# Patient Record
Sex: Male | Born: 1960 | Race: White | Hispanic: No | Marital: Single | Smoking: Former smoker
Health system: Southern US, Community
[De-identification: ages and names within clinical notes are randomized; demographics above are authoritative.]

## PROBLEM LIST (undated history)

## (undated) DIAGNOSIS — D693 Immune thrombocytopenic purpura: Secondary | ICD-10-CM

## (undated) DIAGNOSIS — T148XXA Other injury of unspecified body region, initial encounter: Secondary | ICD-10-CM

## (undated) DIAGNOSIS — M1990 Unspecified osteoarthritis, unspecified site: Secondary | ICD-10-CM

## (undated) DIAGNOSIS — I10 Essential (primary) hypertension: Principal | ICD-10-CM

## (undated) DIAGNOSIS — G4730 Sleep apnea, unspecified: Secondary | ICD-10-CM

## (undated) DIAGNOSIS — S42002A Fracture of unspecified part of left clavicle, initial encounter for closed fracture: Secondary | ICD-10-CM

## (undated) DIAGNOSIS — R7303 Prediabetes: Secondary | ICD-10-CM

## (undated) DIAGNOSIS — D759 Disease of blood and blood-forming organs, unspecified: Secondary | ICD-10-CM

## (undated) DIAGNOSIS — K219 Gastro-esophageal reflux disease without esophagitis: Secondary | ICD-10-CM

## (undated) DIAGNOSIS — W57XXXA Bitten or stung by nonvenomous insect and other nonvenomous arthropods, initial encounter: Secondary | ICD-10-CM

## (undated) DIAGNOSIS — H9393 Unspecified disorder of ear, bilateral: Secondary | ICD-10-CM

## (undated) DIAGNOSIS — E7800 Pure hypercholesterolemia, unspecified: Secondary | ICD-10-CM

## (undated) DIAGNOSIS — R252 Cramp and spasm: Secondary | ICD-10-CM

## (undated) HISTORY — PX: FRACTURE SURGERY: SHX138

## (undated) HISTORY — DX: Essential (primary) hypertension: I10

## (undated) HISTORY — DX: Prediabetes: R73.03

## (undated) HISTORY — DX: Immune thrombocytopenic purpura: D69.3

---

## 1998-03-16 ENCOUNTER — Ambulatory Visit (HOSPITAL_COMMUNITY): Admission: RE | Admit: 1998-03-16 | Discharge: 1998-03-16 | Payer: Self-pay | Admitting: Gastroenterology

## 2002-02-28 ENCOUNTER — Encounter (INDEPENDENT_AMBULATORY_CARE_PROVIDER_SITE_OTHER): Payer: Self-pay | Admitting: Specialist

## 2002-02-28 ENCOUNTER — Ambulatory Visit (HOSPITAL_COMMUNITY): Admission: RE | Admit: 2002-02-28 | Discharge: 2002-02-28 | Payer: Self-pay | Admitting: Gastroenterology

## 2002-04-27 ENCOUNTER — Encounter: Payer: Self-pay | Admitting: Emergency Medicine

## 2002-04-27 ENCOUNTER — Emergency Department (HOSPITAL_COMMUNITY): Admission: EM | Admit: 2002-04-27 | Discharge: 2002-04-27 | Payer: Self-pay | Admitting: Emergency Medicine

## 2006-10-12 ENCOUNTER — Emergency Department: Payer: Self-pay | Admitting: Emergency Medicine

## 2006-10-12 ENCOUNTER — Other Ambulatory Visit: Payer: Self-pay

## 2006-10-13 ENCOUNTER — Ambulatory Visit: Payer: Self-pay | Admitting: Emergency Medicine

## 2009-04-20 ENCOUNTER — Emergency Department (HOSPITAL_COMMUNITY): Admission: EM | Admit: 2009-04-20 | Discharge: 2009-04-20 | Payer: Self-pay | Admitting: Emergency Medicine

## 2009-08-18 HISTORY — PX: LUMBAR DISC SURGERY: SHX700

## 2009-11-14 ENCOUNTER — Emergency Department: Payer: Self-pay | Admitting: Emergency Medicine

## 2009-11-18 ENCOUNTER — Emergency Department (HOSPITAL_COMMUNITY): Admission: EM | Admit: 2009-11-18 | Discharge: 2009-11-19 | Payer: Self-pay | Admitting: Emergency Medicine

## 2010-07-18 HISTORY — PX: BACK SURGERY: SHX140

## 2010-07-24 ENCOUNTER — Observation Stay (HOSPITAL_COMMUNITY)
Admission: AD | Admit: 2010-07-24 | Discharge: 2010-07-26 | Payer: Self-pay | Source: Home / Self Care | Attending: Specialist | Admitting: Specialist

## 2010-07-25 ENCOUNTER — Encounter (INDEPENDENT_AMBULATORY_CARE_PROVIDER_SITE_OTHER): Payer: Self-pay | Admitting: Specialist

## 2010-09-08 ENCOUNTER — Encounter: Payer: Self-pay | Admitting: Internal Medicine

## 2010-10-29 LAB — URINALYSIS, MICROSCOPIC ONLY
Glucose, UA: NEGATIVE mg/dL
Leukocytes, UA: NEGATIVE
Nitrite: NEGATIVE
Protein, ur: NEGATIVE mg/dL

## 2010-10-29 LAB — PROTIME-INR: INR: 0.95 (ref 0.00–1.49)

## 2010-10-29 LAB — CBC
HCT: 42.2 % (ref 39.0–52.0)
Hemoglobin: 15 g/dL (ref 13.0–17.0)
MCH: 31 pg (ref 26.0–34.0)
MCHC: 35.5 g/dL (ref 30.0–36.0)

## 2010-10-29 LAB — BASIC METABOLIC PANEL
BUN: 9 mg/dL (ref 6–23)
Chloride: 108 mEq/L (ref 96–112)
GFR calc Af Amer: >60 mL/min (ref >60)

## 2010-11-06 LAB — COMPREHENSIVE METABOLIC PANEL
ALT: 31 U/L (ref 0–53)
AST: 32 U/L (ref 0–37)
Albumin: 4.3 g/dL (ref 3.5–5.2)
Alkaline Phosphatase: 69 U/L (ref 39–117)
GFR calc Af Amer: >60 mL/min (ref >60)
Glucose, Bld: 108 mg/dL — ABNORMAL HIGH (ref 70–99)
Potassium: 3.7 mEq/L (ref 3.5–5.1)
Sodium: 139 mEq/L (ref 135–145)
Total Protein: 7 g/dL (ref 6.0–8.3)

## 2010-11-06 LAB — URINALYSIS, ROUTINE W REFLEX MICROSCOPIC
Bilirubin Urine: NEGATIVE
Glucose, UA: NEGATIVE mg/dL
Hgb urine dipstick: NEGATIVE
Ketones, ur: NEGATIVE mg/dL
Protein, ur: NEGATIVE mg/dL
pH: 5.5 (ref 5.0–8.0)

## 2010-11-06 LAB — CBC
Hemoglobin: 14.8 g/dL (ref 13.0–17.0)
Platelets: 209 10*3/uL (ref 150–400)
RDW: 12.3 % (ref 11.5–15.5)

## 2010-12-04 NOTE — Discharge Summary (Signed)
  NAMEOCEAN, SCHILDT                  ACCOUNT NO.:  [id number]  MEDICAL RECORD NO.:  [id number]          PATIENT TYPE:  OBV  LOCATION:  1318                         FACILITY:  [facility]  PHYSICIAN:  Jene Every, M.D.    DATE OF BIRTH:  08/07/1961  DATE OF ADMISSION:  07/24/2010 DATE OF DISCHARGE:  07/26/2010                              DISCHARGE SUMMARY   ADMISSION DIAGNOSES: 1. Herniated nucleus pulposus of L5-S1. 2. Morbid obesity.  DISCHARGE DIAGNOSES: 1. Herniated nucleus pulposus of L5-S1, status post lumbar     decompression and microdiskectomy. 2. Morbid obesity.  PROCEDURE:  The patient was taken to the OR and underwent lateral recess decompression at L5-S1 on the right with microdiskectomy and removal of 3 free fragments at L5-S1 on the right.  Surgeon was Dr. Jene Every. Assistant was [employer], [employer].  Anesthesia was general. Complications were none.  HOSPITAL COURSE:  Uneventful.  CONDITION ON DISCHARGE:  Stable.  FINAL DIAGNOSIS:  Status post decompression microdiskectomy L5-S1.  The patient to follow up with Dr. Shelle Iron in 10 to 14 days postoperatively.  Wound care is to change dressing daily.  ACTIVITY:  Walk as tolerated utilizing back precautions.  MEDICATIONS:  As per med rec sheet.  CONDITION ON DISCHARGE:  Stable.     Roma Schanz, P.A.   ______________________________ Jene Every, M.D.   CS/MEDQ  D:  11/26/2010  T:  11/27/2010  Job:  [id number]  Electronically Signed by Roma Schanz P.A. on 11/27/2010 04:56:36 PM Electronically Signed by Jene Every M.D. on 12/04/2010 12:04:14 PM

## 2011-01-03 NOTE — Procedures (Signed)
[place]  Patient:    [name], [name] Visit Number: [id number] MRN: [id number]          Service Type: END Location: ENDO Attending Physician:  Nelda Marseille Dictated by:   Petra Kuba, M.D. Proc. Date: 02/28/02 Admit Date:  02/28/2002 Discharge Date: 02/28/2002   CC:         [facility]   Procedure Report  PROCEDURE PERFORMED:  Colonoscopy.  ENDOSCOPIST:  Petra Kuba, M.D.  INDICATIONS FOR PROCEDURE:  Patient with a history of colon polyps at a young age, due for repeat screening.  Consent was signed after risks, benefits, methods, and options were thoroughly discussed multiple times in the past with both the patient and his wife.  MEDICATIONS USED:  Demerol 120 mg, Versed 12 mg.  DESCRIPTION OF PROCEDURE:  Rectal inspection was pertinent for external hemorrhoids, small.  Digital exam was negative.  Video colonoscope was inserted, fairly easily advanced around the colon to the cecum.  This did require rolling him on his back and some abdominal pressure.  On insertion no obvious abnormality was seen.  The cecum by the appendiceal orifice and the ileocecal valve.  Prep was adequate.  There was some liquid stool that required washing and suctioning.  On slow withdrawal through the colon, a tiny cecal polyp was seen and was hot biopsied on a setting of 15/15.  The scope was slowly withdrawn to the approximate level of the splenic flexure.  A small pedunculated polyp was seen and snared, electrocautery applied and the polyp was suctioned through the scope and collected in the trap.  Two other descending polyps were seen and were hot biopsied times one and put in the same container with the approximate splenic flexure polyp.  In the more distal sigmoid, another small pedunculated polyp was seen and snared, electrocautery applied and the polyp was suctioned through the scope and collected in the trap.  This was put in a  third container.  Other than the cecal polypectomies, the rest were done on a setting of 20/20.  The scope was slowly withdrawn back to the rectum and retroflexed pertinent for some internal hemorrhoids.  There was a tiny hyperplastic appearing polyp on retroflexion which was hot biopsied times one and put in the third container.  The scope was straightened and readvanced a short ways up the left side of the colon.  Air was suctioned, scope removed.  The patient tolerated the procedure well.  There was no obvious immediate complication.  ENDOSCOPIC DIAGNOSIS: 1. Internal and external hemorrhoids. 2. Two tiny to small polyps with snares in the sigmoid, proximal descending,    hot biopsies in the cecum, rectum, descending times two. 3. Otherwise within normal limits to the cecum.  PLAN:  Await pathology to determine future colonic screening.  Happy to see back p.r.n.  Otherwise return care to [facility] for the customary health care maintenance to include yearly rectals and guaiacs. Dictated by:   Petra Kuba, M.D. Attending Physician:  Nelda Marseille DD:  02/28/02 TD:  03/02/02 Job: [id number]

## 2011-08-08 ENCOUNTER — Observation Stay (HOSPITAL_COMMUNITY)
Admission: EM | Admit: 2011-08-08 | Discharge: 2011-08-09 | DRG: 397 | Disposition: A | Discharge status: Home / Self Care | Payer: BC Managed Care – PPO | Attending: Internal Medicine | Admitting: Internal Medicine

## 2011-08-08 ENCOUNTER — Encounter: Payer: Self-pay | Admitting: *Deleted

## 2011-08-08 ENCOUNTER — Emergency Department (HOSPITAL_COMMUNITY): Payer: BC Managed Care – PPO

## 2011-08-08 ENCOUNTER — Other Ambulatory Visit: Payer: Self-pay

## 2011-08-08 DIAGNOSIS — R5383 Other fatigue: Secondary | ICD-10-CM | POA: Insufficient documentation

## 2011-08-08 DIAGNOSIS — D696 Thrombocytopenia, unspecified: Secondary | ICD-10-CM

## 2011-08-08 DIAGNOSIS — R252 Cramp and spasm: Secondary | ICD-10-CM

## 2011-08-08 DIAGNOSIS — R5381 Other malaise: Secondary | ICD-10-CM | POA: Insufficient documentation

## 2011-08-08 DIAGNOSIS — H9393 Unspecified disorder of ear, bilateral: Secondary | ICD-10-CM

## 2011-08-08 DIAGNOSIS — F17200 Nicotine dependence, unspecified, uncomplicated: Secondary | ICD-10-CM | POA: Diagnosis present

## 2011-08-08 DIAGNOSIS — E782 Mixed hyperlipidemia: Secondary | ICD-10-CM | POA: Diagnosis present

## 2011-08-08 DIAGNOSIS — Z7982 Long term (current) use of aspirin: Secondary | ICD-10-CM | POA: Insufficient documentation

## 2011-08-08 DIAGNOSIS — T148XXA Other injury of unspecified body region, initial encounter: Secondary | ICD-10-CM

## 2011-08-08 DIAGNOSIS — W57XXXA Bitten or stung by nonvenomous insect and other nonvenomous arthropods, initial encounter: Secondary | ICD-10-CM

## 2011-08-08 DIAGNOSIS — Z79899 Other long term (current) drug therapy: Secondary | ICD-10-CM | POA: Insufficient documentation

## 2011-08-08 DIAGNOSIS — R0789 Other chest pain: Secondary | ICD-10-CM | POA: Insufficient documentation

## 2011-08-08 DIAGNOSIS — K219 Gastro-esophageal reflux disease without esophagitis: Secondary | ICD-10-CM | POA: Insufficient documentation

## 2011-08-08 DIAGNOSIS — E785 Hyperlipidemia, unspecified: Secondary | ICD-10-CM | POA: Diagnosis present

## 2011-08-08 DIAGNOSIS — D693 Immune thrombocytopenic purpura: Principal | ICD-10-CM | POA: Diagnosis present

## 2011-08-08 HISTORY — DX: Pure hypercholesterolemia, unspecified: E78.00

## 2011-08-08 HISTORY — DX: Morbid (severe) obesity due to excess calories: E66.01

## 2011-08-08 HISTORY — DX: Other injury of unspecified body region, initial encounter: T14.8XXA

## 2011-08-08 HISTORY — DX: Unspecified osteoarthritis, unspecified site: M19.90

## 2011-08-08 HISTORY — DX: Cramp and spasm: R25.2

## 2011-08-08 HISTORY — DX: Gastro-esophageal reflux disease without esophagitis: K21.9

## 2011-08-08 HISTORY — DX: Bitten or stung by nonvenomous insect and other nonvenomous arthropods, initial encounter: W57.XXXA

## 2011-08-08 HISTORY — DX: Unspecified disorder of ear, bilateral: H93.93

## 2011-08-08 HISTORY — DX: Sleep apnea, unspecified: G47.30

## 2011-08-08 LAB — COMPREHENSIVE METABOLIC PANEL
AST: 17 U/L (ref 0–37)
BUN: 11 mg/dL (ref 6–23)
CO2: 27 mEq/L (ref 19–32)
Calcium: 9.3 mg/dL (ref 8.4–10.5)
Chloride: 104 mEq/L (ref 96–112)
Creatinine, Ser: 0.96 mg/dL (ref 0.50–1.35)
GFR calc non Af Amer: >90 mL/min (ref >90)
Total Bilirubin: 0.3 mg/dL (ref 0.3–1.2)

## 2011-08-08 LAB — DIC (DISSEMINATED INTRAVASCULAR COAGULATION)PANEL
INR: 1.06 (ref 0.00–1.49)
Prothrombin Time: 14 seconds (ref 11.6–15.2)
aPTT: 38 seconds — ABNORMAL HIGH (ref 24–37)

## 2011-08-08 LAB — URINALYSIS, ROUTINE W REFLEX MICROSCOPIC
Bilirubin Urine: NEGATIVE
Ketones, ur: NEGATIVE mg/dL
Leukocytes, UA: NEGATIVE
Nitrite: NEGATIVE
Protein, ur: NEGATIVE mg/dL
pH: 6 (ref 5.0–8.0)

## 2011-08-08 LAB — DIFFERENTIAL
Basophils Absolute: 0 10*3/uL (ref 0.0–0.1)
Basophils Relative: 1 % (ref 0–1)
Eosinophils Relative: 2 % (ref 0–5)
Monocytes Absolute: 0.5 10*3/uL (ref 0.1–1.0)
Monocytes Relative: 6 % (ref 3–12)

## 2011-08-08 LAB — CARDIAC PANEL(CRET KIN+CKTOT+MB+TROPI): Troponin I: <0.3 ng/mL (ref <0.30)

## 2011-08-08 LAB — LACTATE DEHYDROGENASE: LDH: 134 U/L (ref 94–250)

## 2011-08-08 LAB — CBC
HCT: 38.6 % — ABNORMAL LOW (ref 39.0–52.0)
Hemoglobin: 13.2 g/dL (ref 13.0–17.0)
MCHC: 34.2 g/dL (ref 30.0–36.0)
MCV: 87.5 fL (ref 78.0–100.0)
RDW: 12.3 % (ref 11.5–15.5)

## 2011-08-08 LAB — TYPE AND SCREEN: Antibody Screen: NEGATIVE

## 2011-08-08 LAB — PROTIME-INR
INR: 0.98 (ref 0.00–1.49)
Prothrombin Time: 13.2 seconds (ref 11.6–15.2)

## 2011-08-08 LAB — HIV ANTIBODY (ROUTINE TESTING W REFLEX): HIV: NONREACTIVE

## 2011-08-08 MED ORDER — SODIUM CHLORIDE 0.9 % IV SOLN
250.0000 mL | INTRAVENOUS | Status: DC | PRN
  Administered 2011-08-08: 1000 mL via INTRAVENOUS

## 2011-08-08 MED ORDER — ALPRAZOLAM 0.5 MG PO TABS
0.5000 mg | ORAL_TABLET | Freq: Every evening | ORAL | Status: DC | PRN
  Administered 2011-08-08: 0.5 mg via ORAL
  Filled 2011-08-08: qty 1

## 2011-08-08 MED ORDER — SENNOSIDES-DOCUSATE SODIUM 8.6-50 MG PO TABS
1.0000 | ORAL_TABLET | Freq: Every evening | ORAL | Status: DC | PRN
  Filled 2011-08-08: qty 1

## 2011-08-08 MED ORDER — SODIUM CHLORIDE 0.9 % IJ SOLN
3.0000 mL | Freq: Two times a day (BID) | INTRAMUSCULAR | Status: DC
  Administered 2011-08-08 – 2011-08-09 (×2): 3 mL via INTRAVENOUS

## 2011-08-08 MED ORDER — PREDNISONE 50 MG PO TABS
100.0000 mg | ORAL_TABLET | Freq: Every day | ORAL | Status: DC
  Administered 2011-08-08 – 2011-08-09 (×2): 100 mg via ORAL
  Filled 2011-08-08 (×3): qty 2

## 2011-08-08 MED ORDER — PANTOPRAZOLE SODIUM 40 MG PO TBEC
40.0000 mg | DELAYED_RELEASE_TABLET | Freq: Every day | ORAL | Status: DC
  Administered 2011-08-08 – 2011-08-09 (×2): 40 mg via ORAL
  Filled 2011-08-08 (×3): qty 1

## 2011-08-08 MED ORDER — SODIUM CHLORIDE 0.9 % IJ SOLN: 3.0000 mL | INTRAMUSCULAR | Status: DC | PRN

## 2011-08-08 MED ORDER — ONDANSETRON HCL 4 MG/2ML IJ SOLN: 4.0000 mg | Freq: Four times a day (QID) | INTRAMUSCULAR | Status: DC | PRN

## 2011-08-08 MED ORDER — ACETAMINOPHEN 325 MG PO TABS: 650.0000 mg | ORAL_TABLET | Freq: Four times a day (QID) | ORAL | Status: DC | PRN

## 2011-08-08 MED ORDER — ROSUVASTATIN CALCIUM 40 MG PO TABS
40.0000 mg | ORAL_TABLET | Freq: Every day | ORAL | Status: DC
  Administered 2011-08-08: 40 mg via ORAL
  Filled 2011-08-08 (×4): qty 1

## 2011-08-08 MED ORDER — OXYCODONE HCL 5 MG PO TABS: 5.0000 mg | ORAL_TABLET | ORAL | Status: DC | PRN

## 2011-08-08 MED ORDER — ONDANSETRON HCL 4 MG PO TABS: 4.0000 mg | ORAL_TABLET | Freq: Four times a day (QID) | ORAL | Status: DC | PRN

## 2011-08-08 MED ORDER — ACETAMINOPHEN 650 MG RE SUPP: 650.0000 mg | Freq: Four times a day (QID) | RECTAL | Status: DC | PRN

## 2011-08-08 NOTE — ED Notes (Signed)
Pt states that he has noticed an increase in his fatigue in the last month or so. He states he hunts in the am, and that he never has taken naps, but recently he states he falls asleep in the late afternoon which is out of character. He works in the [employer].

## 2011-08-08 NOTE — Consult Note (Signed)
Reason for consult:Thrombocytopenia, r/o ITP Consulting MD: Dr. Gaylyn Rong Date of Consultation: 08/08/2011  HPI: [name] is a 50 year old white man with past medical history only significant for hyperlipidemia, called today by his PCP office, [name]  to come to the emergency department after he was found to have a platelet count of 16,000. (prior plt count per pt report was 156) Of note, pt had been seen recently for a "ear infection", and "pain in the right neck with a lump that went away" along with "right ear bleeding after using a Q-tip" for which he states he received steroids. He does not recall if antibiotics were given at the time.He has been  has been more fatigued recently and has noticed several large bruises in the extremities over the last 2 months.  He states that 3 weeks ago while he was out hunting dear he cut his left forearm and bled for 3 days. Pt has experienced several ticks over the last few moths during hunting, most recently over 4 weeks ago.  Denies any fever or chills. No night sweats.  No new areas of swelling noted by patient. He does have a 100 lb intentional weight loss over the last year. Occasional ETOH binger, last consumption 12/20, approximately 12 pack.  Pt is being seen at the ED for evaluation of low platelet count. Of note, patient takes daily baby ASA, intermittent Advil. Denies any risk for hepatitis or HIV (has never been tested)  Smear shows no abnormalities, in particular no schistocytes.    PMH: Past Medical History  Diagnosis Date  . Acid reflux   . Hypercholesteremia   . Morbid obesity   . Bruise 08/08/11    pt states brusing x 1 month  . Tick bites 08/08/11    60 to 70 ticks in last hunting season , last > 1 month  . 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 Chest pain 08/08/2011    "a little in last month"  . Leg cramps 08/08/2011    bad in last 1.5 months    Surgeries: Past  Surgical History  Procedure Date  . Back surgery 07/2010    lumbar - 3 discs    Allergies:  Allergies  Allergen Reactions  . Penicillins Other (See Comments)    Unknown childhood allergy    Medications:   Scheduled:   . pantoprazole  40 mg Oral Q1200  . rosuvastatin  40 mg Oral Daily  . sodium chloride  3 mL Intravenous Q12H   WUJ:WJXBJY chloride, acetaminophen, acetaminophen, ondansetron (ZOFRAN) IV, ondansetron, oxyCODONE, senna-docusate, sodium chloride  NWG:NFAOZH of Systems  Constitutional: Positive for weight loss 100 lbs, intentional. Negative for fever, chills and malaise. More fatigued than usual.  Eyes: Negative for blurred vision and double vision.  Respiratory: Negative for cough, hemoptysis and shortness of breath.  Cardiovascular: Negative for chest pain. GI: No nausea, vomiting, diarrhea, constipation. No change in bowel caliber. No  Melena or  Hematochezia.  GU: No blood in urine. No loss of urinary control. Skin: Negative for itching. No rash. No petechia.Bruising as in HPI Neurological: No headaches. No motor or sensory deficits.  Rest of the positive ROS as per HPI   Family History: Family History  Problem Relation Age of Onset  . Uterine cancer Mother   . Uterine cancer Sister   Father alive, "easy bruising"  Social History:  reports that he has  been smoking about 1 ppd "on and off" .  His smokeless tobacco use includes Chew. He reports that he drinks alcohol, last binge with 12 ppack on 12/20. He reports that he does not use illicit drugs. Pt divorced. No children. One partner relationship. Unprotected sex. Total of 18 siblings.   Physical Exam  50 y.o. male in no acute distress  A. and O. x3 HEENT: Normocephalic, atraumatic, PERRLA. Oral cavity without thrush or lesions.Wears dentures. Neck: supple. no thyromegaly,R shotty cervical l.n. No supraclavicular adenopathy  Lungs: clear bilaterally . No wheezing, rhonchi or rales. No axillary  masses. Breasts: not examined. Cardiac: regular rate and rhythm normal S1-S2, no murmur , rubs or gallops Abdomen: soft nontender , bowel sounds x4  no hepatosplenomegaly GU/rectal: deferred. Extremities: no clubbing cyanosis . No edema. R thigh large 8 by 6 cm bruise. No other bruising visible.  Neurologic: A. and O. x3, no focal deficits       Labs  CBC  Lab 08/08/11 1245  WBC 8.1  HGB 13.2  HCT 38.6*  PLT 19*  MCV 87.5  MCH 29.9  MCHC 34.2  RDW 12.3  LYMPHSABS 1.9  MONOABS 0.5  EOSABS 0.2  BASOSABS 0.0  BANDABS --    CMP    Lab 08/08/11 1245  NA 139  K 4.2  CL 104  CO2 27  GLUCOSE 83  BUN 11  CREATININE 0.96  CALCIUM 9.3  MG --  AST 17  ALT 20  ALKPHOS 97  BILITOT 0.3        Component Value Date/Time   BILITOT 0.3 08/08/2011 1245     Lab 08/08/11 1245  INR 0.98  PROTIME --    Imaging Studies: Dg Chest 2 View  08/08/2011  *RADIOLOGY REPORT*  Clinical Data: 50 year old male with weakness.  CHEST - 2 VIEW  Comparison: 07/24/2010 and earlier.  Findings: Improved lung volumes.  Cardiac size and mediastinal contours are within normal limits.  Visualized tracheal air column is within normal limits.  No pneumothorax, pulmonary edema, pleural effusion or confluent pulmonary opacity.  Negative visualized bowel gas pattern. No acute osseous abnormality identified.  IMPRESSION: No acute cardiopulmonary abnormality.  Original Report Authenticated By: Harley Hallmark, M.D.       A/P: 50 y.o. male asked to see for evaluation of thrombocytopenia, likely ITP . Workup in progress. Dr.  Gaylyn Rong   is to see the patient following this consult with recommendations regarding diagnosis, treatment options and further workup studies.    [facility] E 08/08/2011 4:22 PM  ================================  Attending's Attestation.  ATTENDING'S ATTESTATION:  I personally reviewed patient's chart, examined patient myself, formulated the treatment plan as followed.      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  Another much less likely possibility includes recent antibiotics use (x2); with last dose less than a month ago.   His WBC/Hgb are normal as are his CMET.  He has unprotected sexual intercourse with only one male partner.  I personally reviewed the patient's peripheral blood smear today.  There was isocytosis.  There was no peripheral blast.  There was no schistocytosis, spherocytosis, target cell, rouleaux formation, tear drop cell.  There was no giant platelets or platelet clumps.   There was very low platelet counts.   He has lost about 100-lb intentionally over the past year.  He however has no B symptoms.  The subjective easy bruisability does not have clinical correlate since he had extensive back surgery 07/2010 without bleeding diathesis. He claimed recent right  cervical neck node swelling which was most likely related to his right ear infection.  There was no adenopathy on exam today. He drinks EtOH occasionally but no LFT abnormality or splenomegaly to suggest portal hypertension. I have low clinical suspicion for primary bone marrow failure state with isolated thrombocytopenia and no B symptoms.  There is no concern for MAHA/TTP on clinical/lab/blood smear review.  Diagnostic bone marrow at this time is of low clinical utility unless he does not improve with emperic treatment for ITP.   RECOMMEND: -  I sent for HIV/Hep B/Hep C/Vit B12/folate.  I sent for PTT/DIC panel to complete work up due to his report of easy bruisability.  - Start Prednisone 1mg /kg (calculated to be 100mg ) PO daily.  I discussed with patient side effects of Prednisone which include but not limited to fluid retention, insomnia, GERD, PUD and GI bleeding.  If he has no response to Prednisone within 4-5 days, other measures such as IVIg or Rituxan maybe consdered.  - Keep inpatient status until Plt improves around 50. - When D/C; please contact [facility] for follow up.  Thank you  for this consultation.

## 2011-08-08 NOTE — ED Notes (Signed)
ZOX:[id number]<AV> Expected date:<BR> Expected time:<BR> Means of arrival:<BR> Comments:<BR> [name] coming from home md office called and pt has low plts of 16 private vehicle

## 2011-08-08 NOTE — ED Notes (Signed)
Pt sts he is here for low platelet (value is 16). Pt is eating right now, nad noted. ABC intact. Denied pain. No sob. Alert, and oriented. Will continue to monitor.

## 2011-08-08 NOTE — ED Provider Notes (Signed)
History     CSN: [id number]  Arrival date & time 08/08/11  1131   First MD Initiated Contact with Patient 08/08/11 1215      Chief Complaint  Patient presents with  . Abnormal Lab    low platelets    (Consider location/radiation/quality/duration/timing/severity/associated sxs/prior treatment) HPI Comments: Patient presents with abnormal lab value low platelets. He visited his doctor yesterday for annual physical and management he's been having easy bruising for about the past week.  He was found to have platelet level of 16.   She denies any gingival bleeding, blood in his stool, blood in his urine. Denies abdominal pain, chest pain, nausea or vomiting. Recently he has had increasing fatigue over the past month and taking naps which she doesn't usually do.   Is not take any medications other than his cholesterol and stomach pill.  The history is provided by the patient.    Past Medical History  Diagnosis Date  . Acid reflux   . Hypercholesteremia   . Morbid obesity   . Bruise 08/08/11    pt states brusing x 1 month  . Tick bites 08/08/11    60 to 70 ticks in last hunting season , last > 1 month  . 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 Chest pain 08/08/2011    "a little in last month"  . Leg cramps 08/08/2011    bad in last 1.5 months    Past Surgical History  Procedure Date  . Back surgery 07/2010    lumbar - 3 discs    Family History  Problem Relation Age of Onset  . Uterine cancer Mother   . Uterine cancer Sister     History  Substance Use Topics  . Smoking status: Current Everyday Smoker -- 0.5 packs/day for 2 years  . Smokeless tobacco: Current User    Types: Chew  . Alcohol Use: Yes     drank 12 beers last night on 08/07/11, pt states rarely drinks      Review of Systems  Constitutional: Positive for fatigue. Negative for fever, activity change and appetite change.  HENT: Negative  for congestion and rhinorrhea.   Eyes: Negative for visual disturbance.  Respiratory: Negative for cough and shortness of breath.   Cardiovascular: Negative for chest pain.  Gastrointestinal: Negative for nausea, vomiting and abdominal pain.  Genitourinary: Negative for dysuria and hematuria.  Musculoskeletal: Negative for arthralgias and gait problem.  Skin: Positive for wound.  Neurological: Positive for weakness. Negative for dizziness and headaches.    Allergies  Penicillins  Home Medications   Current Outpatient Rx  Name Route Sig Dispense Refill  . XANAX PO Oral Take 1 mg by mouth 3 (three) times daily as needed.     . ASPIRIN EC 81 MG PO TBEC Oral Take 81 mg by mouth daily.      . ATORVASTATIN CALCIUM 80 MG PO TABS Oral Take 80 mg by mouth daily.      Marland Kitchen NAPROXEN SODIUM 220 MG PO TABS Oral Take 220 mg by mouth 2 (two) times daily with a meal.      . OMEPRAZOLE 40 MG PO CPDR Oral Take 40 mg by mouth daily.        BP 117/73  Pulse 62  Temp(Src) 97.7 F (36.5 C) (Oral)  Resp 20  Wt 218 lb (98.884 kg)  SpO2 99%  Physical Exam  Constitutional:  He is oriented to person, place, and time. He appears well-developed and well-nourished. No distress.  HENT:  Head: Normocephalic and atraumatic.  Mouth/Throat: Oropharynx is clear and moist. No oropharyngeal exudate.       No oral lesions, no gingival bleeding  Eyes: Conjunctivae are normal. Pupils are equal, round, and reactive to light.  Neck: Normal range of motion. Neck supple.  Cardiovascular: Normal rate, regular rhythm and normal heart sounds.   Pulmonary/Chest: Effort normal and breath sounds normal. No respiratory distress.  Abdominal: Soft. There is no tenderness. There is no rebound and no guarding.  Musculoskeletal: Normal range of motion. He exhibits no edema and no tenderness.  Neurological: He is alert and oriented to person, place, and time. No cranial nerve deficit.  Skin: Skin is warm.       Bruise to right  anterior thigh that is at least a week old No petechiae    ED Course  Procedures (including critical care time)  Labs Reviewed  CBC - Abnormal; Notable for the following:    HCT 38.6 (*)    Platelets 19 (*)    All other components within normal limits  DIC PANEL - Abnormal; Notable for the following:    aPTT 38 (*)    Platelets 19 (*)    All other components within normal limits  DIFFERENTIAL  COMPREHENSIVE METABOLIC PANEL  PROTIME-INR  URINALYSIS, ROUTINE W REFLEX MICROSCOPIC  CARDIAC PANEL(CRET KIN+CKTOT+MB+TROPI)  TYPE AND SCREEN  LACTATE DEHYDROGENASE  HIV ANTIBODY (ROUTINE TESTING)  HEPATITIS PANEL, ACUTE  ANA  PATHOLOGIST SMEAR REVIEW  BASIC METABOLIC PANEL  CBC  LIPID PANEL  VITAMIN B12  FOLATE  HEPATITIS PANEL, ACUTE  HIV ANTIBODY (ROUTINE TESTING)   Dg Chest 2 View  08/08/2011  *RADIOLOGY REPORT*  Clinical Data: 50 year old male with weakness.  CHEST - 2 VIEW  Comparison: 07/24/2010 and earlier.  Findings: Improved lung volumes.  Cardiac size and mediastinal contours are within normal limits.  Visualized tracheal air column is within normal limits.  No pneumothorax, pulmonary edema, pleural effusion or confluent pulmonary opacity.  Negative visualized bowel gas pattern. No acute osseous abnormality identified.  IMPRESSION: No acute cardiopulmonary abnormality.  Original Report Authenticated By: Ulla Potash III, M.D.     1. Thrombocytopenia       MDM  Thrombocytopenia without any active bleeding. No history of alcohol abuse, liver problems, kidney problems.  No petechiae on exam. Very well-appearing on exam.  Labs today confirm thrombocytopenia. Normal hemoglobin, normal coags.  Concern for ITP. Normal renal function and normal mental status do not suggest TTP.  D/w Dr. Gaylyn Rong of hematology who will consult on patient.  Admit to triad, Dr. Ardyth Harps.      Date: 08/08/2011  Rate: 70  Rhythm: normal sinus rhythm  QRS Axis: normal  Intervals: normal  ST/T  Wave abnormalities: normal  Conduction Disutrbances:none  Narrative Interpretation:   Old EKG Reviewed: none available    Glynn Octave, MD 08/08/11 1918

## 2011-08-08 NOTE — H&P (Addendum)
PCP:   Nadean Corwin, MD, MD   Chief Complaint:  Bruising and low platelet count.  HPI: Patient is a very pleasant 50 year old white man with past medical history only significant for hyperlipidemia, who went to see his primary care physician yesterday for his six-month physical and was called today to come to the emergency department because his platelet count was 16,000. Upon further questioning he states that he has been more fatigued recently and that he has had several bruises without injury. He did note that about 2 months ago he started bleeding out of his ear after he used a Q-tip and also 3 weeks ago while he was out hunting dear he cut his left forearm and bled for 3 days. Because of his platelet count we are asked to admit him for further evaluation.  Allergies:   Allergies  Allergen Reactions  . Penicillins Other (See Comments)    Unknown childhood allergy      Past Medical History  Diagnosis Date  . Acid reflux   . Hypercholesteremia   . Morbid obesity     Past Surgical History  Procedure Date  . Back surgery 07/2010    lumbar    Prior to Admission medications   Medication Sig Start Date End Date Taking? Authorizing Provider  ALPRAZolam (XANAX PO) Take 1 mg by mouth 3 (three) times daily as needed.    Yes Historical Provider, MD  aspirin EC 81 MG tablet Take 81 mg by mouth daily.     Yes Historical Provider, MD  atorvastatin (LIPITOR) 80 MG tablet Take 80 mg by mouth daily.     Yes Historical Provider, MD  naproxen sodium (ANAPROX) 220 MG tablet Take 220 mg by mouth 2 (two) times daily with a meal.     Yes Historical Provider, MD  omeprazole (PRILOSEC) 40 MG capsule Take 40 mg by mouth daily.     Yes Historical Provider, MD    Social History:  reports that he has been smoking.  He does not have any smokeless tobacco history on file. He reports that he drinks alcohol. He reports that he does not use illicit drugs.  History reviewed. No pertinent family  history.  Review of Systems:  Negative except as mentioned in history of present illness.  Physical Exam: Blood pressure 115/72, pulse 81, temperature 98 F (36.7 C), temperature source Oral, resp. rate 17, weight 98.884 kg (218 lb), SpO2 97.00%. General: Alert, awake, oriented x3, in no acute distress. HEENT: Normocephalic, atraumatic, pupils equal round reactive to light, intact extraocular movements, he has no natural teeth. Neck: Supple, no JVD, no lymphadenopathy, no bruits, no goiter. Cardiovascular: Regular rate and rhythm, no murmurs, rubs or gallops. Lungs: Clear to auscultation bilaterally. Abdomen: Soft, nontender, nondistended, positive bowel sounds, no masses or organomegaly noted. Extremities: No clubbing, cyanosis or edema, positive pedal pulses. Neurologic: Grossly intact and nonfocal. Skin: he has multiple bruises in different phases of healing over her right shoulder right thigh left inner calf.  Labs on Admission:  Results for orders placed during the hospital encounter of 08/08/11 (from the past 48 hour(s))  CBC     Status: Abnormal   Collection Time   08/08/11 12:45 PM      Component Value Range Comment   WBC 8.1  4.0 - 10.5 (K/uL)    RBC 4.41  4.22 - 5.81 (MIL/uL)    Hemoglobin 13.2  13.0 - 17.0 (g/dL)    HCT 95.2 (*) 84.1 - 52.0 (%)  MCV 87.5  78.0 - 100.0 (fL)    MCH 29.9  26.0 - 34.0 (pg)    MCHC 34.2  30.0 - 36.0 (g/dL)    RDW 16.1  09.6 - 04.5 (%)    Platelets 19 (*) 150 - 400 (K/uL)   DIFFERENTIAL     Status: Normal   Collection Time   08/08/11 12:45 PM      Component Value Range Comment   Neutrophils Relative 69  43 - 77 (%)    Neutro Abs 5.5  1.7 - 7.7 (K/uL)    Lymphocytes Relative 23  12 - 46 (%)    Lymphs Abs 1.9  0.7 - 4.0 (K/uL)    Monocytes Relative 6  3 - 12 (%)    Monocytes Absolute 0.5  0.1 - 1.0 (K/uL)    Eosinophils Relative 2  0 - 5 (%)    Eosinophils Absolute 0.2  0.0 - 0.7 (K/uL)    Basophils Relative 1  0 - 1 (%)     Basophils Absolute 0.0  0.0 - 0.1 (K/uL)   COMPREHENSIVE METABOLIC PANEL     Status: Normal   Collection Time   08/08/11 12:45 PM      Component Value Range Comment   Sodium 139  135 - 145 (mEq/L)    Potassium 4.2  3.5 - 5.1 (mEq/L)    Chloride 104  96 - 112 (mEq/L)    CO2 27  19 - 32 (mEq/L)    Glucose, Bld 83  70 - 99 (mg/dL)    BUN 11  6 - 23 (mg/dL)    Creatinine, Ser 4.09  0.50 - 1.35 (mg/dL)    Calcium 9.3  8.4 - 10.5 (mg/dL)    Total Protein 6.5  6.0 - 8.3 (g/dL)    Albumin 3.7  3.5 - 5.2 (g/dL)    AST 17  0 - 37 (U/L)    ALT 20  0 - 53 (U/L)    Alkaline Phosphatase 97  39 - 117 (U/L)    Total Bilirubin 0.3  0.3 - 1.2 (mg/dL)    GFR calc non Af Amer >90  >90 (mL/min)    GFR calc Af Amer >90  >90 (mL/min)   PROTIME-INR     Status: Normal   Collection Time   08/08/11 12:45 PM      Component Value Range Comment   Prothrombin Time 13.2  11.6 - 15.2 (seconds)    INR 0.98  0.00 - 1.49    CARDIAC PANEL(CRET KIN+CKTOT+MB+TROPI)     Status: Normal   Collection Time   08/08/11 12:45 PM      Component Value Range Comment   Total CK 92  7 - 232 (U/L)    CK, MB 2.5  0.3 - 4.0 (ng/mL)    Troponin I <0.30  <0.30 (ng/mL)    Relative Index RELATIVE INDEX IS INVALID  0.0 - 2.5    TYPE AND SCREEN     Status: Normal   Collection Time   08/08/11 12:45 PM      Component Value Range Comment   ABO/RH(D) O POS      Antibody Screen NEG      Sample Expiration 08/11/2011     URINALYSIS, ROUTINE W REFLEX MICROSCOPIC     Status: Normal   Collection Time   08/08/11  1:33 PM      Component Value Range Comment   Color, Urine YELLOW  YELLOW     APPearance CLEAR  CLEAR  Specific Gravity, Urine 1.013  1.005 - 1.030     pH 6.0  5.0 - 8.0     Glucose, UA NEGATIVE  NEGATIVE (mg/dL)    Hgb urine dipstick NEGATIVE  NEGATIVE     Bilirubin Urine NEGATIVE  NEGATIVE     Ketones, ur NEGATIVE  NEGATIVE (mg/dL)    Protein, ur NEGATIVE  NEGATIVE (mg/dL)    Urobilinogen, UA 1.0  0.0 - 1.0 (mg/dL)     Nitrite NEGATIVE  NEGATIVE     Leukocytes, UA NEGATIVE  NEGATIVE  MICROSCOPIC NOT DONE ON URINES WITH NEGATIVE PROTEIN, BLOOD, LEUKOCYTES, NITRITE, OR GLUCOSE <1000 mg/dL.    Radiological Exams on Admission: Dg Chest 2 View  08/08/2011  *RADIOLOGY REPORT*  Clinical Data: 50 year old male with weakness.  CHEST - 2 VIEW  Comparison: 07/24/2010 and earlier.  Findings: Improved lung volumes.  Cardiac size and mediastinal contours are within normal limits.  Visualized tracheal air column is within normal limits.  No pneumothorax, pulmonary edema, pleural effusion or confluent pulmonary opacity.  Negative visualized bowel gas pattern. No acute osseous abnormality identified.  IMPRESSION: No acute cardiopulmonary abnormality.  Original Report Authenticated By: Harley Hallmark, M.D.    Assessment/Plan Principal Problem:  *Thrombocytopenia Active Problems:  Hyperlipidemia   #1 thrombocytopenia: He does not have anemia or leukopenia. He has no findings concerning for TTP. We'll order an LDH and a peripheral blood smear. Will also add an HIV, hepatitis C and ANA. 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 Gaylyn Rong with hematology has already been consulted by the emergency department physician and will be in to see patient. Will leave further recommendations to him.  #2 hyperlipidemia: Check fasting lipid panel, continue statin.  #3 DVT prophylaxis: SCDs.  Time Spent on Admission: 40 minutes.  Chaya Jan [facility] Pager: [phone] 08/08/2011, 3:53 PM

## 2011-08-08 NOTE — ED Notes (Signed)
Family at bedside. 

## 2011-08-08 NOTE — ED Notes (Signed)
Vital signs stable. 

## 2011-08-08 NOTE — ED Notes (Signed)
Patient denies pain and is resting comfortably.  

## 2011-08-08 NOTE — ED Notes (Signed)
Pt states "I've been bruising easily, my doctor called me this morning & told me I had low platelets & come to the hospital"

## 2011-08-08 NOTE — ED Notes (Signed)
Patient is resting comfortably. 

## 2011-08-09 ENCOUNTER — Other Ambulatory Visit: Payer: Self-pay | Admitting: Oncology

## 2011-08-09 DIAGNOSIS — E785 Hyperlipidemia, unspecified: Secondary | ICD-10-CM

## 2011-08-09 DIAGNOSIS — D696 Thrombocytopenia, unspecified: Secondary | ICD-10-CM

## 2011-08-09 LAB — BASIC METABOLIC PANEL
CO2: 22 mEq/L (ref 19–32)
Calcium: 9.4 mg/dL (ref 8.4–10.5)
GFR calc Af Amer: >90 mL/min (ref >90)
GFR calc non Af Amer: >90 mL/min (ref >90)
Sodium: 136 mEq/L (ref 135–145)

## 2011-08-09 LAB — HEPATITIS PANEL, ACUTE
HCV Ab: NEGATIVE
Hep A IgM: NEGATIVE
Hep B C IgM: NEGATIVE

## 2011-08-09 LAB — CBC
MCV: 86.6 fL (ref 78.0–100.0)
Platelets: 24 10*3/uL — CL (ref 150–400)
RBC: 4.48 MIL/uL (ref 4.22–5.81)
WBC: 9.2 10*3/uL (ref 4.0–10.5)

## 2011-08-09 LAB — LIPID PANEL
Cholesterol: 97 mg/dL (ref 0–200)
HDL: 33 mg/dL — ABNORMAL LOW (ref >39)
Total CHOL/HDL Ratio: 2.9 RATIO
Triglycerides: 44 mg/dL (ref <150)

## 2011-08-09 LAB — ABO/RH: ABO/RH(D): O POS

## 2011-08-09 MED ORDER — PREDNISONE 50 MG PO TABS: 100.0000 mg | ORAL_TABLET | Freq: Every day | ORAL | Status: DC

## 2011-08-09 NOTE — Progress Notes (Signed)
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 Feeling well,no complaints,no feversetc.  Objective: Temp:  [96.8 F (36 C)-98.5 F (36.9 C)] 98 F (36.7 C) (12/22 0542) Pulse Rate:  [62-88] 88  (12/22 0542) Resp:  [16-20] 20  (12/22 0542) BP: (115-135)/(57-83) 135/79 mmHg (12/22 0542) SpO2:  [96 %-99 %] 96 % (12/22 0542) Weight:  [218 lb (98.884 kg)] 218 lb (98.884 kg) (12/21 1955)  General appearance: alert, cooperative and appears stated age Throat: lips, mucosa, and tongue normal; teeth and gums normal Resp: clear to auscultation bilaterally and normal percussion bilaterally Cardio: regular rate and rhythm, S1, S2 normal, no murmur, click, rub or gallop and normal apical impulse Extremities: extremities normal, atraumatic, no cyanosis or edema  Labs:  [facility] 08/09/11 0342 08/08/11 1745 08/08/11 1245  WBC 9.2 -- 8.1  HGB 13.5 -- 13.2  HCT 38.8* -- 38.6*  PLT 24* 19* --    Basename 08/09/11 0342 08/08/11 1245  NA 136 139  K 4.2 4.2  CL 104 104  CO2 22 27  GLUCOSE 138* 83  BUN 16 11  CREATININE 0.81 0.96  CALCIUM 9.4 9.3    Results for orders placed during the hospital encounter of 07/24/10  MRSA PCR SCREENING     Status: Normal   Collection Time   07/24/10 10:54 PM      Component Value Range Status Comment   MRSA by PCR    NEGATIVE  Final    Value: NEGATIVE            The GeneXpert MRSA Assay (FDA     approved for NASAL specimens     only), is one component of a     comprehensive MRSA colonization     surveillance program. It is not     intended to diagnose MRSA     infection nor to guide or     monitor treatment for     MRSA infections.     Dg Chest 2 View  08/08/2011  *RADIOLOGY REPORT*  Clinical Data: 50 year old male with weakness.  CHEST - 2 VIEW  Comparison: 07/24/2010 and earlier.  Findings: Improved lung volumes.  Cardiac size and mediastinal contours are within normal limits.   Visualized tracheal air column is within normal limits.  No pneumothorax, pulmonary edema, pleural effusion or confluent pulmonary opacity.  Negative visualized bowel gas pattern. No acute osseous abnormality identified.  IMPRESSION: No acute cardiopulmonary abnormality.  Original Report Authenticated By: Harley Hallmark, M.D.  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

## 2011-08-09 NOTE — Progress Notes (Signed)
Pt discharged to home. Discharge instructions reviewed with pt who verbalized understanding, new RX given to pt.

## 2011-08-09 NOTE — Discharge Summary (Signed)
Physician Discharge Summary  Patient ID: [name] MRN: [id number] DOB/AGE: 51/12/1960 51 y.o.  Admit date: 08/08/2011 Discharge date: 08/09/2011  Primary Care Physician:  Nadean Corwin, MD, MD   Discharge Diagnoses:    Principal Problem:  *Thrombocytopenia Active Problems:  Hyperlipidemia  Immune thrombocytopenic purpura    Current Discharge Medication List    START taking these medications   Details  predniSONE (DELTASONE) 50 MG tablet Take 2 tablets (100 mg total) by mouth daily. Qty: 20 tablet, Refills: 0   Associated Diagnoses: Thrombocytopenia      CONTINUE these medications which have NOT CHANGED   Details  ALPRAZolam (XANAX PO) Take 1 mg by mouth 3 (three) times daily as needed.     atorvastatin (LIPITOR) 80 MG tablet Take 80 mg by mouth daily.      omeprazole (PRILOSEC) 40 MG capsule Take 40 mg by mouth daily.        STOP taking these medications     aspirin EC 81 MG tablet      naproxen sodium (ANAPROX) 220 MG tablet        Discharge Exam: Blood pressure 135/79, pulse 88, temperature 98 F (36.7 C), temperature source Oral, resp. rate 20, height 5\' 10"  (1.778 m), weight 98.884 kg (218 lb), SpO2 96.00%. NAD CTA B S1, S2, RRR Soft, NT, BS+ No edema b/l  Disposition and Follow-up:  Patient will follow up with Dr. Gaylyn Rong next week, follow up with primary doctor as needed.  Consults:  Hematology, Dr. Ha/Dr. Donnie Coffin   Significant Diagnostic Studies:  Dg Chest 2 View  08/08/2011  *RADIOLOGY REPORT*  Clinical Data: 50 year old male with weakness.  CHEST - 2 VIEW  Comparison: 07/24/2010 and earlier.  Findings: Improved lung volumes.  Cardiac size and mediastinal contours are within normal limits.  Visualized tracheal air column is within normal limits.  No pneumothorax, pulmonary edema, pleural effusion or confluent pulmonary opacity.  Negative visualized bowel gas pattern. No acute osseous abnormality identified.  IMPRESSION: No acute  cardiopulmonary abnormality.  Original Report Authenticated By: Harley Hallmark, M.D.    Brief H and P: For complete details please refer to admission H and P, but in brief Patient is a very pleasant 50 year old white man with past medical history only significant for hyperlipidemia, who went to see his primary care physician yesterday for his six-month physical and was called today to come to the emergency department because his platelet count was 16,000. Upon further questioning he states that he has been more fatigued recently and that he has had several bruises without injury. He did note that about 2 months ago he started bleeding out of his ear after he used a Q-tip and also 3 weeks ago while he was out hunting [place] he cut his left forearm and bled for 3 days. Because of his platelet count we are asked to admit him for further evaluation.     Hospital Course:   Immune thrombocytopenic purpura Patient was admitted to the hospital due to findings of low platelets.  On admission his platelets were found to be 16K.  The remainder of his blood work was unremarkable.  He was initially seen by Dr. Gaylyn Rong, and appropriate work up was sent.  It was felt that his thrombocytopenia was likely due to ITP.  He was started on prednisone 100mg  daily.  The following day his platelets were found to be 21K.  He did not have any evidence of bleeding.  He was evaluated by Dr. Donnie Coffin and  felt that patient was safe to discharge home with close follow up.  He has been set up for a repeat CBC on 12/24, and he will be called by the cancer center for a follow up appointment.  He will be maintained on the current dose of prednisone until he is followed up.  Time spent on Discharge:  Signed: Dreonna Hussein [facility] 08/09/2011, 4:33 PM

## 2011-08-10 ENCOUNTER — Other Ambulatory Visit: Payer: Self-pay | Admitting: Oncology

## 2011-08-10 DIAGNOSIS — D693 Immune thrombocytopenic purpura: Secondary | ICD-10-CM

## 2011-08-11 ENCOUNTER — Other Ambulatory Visit: Payer: Self-pay | Admitting: Oncology

## 2011-08-11 ENCOUNTER — Telehealth: Payer: Self-pay | Admitting: *Deleted

## 2011-08-11 ENCOUNTER — Telehealth: Payer: Self-pay | Admitting: Oncology

## 2011-08-11 ENCOUNTER — Ambulatory Visit (HOSPITAL_BASED_OUTPATIENT_CLINIC_OR_DEPARTMENT_OTHER): Payer: BC Managed Care – PPO

## 2011-08-11 DIAGNOSIS — D693 Immune thrombocytopenic purpura: Secondary | ICD-10-CM

## 2011-08-11 DIAGNOSIS — D696 Thrombocytopenia, unspecified: Secondary | ICD-10-CM

## 2011-08-11 LAB — CBC WITH DIFFERENTIAL/PLATELET
BASO%: 0.3 % (ref 0.0–2.0)
Eosinophils Absolute: 0.1 10*3/uL (ref 0.0–0.5)
HCT: 39.1 % (ref 38.4–49.9)
LYMPH%: 19.2 % (ref 14.0–49.0)
MONO#: 0.6 10*3/uL (ref 0.1–0.9)
NEUT#: 9.4 10*3/uL — ABNORMAL HIGH (ref 1.5–6.5)
NEUT%: 75.5 % — ABNORMAL HIGH (ref 39.0–75.0)
Platelets: 71 10*3/uL — ABNORMAL LOW (ref 140–400)
RBC: 4.36 10*6/uL (ref 4.20–5.82)
WBC: 12.5 10*3/uL — ABNORMAL HIGH (ref 4.0–10.3)
lymph#: 2.4 10*3/uL (ref 0.9–3.3)

## 2011-08-11 LAB — PATHOLOGIST SMEAR REVIEW

## 2011-08-11 LAB — HEPATITIS PANEL, ACUTE: HCV Ab: NEGATIVE

## 2011-08-11 MED ORDER — PREDNISONE 10 MG PO TABS: ORAL_TABLET | ORAL | Status: DC

## 2011-08-11 NOTE — Telephone Encounter (Signed)
Attempted to reach pt on his home # and his emergency contact #,  No answer.  Left VM on each phone for pt to return call asap.   Dr. Gaylyn Rong instructs for pt to come in for CBC today.   Waiting for call back.

## 2011-08-11 NOTE — Telephone Encounter (Signed)
Pt called to get his dec,jan 2013 appts

## 2011-08-11 NOTE — Telephone Encounter (Signed)
Message copied by Wende Mott on Mon Aug 11, 2011 10:02 AM ------      Message from: HA, Raliegh Ip T      Created: Mon Aug 11, 2011  9:13 AM       Please inform him that his plt has improved.  Please  ask patient to decrease Prednisone from 100mg  to 80mg  PO daily.  Please have him call scheduler to schedules new dates for lab appointments (12/31; 1/7); and Lab/RV with me 1/10.  Thanks.

## 2011-08-11 NOTE — Telephone Encounter (Signed)
Pt returned call.  I informed him of Platelet results today up to 71 and instructed per Dr. Gaylyn Rong to decrease dose of Prednisone to 80 mg daily with labs again next Monday.  Instructed pt to call scheduling to schedule lab appts and visit w/ Dr. Gaylyn Rong.   Pt verbalized understanding and states only has 50 mg tablets of prednisone.  Informed will send refill to [facility] for 10 mg tabs per Dr. Gaylyn Rong.  He verbalized understanding.

## 2011-08-13 NOTE — Progress Notes (Signed)
UR completed 

## 2011-08-18 ENCOUNTER — Other Ambulatory Visit (HOSPITAL_BASED_OUTPATIENT_CLINIC_OR_DEPARTMENT_OTHER): Payer: BC Managed Care – PPO | Admitting: Lab

## 2011-08-18 ENCOUNTER — Telehealth: Payer: Self-pay | Admitting: *Deleted

## 2011-08-18 DIAGNOSIS — D693 Immune thrombocytopenic purpura: Secondary | ICD-10-CM

## 2011-08-18 LAB — CBC WITH DIFFERENTIAL/PLATELET
BASO%: 0.6 % (ref 0.0–2.0)
Basophils Absolute: 0.1 10*3/uL (ref 0.0–0.1)
HCT: 42.6 % (ref 38.4–49.9)
HGB: 14.6 g/dL (ref 13.0–17.1)
LYMPH%: 19.8 % (ref 14.0–49.0)
MCHC: 34.3 g/dL (ref 32.0–36.0)
MONO#: 0.4 10*3/uL (ref 0.1–0.9)
NEUT%: 74.3 % (ref 39.0–75.0)
Platelets: 72 10*3/uL — ABNORMAL LOW (ref 140–400)
WBC: 11.8 10*3/uL — ABNORMAL HIGH (ref 4.0–10.3)
lymph#: 2.3 10*3/uL (ref 0.9–3.3)

## 2011-08-18 LAB — BASIC METABOLIC PANEL
BUN: 13 mg/dL (ref 6–23)
CO2: 27 mEq/L (ref 19–32)
Calcium: 9.1 mg/dL (ref 8.4–10.5)
Chloride: 103 mEq/L (ref 96–112)
Creatinine, Ser: 0.88 mg/dL (ref 0.50–1.35)
Glucose, Bld: 107 mg/dL — ABNORMAL HIGH (ref 70–99)

## 2011-08-18 NOTE — Telephone Encounter (Signed)
Message copied by Wende Mott on Mon Aug 18, 2011  1:42 PM ------      Message from: HA, Raliegh Ip T      Created: Mon Aug 18, 2011  9:25 AM       Please call patient and advise him to continue Prednisone at current dose since there has not been significant improvement of his plt.  Thanks.  He has CBC again here next week.

## 2011-08-18 NOTE — Telephone Encounter (Signed)
Called pt's home number, which is same as his work number, no answer, did not leave VM. Called cell # next and did leave VM requesting he call back Dr. Lodema Pilot nurse for instructions.

## 2011-08-20 ENCOUNTER — Telehealth: Payer: Self-pay | Admitting: *Deleted

## 2011-08-20 NOTE — Telephone Encounter (Signed)
Pt returned call from this RN left him on Monday.  Instructed him to continue Prednisone 80mg  per Dr. Gaylyn Rong and keep next lab appt on 08/25/11 as scheduled.  He verbalized understanding.

## 2011-08-25 ENCOUNTER — Other Ambulatory Visit (HOSPITAL_BASED_OUTPATIENT_CLINIC_OR_DEPARTMENT_OTHER): Payer: BC Managed Care – PPO | Admitting: Lab

## 2011-08-25 ENCOUNTER — Telehealth: Payer: Self-pay | Admitting: *Deleted

## 2011-08-25 DIAGNOSIS — D696 Thrombocytopenia, unspecified: Secondary | ICD-10-CM

## 2011-08-25 DIAGNOSIS — D693 Immune thrombocytopenic purpura: Secondary | ICD-10-CM

## 2011-08-25 LAB — BASIC METABOLIC PANEL
BUN: 13 mg/dL (ref 6–23)
Chloride: 103 mEq/L (ref 96–112)
Potassium: 4 mEq/L (ref 3.5–5.3)
Sodium: 139 mEq/L (ref 135–145)

## 2011-08-25 LAB — CBC WITH DIFFERENTIAL/PLATELET
BASO%: 0.4 % (ref 0.0–2.0)
Basophils Absolute: 0.1 10*3/uL (ref 0.0–0.1)
EOS%: 0.9 % (ref 0.0–7.0)
MCH: 31.1 pg (ref 27.2–33.4)
MCHC: 34.2 g/dL (ref 32.0–36.0)
MCV: 90.7 fL (ref 79.3–98.0)
MONO%: 2.3 % (ref 0.0–14.0)
RBC: 4.61 10*6/uL (ref 4.20–5.82)
RDW: 13.2 % (ref 11.0–14.6)
lymph#: 1.7 10*3/uL (ref 0.9–3.3)

## 2011-08-25 MED ORDER — PREDNISONE 10 MG PO TABS: ORAL_TABLET | ORAL | Status: DC

## 2011-08-25 NOTE — Telephone Encounter (Signed)
Yes.  Prednisone 10mg  tab; take as directed (70mg  daily for now).  #100; no refill.  Thanks.

## 2011-08-25 NOTE — Telephone Encounter (Signed)
Called pt w/ results of Platelets and instructions to continue Prednisone 80 mg daily and to keep next appt for lab/Dr. Gaylyn Rong on 08/28/11.  Pt verbalized understanding and states needs refill on his prednisone. He took last pills this morning.

## 2011-08-25 NOTE — Telephone Encounter (Signed)
Message copied by Wende Mott on Mon Aug 25, 2011  4:39 PM ------      Message from: HA, Raliegh Ip T      Created: Mon Aug 25, 2011  1:36 PM       Please advise pt.  No change in Prednisone dose.  Thanks.

## 2011-08-28 ENCOUNTER — Ambulatory Visit (HOSPITAL_BASED_OUTPATIENT_CLINIC_OR_DEPARTMENT_OTHER): Payer: BC Managed Care – PPO | Admitting: Oncology

## 2011-08-28 ENCOUNTER — Other Ambulatory Visit: Payer: BC Managed Care – PPO | Admitting: Lab

## 2011-08-28 ENCOUNTER — Telehealth: Payer: Self-pay | Admitting: Oncology

## 2011-08-28 ENCOUNTER — Ambulatory Visit: Payer: BC Managed Care – PPO | Admitting: Oncology

## 2011-08-28 VITALS — BP 121/68 | HR 74 | Temp 97.3°F | Ht 70.0 in | Wt 237.0 lb

## 2011-08-28 DIAGNOSIS — D693 Immune thrombocytopenic purpura: Secondary | ICD-10-CM

## 2011-08-28 DIAGNOSIS — D696 Thrombocytopenia, unspecified: Secondary | ICD-10-CM

## 2011-08-28 LAB — COMPREHENSIVE METABOLIC PANEL
Albumin: 4.2 g/dL (ref 3.5–5.2)
BUN: 20 mg/dL (ref 6–23)
CO2: 25 mEq/L (ref 19–32)
Glucose, Bld: 158 mg/dL — ABNORMAL HIGH (ref 70–99)
Potassium: 4.4 mEq/L (ref 3.5–5.3)
Sodium: 135 mEq/L (ref 135–145)
Total Protein: 6.3 g/dL (ref 6.0–8.3)

## 2011-08-28 LAB — CBC WITH DIFFERENTIAL/PLATELET
Basophils Absolute: 0 10*3/uL (ref 0.0–0.1)
Eosinophils Absolute: 0 10*3/uL (ref 0.0–0.5)
HGB: 13.9 g/dL (ref 13.0–17.1)
LYMPH%: 7.5 % — ABNORMAL LOW (ref 14.0–49.0)
MONO#: 0.1 10*3/uL (ref 0.1–0.9)
NEUT#: 9.6 10*3/uL — ABNORMAL HIGH (ref 1.5–6.5)
Platelets: 58 10*3/uL — ABNORMAL LOW (ref 140–400)
RBC: 4.41 10*6/uL (ref 4.20–5.82)
WBC: 10.6 10*3/uL — ABNORMAL HIGH (ref 4.0–10.3)

## 2011-08-28 NOTE — Telephone Encounter (Signed)
weekley labs made and md visit on 2/21.  Per pt nurse will be calling IR for bm bx   aom

## 2011-08-28 NOTE — Progress Notes (Signed)
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   CURRENT THERAPY: Prednisone taper.   INTERVAL HISTORY: [name] 51 y.o. male returns for regular follow up. He has been on Prednisone taper now at 80mg  PO daily.  He no longer sees any petichaea.  He denies visible source of bleeding.  He has worsening insomnia and increased appetite and weight gain.  He has occasional diffuse chest wall pain both left and right anterior chest wall.  Pain is described as light achy which resolved with over the counter pain meds.  He also has mild crampy mid epigastric pain with heavy meals.   Patient denies fatigue, headache, visual changes, confusion, drenching night sweats, palpable lymph node swelling, mucositis, odynophagia, dysphagia, nausea vomiting, jaundice, chest pain, palpitation, shortness of breath, dyspnea on exertion, productive cough, gum bleeding, epistaxis, hematemesis, hemoptysis, abdominal swelling, early satiety, melena, hematochezia, hematuria, skin rash, spontaneous bleeding, joint swelling, joint pain, heat or cold intolerance, bowel bladder incontinence, back pain, focal motor weakness, paresthesia, depression, suicidal or homocidal ideation, feeling hopelessness.   MEDICAL HISTORY: Past Medical History  Diagnosis Date  . Acid reflux   . Hypercholesteremia   . Morbid obesity   . Bruise 08/08/11    pt states brusing x 1 month  . Tick bites 08/08/11    60 to 70 ticks in last hunting season , last > 1 month  . 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 Chest pain 08/08/2011    "a little in last month"  . Leg cramps 08/08/2011    bad in last 1.5 months  . Sleep apnea   . Arthritis     SURGICAL HISTORY:  Past Surgical History  Procedure Date  . Back surgery 07/2010    lumbar - 3 discs    MEDICATIONS: Current Outpatient Prescriptions  Medication  Sig Dispense Refill  . ALPRAZolam (XANAX PO) Take 1 mg by mouth 3 (three) times daily as needed.       Marland Kitchen omeprazole (PRILOSEC) 40 MG capsule Take 40 mg by mouth daily.        . predniSONE (DELTASONE) 10 MG tablet 80 mg daily or as directed by Dr. Gaylyn Rong.  100 tablet  0    ALLERGIES:  is allergic to penicillins.  REVIEW OF SYSTEMS:  The rest of the 14-point review of system was negative.   Filed Vitals:   08/28/11 1434  BP: 121/68  Pulse: 74  Temp: 97.3 F (36.3 C)   Wt Readings from Last 3 Encounters:  08/28/11 237 lb (107.502 kg)  08/08/11 218 lb (98.884 kg)   ECOG Performance status: 0  PHYSICAL EXAMINATION:  General:  well-nourished in no acute distress.  Eyes:  no scleral icterus.  ENT:  There were no oropharyngeal lesions.  Neck was without thyromegaly.  Lymphatics:  Negative cervical, supraclavicular or axillary adenopathy.  Respiratory: lungs were clear bilaterally without wheezing or crackles.  Cardiovascular:  Regular rate and rhythm, S1/S2, without murmur, rub or gallop.  There was no pedal edema.  GI:  abdomen was soft, flat, nontender, nondistended, without organomegaly.  Muscoloskeletal:  no spinal tenderness of palpation of vertebral spine.  Skin exam was without echymosis, petichae.  Neuro exam was nonfocal.  Patient was able to get on and off exam table without assistance.  Gait was normal.  Patient was alerted and oriented.  Attention was good.   Language was appropriate.  Mood was normal without depression.  Speech was not pressured.  Thought content was not tangential.     LABORATORY/RADIOLOGY DATA:  Lab Results  Component Value Date   WBC 10.6* 08/28/2011   HGB 13.9 08/28/2011   HCT 40.0 08/28/2011   PLT 58* 08/28/2011   GLUCOSE 109* 08/25/2011   CHOL 97 08/09/2011   TRIG 44 08/09/2011   HDL 33* 08/09/2011   LDLCALC 55 08/09/2011   ALT 20 08/08/2011   AST 17 08/08/2011   NA 139 08/25/2011   K 4.0 08/25/2011   CL 103 08/25/2011   CREATININE 1.00 08/25/2011   BUN 13  08/25/2011   CO2 29 08/25/2011   INR 1.06 08/08/2011    Dg Chest 2 View  08/08/2011  *RADIOLOGY REPORT*  Clinical Data: 51 year old male with weakness.  CHEST - 2 VIEW  Comparison: 07/24/2010 and earlier.  Findings: Improved lung volumes.  Cardiac size and mediastinal contours are within normal limits.  Visualized tracheal air column is within normal limits.  No pneumothorax, pulmonary edema, pleural effusion or confluent pulmonary opacity.  Negative visualized bowel gas pattern. No acute osseous abnormality identified.  IMPRESSION: No acute cardiopulmonary abnormality.  Original Report Authenticated By: Harley Hallmark, M.D.    ASSESSMENT AND PLAN:   Thrombocytopenia:  From presumed ITP.  He was ruled out for hepatitis, HIV, Vit B12/folate deficiency.  After an initial improvement of his plt, it has now slightly worsened despite still high dose Prednisone.  I discussed with [name] that I recommended a diagnostic bone marrow biopsy to rule out a primary bone marrow disease process such as MDS, lymphoprolifeative disease or infiltrative process.  He agreed, and I referred him to IR for CT-guided bone marrow biopsy.  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  I discussed with him 2nd and 3rd line treatments such as Rituxan and IVIG before trying thrombopoetin-mimetic agents such as Nplate or Promecta and before splenectomy.  Like Prednisone, these agents have response rate about 60%.    For now, I advised him to taper off Prednisone over 5 weeks course.  I provided him a instruction sheet (60mg  x 1 wk; 40mg  x 1wk; 20mg  x 1 wk, 10mg  x 1 wk; 5mg  x 1 wk then off).   He has BM biopsy pending.  He has lab CBC weekly.  If his thrombocytopenia significantly decreases to less than 30, his risk of bleeding will increase then which warrants 2nd line treatment such as Rituxan.  I advised him to contact us if he has petichea or spontaneous  bleeding.  I advised him to refrain from high risk activities which can result in bleeding.   I will see him in mid Feb 2013.   The length of time of the face-to-face encounter was 30 minutes. More than 50% of time was spent counseling and coordination of care.

## 2011-09-03 ENCOUNTER — Encounter (HOSPITAL_COMMUNITY): Payer: Self-pay | Admitting: Pharmacy Technician

## 2011-09-03 ENCOUNTER — Ambulatory Visit: Payer: BC Managed Care – PPO | Admitting: Oncology

## 2011-09-03 ENCOUNTER — Other Ambulatory Visit: Payer: BC Managed Care – PPO | Admitting: Lab

## 2011-09-04 ENCOUNTER — Telehealth: Payer: Self-pay | Admitting: *Deleted

## 2011-09-04 ENCOUNTER — Other Ambulatory Visit (HOSPITAL_BASED_OUTPATIENT_CLINIC_OR_DEPARTMENT_OTHER): Payer: BC Managed Care – PPO | Admitting: Lab

## 2011-09-04 ENCOUNTER — Other Ambulatory Visit: Payer: Self-pay | Admitting: Radiology

## 2011-09-04 DIAGNOSIS — D696 Thrombocytopenia, unspecified: Secondary | ICD-10-CM

## 2011-09-04 LAB — CBC WITH DIFFERENTIAL/PLATELET
Basophils Absolute: 0.1 10*3/uL (ref 0.0–0.1)
Eosinophils Absolute: 0.2 10*3/uL (ref 0.0–0.5)
HGB: 14.7 g/dL (ref 13.0–17.1)
MONO#: 0.3 10*3/uL (ref 0.1–0.9)
NEUT#: 9 10*3/uL — ABNORMAL HIGH (ref 1.5–6.5)
RBC: 4.69 10*6/uL (ref 4.20–5.82)
RDW: 13.6 % (ref 11.0–14.6)
WBC: 11.3 10*3/uL — ABNORMAL HIGH (ref 4.0–10.3)

## 2011-09-04 NOTE — Telephone Encounter (Signed)
Called pt w/ results of Platelets and relayed message from Dr. Gaylyn Rong below.  Pt states he started the 40 mg daily yesterday per taper instructions.  He verbalized understanding to continue this and to keep lab appt next week as scheduled.

## 2011-09-04 NOTE — Telephone Encounter (Signed)
Message copied by Wende Mott on Thu Sep 04, 2011  3:42 PM ------      Message from: HA, Raliegh Ip T      Created: Thu Sep 04, 2011  8:49 AM       Please call patient and advise him to continue with Prednisone taper as previously instructed.  He should be on 60mg  PO daily now; and then 40mg  PO daily next week. Thanks.

## 2011-09-09 ENCOUNTER — Encounter (HOSPITAL_COMMUNITY): Payer: Self-pay

## 2011-09-09 ENCOUNTER — Ambulatory Visit (HOSPITAL_COMMUNITY)
Admission: RE | Admit: 2011-09-09 | Discharge: 2011-09-09 | Disposition: A | Discharge status: Home / Self Care | Payer: BC Managed Care – PPO | Source: Ambulatory Visit | Attending: Oncology | Admitting: Oncology

## 2011-09-09 DIAGNOSIS — Z79899 Other long term (current) drug therapy: Secondary | ICD-10-CM | POA: Insufficient documentation

## 2011-09-09 DIAGNOSIS — E7800 Pure hypercholesterolemia, unspecified: Secondary | ICD-10-CM | POA: Insufficient documentation

## 2011-09-09 DIAGNOSIS — D696 Thrombocytopenia, unspecified: Secondary | ICD-10-CM

## 2011-09-09 DIAGNOSIS — K219 Gastro-esophageal reflux disease without esophagitis: Secondary | ICD-10-CM | POA: Insufficient documentation

## 2011-09-09 DIAGNOSIS — G4730 Sleep apnea, unspecified: Secondary | ICD-10-CM | POA: Insufficient documentation

## 2011-09-09 DIAGNOSIS — IMO0002 Reserved for concepts with insufficient information to code with codable children: Secondary | ICD-10-CM | POA: Insufficient documentation

## 2011-09-09 LAB — CBC
MCH: 30.2 pg (ref 26.0–34.0)
MCV: 89.8 fL (ref 78.0–100.0)
Platelets: 60 10*3/uL — ABNORMAL LOW (ref 150–400)
RDW: 13.3 % (ref 11.5–15.5)
WBC: 11.5 10*3/uL — ABNORMAL HIGH (ref 4.0–10.5)

## 2011-09-09 MED ORDER — SODIUM CHLORIDE 0.9 % IV SOLN: INTRAVENOUS | Status: DC

## 2011-09-09 MED ORDER — FENTANYL CITRATE 0.05 MG/ML IJ SOLN
INTRAMUSCULAR | Status: AC | PRN
  Administered 2011-09-09 (×2): 50 ug via INTRAVENOUS

## 2011-09-09 MED ORDER — MIDAZOLAM HCL 5 MG/5ML IJ SOLN
INTRAMUSCULAR | Status: AC | PRN
  Administered 2011-09-09 (×2): 2 mg via INTRAVENOUS

## 2011-09-09 NOTE — Procedures (Signed)
Technically successful CT guided bone marrow aspiration and biopsy of left iliac crest. No immediate complications. Awaiting pathology report.    

## 2011-09-09 NOTE — ED Notes (Signed)
Family updated as to patient's status.

## 2011-09-09 NOTE — H&P (Signed)
[name] is an 51 y.o. male.   Chief Complaint: Here for CT guided BM aspiration and Bx HPI: History of thrombocytopenia and ITP, here for CT guided BX aspiration and Bx  Past Medical History  Diagnosis Date  . Acid reflux   . Hypercholesteremia   . Morbid obesity   . Bruise 08/08/11    pt states brusing x 1 month  . Tick bites 08/08/11    60 to 70 ticks in last hunting season , last > 1 month  . 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 Chest pain 08/08/2011    "a little in last month"  . Leg cramps 08/08/2011    bad in last 1.5 months  . Sleep apnea   . Arthritis     Past Surgical History  Procedure Date  . Back surgery 07/2010    lumbar - 3 discs    Family History  Problem Relation Age of Onset  . Uterine cancer Mother   . Uterine cancer Sister    Social History:  reports that he has been smoking.  His smokeless tobacco use includes Chew. He reports that he drinks about 1.2 ounces of alcohol per week. He reports that he does not use illicit drugs.  Allergies:  Allergies  Allergen Reactions  . Niacin And Related Swelling    Red rash and swelling  . Penicillins Other (See Comments)    Unknown childhood allergy    Medications Prior to Admission  Medication Sig Dispense Refill  . ALPRAZolam (XANAX PO) Take 1 mg by mouth 3 (three) times daily as needed. Anxiety       . omeprazole (PRILOSEC) 40 MG capsule Take 40 mg by mouth daily.        . predniSONE (DELTASONE) 10 MG tablet Take 20-40 mg by mouth daily. 40 mg this week then 20 mg the next week, 10 mg the next week, then 5 mg the next week.       Medications Prior to Admission  Medication Dose Route Frequency Provider Last Rate Last Dose  . 0.9 %  sodium chloride infusion   Intravenous Continuous Robet Leu, PA        Results for orders placed during the hospital encounter of 09/09/11 (from the past 48 hour(s))  APTT     Status: Normal   Collection Time   09/09/11  7:50 AM      Component Value Range Comment   aPTT 29  24 - 37 (seconds)   CBC     Status: Abnormal   Collection Time   09/09/11  7:50 AM      Component Value Range Comment   WBC 11.5 (*) 4.0 - 10.5 (K/uL)    RBC 4.90  4.22 - 5.81 (MIL/uL)    Hemoglobin 14.8  13.0 - 17.0 (g/dL)    HCT 16.1  09.6 - 04.5 (%)    MCV 89.8  78.0 - 100.0 (fL)    MCH 30.2  26.0 - 34.0 (pg)    MCHC 33.6  30.0 - 36.0 (g/dL)    RDW 40.9  81.1 - 91.4 (%)    Platelets 60 (*) 150 - 400 (K/uL)   PROTIME-INR     Status: Normal   Collection Time   09/09/11  7:50 AM      Component Value Range Comment   Prothrombin Time 12.8  11.6 - 15.2 (seconds)  INR 0.94  0.00 - 1.49     No results found.  ROS: No complaints  Blood pressure 129/79, pulse 89, temperature 98 F (36.7 C), temperature source Oral, resp. rate 18, height 5\' 10"  (1.778 m), weight 237 lb (107.502 kg), SpO2 99.00%. Physical Exam   Assessment/Plan 51 y/o M with history of thrombocytopenia here for CT guided BM aspiration and Bx.  Labs reviewed. Risks and benefits discussed at length with patient and patient's wife who are in agreement with proceeding with procedure.   Simonne Come 09/09/2011, 8:57 AM

## 2011-09-09 NOTE — ED Notes (Signed)
Patient denies pain and is resting comfortably.  

## 2011-09-09 NOTE — ED Notes (Signed)
Incision made

## 2011-09-11 ENCOUNTER — Other Ambulatory Visit (HOSPITAL_BASED_OUTPATIENT_CLINIC_OR_DEPARTMENT_OTHER): Payer: BC Managed Care – PPO | Admitting: Lab

## 2011-09-11 DIAGNOSIS — D696 Thrombocytopenia, unspecified: Secondary | ICD-10-CM

## 2011-09-11 LAB — CBC WITH DIFFERENTIAL/PLATELET
BASO%: 0.4 % (ref 0.0–2.0)
LYMPH%: 18.5 % (ref 14.0–49.0)
MCH: 30.6 pg (ref 27.2–33.4)
MCHC: 33.6 g/dL (ref 32.0–36.0)
MCV: 91.1 fL (ref 79.3–98.0)
MONO%: 4.7 % (ref 0.0–14.0)
Platelets: 67 10*3/uL — ABNORMAL LOW (ref 140–400)
RBC: 4.98 10*6/uL (ref 4.20–5.82)

## 2011-09-12 ENCOUNTER — Telehealth: Payer: Self-pay | Admitting: *Deleted

## 2011-09-12 NOTE — Telephone Encounter (Signed)
VM from pt asking about the results of his Platelets from yesterday?

## 2011-09-12 NOTE — Telephone Encounter (Signed)
Called pt back w/ results of Platelets and informed him per Dr. Gaylyn Rong to continue prednisone taper.  Pt states he went down to 20 mg daily on Wed this week.  He verbalized understanding and will keep lab appt as scheduled next Thursday.

## 2011-09-12 NOTE — Telephone Encounter (Signed)
Please tell him that it's stable.  Not better or worse.  Continue with Prednisone taper as previously instructed.  Thanks.

## 2011-09-18 ENCOUNTER — Other Ambulatory Visit (HOSPITAL_BASED_OUTPATIENT_CLINIC_OR_DEPARTMENT_OTHER): Payer: BC Managed Care – PPO | Admitting: Lab

## 2011-09-18 ENCOUNTER — Other Ambulatory Visit: Payer: Self-pay | Admitting: Oncology

## 2011-09-18 DIAGNOSIS — D696 Thrombocytopenia, unspecified: Secondary | ICD-10-CM

## 2011-09-18 LAB — CBC WITH DIFFERENTIAL/PLATELET
BASO%: 0.2 % (ref 0.0–2.0)
EOS%: 1.7 % (ref 0.0–7.0)
LYMPH%: 18.8 % (ref 14.0–49.0)
MCHC: 34.2 g/dL (ref 32.0–36.0)
MONO#: 0.6 10*3/uL (ref 0.1–0.9)
Platelets: 57 10*3/uL — ABNORMAL LOW (ref 140–400)
RBC: 4.86 10*6/uL (ref 4.20–5.82)
WBC: 11.3 10*3/uL — ABNORMAL HIGH (ref 4.0–10.3)
lymph#: 2.1 10*3/uL (ref 0.9–3.3)

## 2011-09-18 NOTE — Progress Notes (Signed)
Called patient to report that plts are decreased from ITP; patient told to continue prednisone taper as instructed; may consider alternative treatment of plts fall below 30; patient verbalizes understanding.

## 2011-09-25 ENCOUNTER — Telehealth: Payer: Self-pay | Admitting: *Deleted

## 2011-09-25 ENCOUNTER — Other Ambulatory Visit (HOSPITAL_BASED_OUTPATIENT_CLINIC_OR_DEPARTMENT_OTHER): Payer: BC Managed Care – PPO | Admitting: Lab

## 2011-09-25 DIAGNOSIS — D696 Thrombocytopenia, unspecified: Secondary | ICD-10-CM

## 2011-09-25 LAB — CBC WITH DIFFERENTIAL/PLATELET
BASO%: 0.5 % (ref 0.0–2.0)
HCT: 44.5 % (ref 38.4–49.9)
MCHC: 34.3 g/dL (ref 32.0–36.0)
MONO#: 0.4 10*3/uL (ref 0.1–0.9)
NEUT%: 69.7 % (ref 39.0–75.0)
RDW: 12.9 % (ref 11.0–14.6)
WBC: 9.8 10*3/uL (ref 4.0–10.3)
lymph#: 2.3 10*3/uL (ref 0.9–3.3)

## 2011-09-25 NOTE — Telephone Encounter (Signed)
Called pt and relayed Dr. Lodema Pilot message, gave results of Platelets and instructed to continue Prednisone taper.  Pt states decreased to 5 mg yesterday x 1 more week, then he will be out of prednisone.  Instructed pt to keep appt for labs next Thurs as scheduled and we will reorder prednisone at that time if needed.  Pt verbalized understanding.

## 2011-09-25 NOTE — Telephone Encounter (Signed)
Message copied by Wende Mott on Thu Sep 25, 2011 11:51 AM ------      Message from: HA, Raliegh Ip T      Created: Thu Sep 25, 2011  9:45 AM       Please call pt.  His Plt is stable.  Continue to taper down Prednisone as previously instructed.  Thanks.

## 2011-10-02 ENCOUNTER — Other Ambulatory Visit (HOSPITAL_BASED_OUTPATIENT_CLINIC_OR_DEPARTMENT_OTHER): Payer: BC Managed Care – PPO

## 2011-10-02 ENCOUNTER — Telehealth: Payer: Self-pay | Admitting: *Deleted

## 2011-10-02 DIAGNOSIS — D696 Thrombocytopenia, unspecified: Secondary | ICD-10-CM

## 2011-10-02 LAB — CBC WITH DIFFERENTIAL/PLATELET
Basophils Absolute: 0 10*3/uL (ref 0.0–0.1)
Eosinophils Absolute: 0.2 10*3/uL (ref 0.0–0.5)
HCT: 43.8 % (ref 38.4–49.9)
HGB: 15.2 g/dL (ref 13.0–17.1)
MCH: 30.8 pg (ref 27.2–33.4)
MONO#: 0.6 10*3/uL (ref 0.1–0.9)
NEUT#: 7.2 10*3/uL — ABNORMAL HIGH (ref 1.5–6.5)
NEUT%: 67.2 % (ref 39.0–75.0)
RDW: 12.9 % (ref 11.0–14.6)
WBC: 10.7 10*3/uL — ABNORMAL HIGH (ref 4.0–10.3)
lymph#: 2.7 10*3/uL (ref 0.9–3.3)

## 2011-10-02 NOTE — Telephone Encounter (Signed)
Message copied by Wende Mott on Thu Oct 02, 2011 10:21 AM ------      Message from: HA, Raliegh Ip T      Created: Thu Oct 02, 2011  9:14 AM       Please call pt.  His plt is stable.  Continue to taper off of Prednisone as previously instructed.  Thanks.

## 2011-10-02 NOTE — Telephone Encounter (Signed)
Called pt w/ results of Platelet count and instructed to continue taper. Pt actually took his last prednisone 5 mg yesterday and if off prednisone now.  Dr. Gaylyn Rong aware.  Instructed pt to keep appt for lab and to see Dr. Gaylyn Rong next week as scheduled.  He verbalized understanding.

## 2011-10-09 ENCOUNTER — Telehealth: Payer: Self-pay | Admitting: Oncology

## 2011-10-09 ENCOUNTER — Other Ambulatory Visit: Payer: BC Managed Care – PPO

## 2011-10-09 ENCOUNTER — Ambulatory Visit (HOSPITAL_BASED_OUTPATIENT_CLINIC_OR_DEPARTMENT_OTHER): Payer: BC Managed Care – PPO | Admitting: Oncology

## 2011-10-09 DIAGNOSIS — D693 Immune thrombocytopenic purpura: Secondary | ICD-10-CM

## 2011-10-09 DIAGNOSIS — D696 Thrombocytopenia, unspecified: Secondary | ICD-10-CM

## 2011-10-09 LAB — CBC WITH DIFFERENTIAL/PLATELET
Basophils Absolute: 0 10*3/uL (ref 0.0–0.1)
EOS%: 2.3 % (ref 0.0–7.0)
Eosinophils Absolute: 0.3 10*3/uL (ref 0.0–0.5)
HCT: 48.7 % (ref 38.4–49.9)
HGB: 16.9 g/dL (ref 13.0–17.1)
MCH: 30.4 pg (ref 27.2–33.4)
MCV: 87.8 fL (ref 79.3–98.0)
MONO%: 7.5 % (ref 0.0–14.0)
NEUT#: 8 10*3/uL — ABNORMAL HIGH (ref 1.5–6.5)
NEUT%: 68.3 % (ref 39.0–75.0)
Platelets: 74 10*3/uL — ABNORMAL LOW (ref 140–400)

## 2011-10-09 NOTE — Progress Notes (Signed)
[facility] OFFICE PROGRESS NOTE  Cc:  Nadean Corwin, MD, MD  DIAGNOSIS:  Presumed ITP.   CURRENT THERAPY: No brisk response with Prednisone.  Therefore, Prednisone was tapered off.  He finished the last Prenisone done on 10/01/11.  He is now on observation.   INTERVAL HISTORY: [name] 51 y.o. male returns for regular follow up. He reported feeling relatively well.  For the last few days, he has been having some pain the right hip radiating to the right lateral thigh when he moves his hips in certain position.  It it not there at rest.  He also has mild right rib pain radiating anteriorly from the back.  He also has right shoulder pain.  Of note, he had history of spine trauma and needed surgery.  These pains do not bother him that much; and he takes OTC pain med with relief.  With respect to his history of ITP, he does not notice any active bleeding.   Patient denies fatigue, headache, visual changes, confusion, drenching night sweats, palpable lymph node swelling, mucositis, odynophagia, dysphagia, nausea vomiting, jaundice, chest pain, palpitation, shortness of breath, dyspnea on exertion, productive cough, gum bleeding, epistaxis, hematemesis, hemoptysis, abdominal swelling, early satiety, melena, hematochezia, hematuria, skin rash, spontaneous bleeding, joint swelling, joint pain, heat or cold intolerance, bowel bladder incontinence, focal motor weakness, paresthesia, depression, suicidal or homocidal ideation, feeling hopelessness.   MEDICAL HISTORY: Past Medical History  Diagnosis Date  . Acid reflux   . Hypercholesteremia   . Morbid obesity   . Bruise 08/08/11    pt states brusing x 1 month  . Tick bites 08/08/11    60 to 70 ticks in last hunting season , last > 1 month  . 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 Chest pain 08/08/2011    "a little in last month"  . Leg cramps 08/08/2011   bad in last 1.5 months  . Sleep apnea   . Arthritis     SURGICAL HISTORY:  Past Surgical History  Procedure Date  . Back surgery 07/2010    lumbar - 3 discs    MEDICATIONS: Current Outpatient Prescriptions  Medication Sig Dispense Refill  . ALPRAZolam (XANAX PO) Take 1 mg by mouth 3 (three) times daily as needed. Anxiety       . omeprazole (PRILOSEC) 40 MG capsule Take 40 mg by mouth daily.          ALLERGIES:  is allergic to niacin and related and penicillins.  REVIEW OF SYSTEMS:  The rest of the 14-point review of system was negative.   Filed Vitals:   10/09/11 0819  BP: 121/82  Pulse: 80  Temp: 97.1 F (36.2 C)   Wt Readings from Last 3 Encounters:  10/09/11 226 lb 12.8 oz (102.876 kg)  09/09/11 237 lb (107.502 kg)  08/28/11 237 lb (107.502 kg)   ECOG Performance status: 0  PHYSICAL EXAMINATION:  General:  well-nourished in no acute distress.  Eyes:  no scleral icterus.  ENT:  There were no oropharyngeal lesions.  Neck was without thyromegaly.  Lymphatics:  Negative cervical, supraclavicular or axillary adenopathy.  Respiratory: lungs were clear bilaterally without wheezing or crackles.  Cardiovascular:  Regular rate and rhythm, S1/S2, without murmur, rub or gallop.  There was no pedal edema.  GI:  abdomen was soft, flat, nontender, nondistended, without organomegaly.  Muscoloskeletal:  no spinal tenderness of  palpation of vertebral spine.  I could not illicit any pain in palpation of right shoulder, back, or hip.  Skin exam was without echymosis, petichae.  Neuro exam was nonfocal.  Patient was able to get on and off exam table without assistance.  Gait was normal.  Patient was alerted and oriented.  Attention was good.   Language was appropriate.  Mood was normal without depression.  Speech was not pressured.  Thought content was not tangential.     LABORATORY/RADIOLOGY DATA:  Lab Results  Component Value Date   WBC 11.7* 10/09/2011   HGB 16.9 10/09/2011   HCT 48.7  10/09/2011   PLT 74* 10/09/2011   GLUCOSE 158* 08/28/2011   CHOL 97 08/09/2011   TRIG 44 08/09/2011   HDL 33* 08/09/2011   LDLCALC 55 08/09/2011   ALT 20 08/28/2011   AST 13 08/28/2011   NA 135 08/28/2011   K 4.4 08/28/2011   CL 102 08/28/2011   CREATININE 1.02 08/28/2011   BUN 20 08/28/2011   CO2 25 08/28/2011   INR 0.94 09/09/2011      ASSESSMENT AND PLAN:   1.  ITP:  Negative work up for thrombocytopenia in the past including bone marrow biopsy.  He did not have brisk response with high dose steroid.  Therefore, I had tapered off his Prednisone.  His plt is still stable.  I discussed with him that there is low risk of spontaneous hemorrhage with platelet count >30K.  Therefore, we will continue to observe for now with CBC q2 wks.  In the future, if his thrombocytopenia worsens to less than 30K, we may consider starting Rituxan.  I explained to [name] the rationale of using Rituxan in patients with refractory ITP since it's relatively safe side effect profile and chance of response in 60% range.  [name] expressed informed understanding and wished to proceed with just observation at this time. I advised patient to watch out for bleeding symptoms including petechiae.   2.  Right hip pain, shoulder pain:  Consistent with OA.  He does not have red flags such as weight loss, history of cancer; or focal motor weakness to warrant further work up.  I advised him on OTC pain meds.   3.  Follow up:  q2wk CBC.  I'll see him in about  3 months.

## 2011-10-09 NOTE — Telephone Encounter (Signed)
q2weekley labs made as well as md visit   aom

## 2011-10-09 NOTE — Progress Notes (Signed)
[facility] END OF TREATMENT   Name: [name] Date: 10/09/2011 MRN: [id number] DOB: 07-31-1961   TREATMENT DATES:  08/08/2011 to 10/01/2011   REFERRING PHYSICIAN: Nadean Corwin,*   DIAGNOSIS: The encounter diagnosis was ITP (idiopathic thrombocytopenic purpura).   STAGE AT START OF TREATMENT: N.A.   INTENT: N.A.   DRUGS OR REGIMENS GIVEN:  Prednisone taper   MAJOR TOXICITIES:  none   REASON TREATMENT STOPPED:  Not effective.    PERFORMANCE STATUS AT END:  ECOG 0   ONGOING PROBLEMS:  Mild thrombocytopenia.    FOLLOW UP PLANS:  q2wk CBC.  Will try Rituxan if worsened thrombocytopenia to less than 30K.

## 2011-10-23 ENCOUNTER — Telehealth: Payer: Self-pay | Admitting: *Deleted

## 2011-10-23 ENCOUNTER — Other Ambulatory Visit (HOSPITAL_BASED_OUTPATIENT_CLINIC_OR_DEPARTMENT_OTHER): Payer: BC Managed Care – PPO | Admitting: Lab

## 2011-10-23 DIAGNOSIS — D693 Immune thrombocytopenic purpura: Secondary | ICD-10-CM

## 2011-10-23 LAB — CBC WITH DIFFERENTIAL/PLATELET
Basophils Absolute: 0.1 10*3/uL (ref 0.0–0.1)
EOS%: 1.8 % (ref 0.0–7.0)
Eosinophils Absolute: 0.2 10*3/uL (ref 0.0–0.5)
LYMPH%: 26.7 % (ref 14.0–49.0)
MCH: 30 pg (ref 27.2–33.4)
MCV: 88.1 fL (ref 79.3–98.0)
MONO%: 7.7 % (ref 0.0–14.0)
NEUT#: 6.4 10*3/uL (ref 1.5–6.5)
Platelets: 89 10*3/uL — ABNORMAL LOW (ref 140–400)
RBC: 5.46 10*6/uL (ref 4.20–5.82)
RDW: 12.6 % (ref 11.0–14.6)

## 2011-10-23 NOTE — Telephone Encounter (Signed)
Message copied by Wende Mott on Thu Oct 23, 2011 10:32 AM ------      Message from: HA, Raliegh Ip T      Created: Thu Oct 23, 2011  9:25 AM       Please call pt.  His Plt has slightly increased.  Will continue to observe his ITP off of Prednisone.  Thanks.

## 2011-10-23 NOTE — Telephone Encounter (Signed)
Yes, Lyme can cause thrombocytopenia but less than 2% of cases.  He should talk to his PCP to see whether testing for Lyme is indicated since Lyme result is rather nonspecific and can be false positive.   Thanks.

## 2011-10-23 NOTE — Telephone Encounter (Signed)
Called pt back and relayed Dr. Lodema Pilot message.  He verbalized understanding, states he may check w/ his PCP about possibility of getting checked for Lyme disease.

## 2011-10-23 NOTE — Telephone Encounter (Signed)
Called pt w/ results of Platelets and instructed to keep next lab appt as scheduled. He verbalized understanding.  Pt asks if Dr. Gaylyn Rong has checked him for Lyme Disease? He thinks he has symptoms such as fatigue, body aches, and read somewhere it can also cause low platelets.  Along w/ history of tick bites.

## 2011-11-06 ENCOUNTER — Telehealth: Payer: Self-pay

## 2011-11-06 ENCOUNTER — Other Ambulatory Visit (HOSPITAL_BASED_OUTPATIENT_CLINIC_OR_DEPARTMENT_OTHER): Payer: BC Managed Care – PPO | Admitting: Lab

## 2011-11-06 DIAGNOSIS — D693 Immune thrombocytopenic purpura: Secondary | ICD-10-CM

## 2011-11-06 LAB — CBC WITH DIFFERENTIAL/PLATELET
BASO%: 0.7 % (ref 0.0–2.0)
EOS%: 3.5 % (ref 0.0–7.0)
LYMPH%: 29 % (ref 14.0–49.0)
MCHC: 34.3 g/dL (ref 32.0–36.0)
MCV: 87.2 fL (ref 79.3–98.0)
MONO%: 8.1 % (ref 0.0–14.0)
NEUT#: 5.2 10*3/uL (ref 1.5–6.5)
Platelets: 88 10*3/uL — ABNORMAL LOW (ref 140–400)
RBC: 5.34 10*6/uL (ref 4.20–5.82)
RDW: 12.6 % (ref 11.0–14.6)

## 2011-11-06 NOTE — Telephone Encounter (Signed)
Message copied by Kallie Locks on Thu Nov 06, 2011  3:09 PM ------      Message from: HA, Raliegh Ip T      Created: Thu Nov 06, 2011 11:24 AM       Please call pt.  His ITP is stable.  Plt is unchanged from before.  Continue observation.  Thanks.

## 2011-11-20 ENCOUNTER — Telehealth: Payer: Self-pay

## 2011-11-20 ENCOUNTER — Other Ambulatory Visit (HOSPITAL_BASED_OUTPATIENT_CLINIC_OR_DEPARTMENT_OTHER): Payer: BC Managed Care – PPO | Admitting: Lab

## 2011-11-20 DIAGNOSIS — D693 Immune thrombocytopenic purpura: Secondary | ICD-10-CM

## 2011-11-20 LAB — CBC WITH DIFFERENTIAL/PLATELET
BASO%: 0.9 % (ref 0.0–2.0)
Eosinophils Absolute: 0.3 10*3/uL (ref 0.0–0.5)
LYMPH%: 28.6 % (ref 14.0–49.0)
MCHC: 33.9 g/dL (ref 32.0–36.0)
MONO#: 0.8 10*3/uL (ref 0.1–0.9)
NEUT#: 4.9 10*3/uL (ref 1.5–6.5)
RBC: 5.06 10*6/uL (ref 4.20–5.82)
RDW: 13 % (ref 11.0–14.6)
WBC: 8.6 10*3/uL (ref 4.0–10.3)
lymph#: 2.5 10*3/uL (ref 0.9–3.3)
nRBC: 0 % (ref 0–0)

## 2011-11-20 NOTE — Telephone Encounter (Signed)
SPOKE WITH MR. Mckeehan AND TOLD HIM THAT HIS PLTS. WERE 89 K AND DR. HA SAID THAT HIS ITP IS STABLE.  KEEP NEXT LAB APPT. AS SCHEDULED.  PT. VERBALIZED UNDERSTANDING.

## 2011-12-04 ENCOUNTER — Other Ambulatory Visit (HOSPITAL_BASED_OUTPATIENT_CLINIC_OR_DEPARTMENT_OTHER): Payer: BC Managed Care – PPO | Admitting: Lab

## 2011-12-04 DIAGNOSIS — D696 Thrombocytopenia, unspecified: Secondary | ICD-10-CM

## 2011-12-04 DIAGNOSIS — D693 Immune thrombocytopenic purpura: Secondary | ICD-10-CM

## 2011-12-04 LAB — CBC WITH DIFFERENTIAL/PLATELET
Basophils Absolute: 0.1 10*3/uL (ref 0.0–0.1)
Eosinophils Absolute: 0.3 10*3/uL (ref 0.0–0.5)
HGB: 14.9 g/dL (ref 13.0–17.1)
MONO#: 0.8 10*3/uL (ref 0.1–0.9)
NEUT#: 5.9 10*3/uL (ref 1.5–6.5)
Platelets: 93 10*3/uL — ABNORMAL LOW (ref 140–400)
RBC: 5.05 10*6/uL (ref 4.20–5.82)
RDW: 13 % (ref 11.0–14.6)
WBC: 9.4 10*3/uL (ref 4.0–10.3)
nRBC: 0 % (ref 0–0)

## 2011-12-05 ENCOUNTER — Telehealth: Payer: Self-pay

## 2011-12-05 NOTE — Telephone Encounter (Signed)
Message copied by Kallie Locks on Fri Dec 05, 2011  4:33 PM ------      Message from: HA, Raliegh Ip T      Created: Thu Dec 04, 2011  9:22 AM       Please call pt.  His plt count has slightly improved.  Continue observation.  Keep previously arranged appointments.  Thanks.

## 2011-12-18 ENCOUNTER — Telehealth: Payer: Self-pay | Admitting: *Deleted

## 2011-12-18 ENCOUNTER — Other Ambulatory Visit (HOSPITAL_BASED_OUTPATIENT_CLINIC_OR_DEPARTMENT_OTHER): Payer: BC Managed Care – PPO | Admitting: Lab

## 2011-12-18 DIAGNOSIS — D693 Immune thrombocytopenic purpura: Secondary | ICD-10-CM

## 2011-12-18 LAB — CBC WITH DIFFERENTIAL/PLATELET
Basophils Absolute: 0.1 10*3/uL (ref 0.0–0.1)
Eosinophils Absolute: 0.3 10*3/uL (ref 0.0–0.5)
HCT: 44.3 % (ref 38.4–49.9)
HGB: 15 g/dL (ref 13.0–17.1)
MONO#: 0.7 10*3/uL (ref 0.1–0.9)
NEUT%: 63.3 % (ref 39.0–75.0)
WBC: 9.3 10*3/uL (ref 4.0–10.3)
lymph#: 2.3 10*3/uL (ref 0.9–3.3)

## 2011-12-18 NOTE — Telephone Encounter (Signed)
Informed pt of stable platelet count and to keep his appt next week as scheduled.  He verbalized understanding.

## 2011-12-18 NOTE — Telephone Encounter (Signed)
Message copied by Wende Mott on Thu Dec 18, 2011  9:59 AM ------      Message from: HA, Raliegh Ip T      Created: Thu Dec 18, 2011  9:18 AM       Please call pt.  His plt is stable.  Again, watchful observation.  Thanks.

## 2011-12-25 ENCOUNTER — Other Ambulatory Visit (HOSPITAL_BASED_OUTPATIENT_CLINIC_OR_DEPARTMENT_OTHER): Payer: BC Managed Care – PPO | Admitting: Lab

## 2011-12-25 ENCOUNTER — Ambulatory Visit (HOSPITAL_BASED_OUTPATIENT_CLINIC_OR_DEPARTMENT_OTHER): Payer: BC Managed Care – PPO | Admitting: Oncology

## 2011-12-25 ENCOUNTER — Telehealth: Payer: Self-pay | Admitting: Oncology

## 2011-12-25 VITALS — BP 134/79 | HR 102 | Temp 96.6°F | Ht 70.0 in | Wt 229.3 lb

## 2011-12-25 DIAGNOSIS — D72829 Elevated white blood cell count, unspecified: Secondary | ICD-10-CM

## 2011-12-25 DIAGNOSIS — J329 Chronic sinusitis, unspecified: Secondary | ICD-10-CM

## 2011-12-25 DIAGNOSIS — D693 Immune thrombocytopenic purpura: Secondary | ICD-10-CM

## 2011-12-25 DIAGNOSIS — R52 Pain, unspecified: Secondary | ICD-10-CM

## 2011-12-25 DIAGNOSIS — J189 Pneumonia, unspecified organism: Secondary | ICD-10-CM

## 2011-12-25 DIAGNOSIS — R059 Cough, unspecified: Secondary | ICD-10-CM

## 2011-12-25 DIAGNOSIS — R05 Cough: Secondary | ICD-10-CM

## 2011-12-25 LAB — CBC WITH DIFFERENTIAL/PLATELET
Basophils Absolute: 0.1 10*3/uL (ref 0.0–0.1)
Eosinophils Absolute: 0.2 10*3/uL (ref 0.0–0.5)
HGB: 15.3 g/dL (ref 13.0–17.1)
LYMPH%: 15.8 % (ref 14.0–49.0)
MCV: 87.5 fL (ref 79.3–98.0)
MONO#: 1.4 10*3/uL — ABNORMAL HIGH (ref 0.1–0.9)
MONO%: 9.4 % (ref 0.0–14.0)
NEUT#: 11 10*3/uL — ABNORMAL HIGH (ref 1.5–6.5)
Platelets: 94 10*3/uL — ABNORMAL LOW (ref 140–400)
WBC: 15 10*3/uL — ABNORMAL HIGH (ref 4.0–10.3)
nRBC: 0 % (ref 0–0)

## 2011-12-25 MED ORDER — LEVOFLOXACIN 500 MG PO TABS: 500.0000 mg | ORAL_TABLET | Freq: Every day | ORAL | Status: AC

## 2011-12-25 NOTE — Progress Notes (Signed)
[facility]  Telephone:([phone] Fax:([phone]) [phone]   OFFICE PROGRESS NOTE   Cc:  Nadean Corwin, MD, MD    DIAGNOSIS:  ITP.   PAST THERAPY: No brisk response with Prednisone. Therefore, Prednisone was tapered off. He finished the last Prenisone done on 10/01/11.   CURRENT THERAPY:  Watchful observation.   INTERVAL HISTORY: [name] 51 y.o. male returns for regular follow up.  For the past 6 days, he has been having productive cough with bilateral pleurisy.  His friend with whom he spends a lot of time with recently came down with pneumonia last week.  [name] denies fever, SOB.  However, he has a lot of nasal congestion and drainage.  When he takes in deep breath or coughs, he has bilateral lower rib pains.  He has been taking over the counter Mucinex and decongestant over the past two days without improvement.  He has some mild fatigue due to inability to sleep due to his cough.    Patient denies fatigue, headache, visual changes, confusion, drenching night sweats, palpable lymph node swelling, mucositis, odynophagia, dysphagia, nausea vomiting, jaundice,gum bleeding, epistaxis, hematemesis, hemoptysis, abdominal pain, abdominal swelling, early satiety, melena, hematochezia, hematuria, skin rash, spontaneous bleeding, joint swelling, joint pain, heat or cold intolerance, bowel bladder incontinence, back pain, focal motor weakness, paresthesia, depression, suicidal or homocidal ideation, feeling hopelessness.   Past Medical History  Diagnosis Date  . Acid reflux   . Hypercholesteremia   . Morbid obesity   . Bruise 08/08/11    pt states brusing x 1 month  . Tick bites 08/08/11    60 to 70 ticks in last hunting season , last > 1 month  . 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 Chest pain 08/08/2011    "a little in last month"  . Leg cramps 08/08/2011    bad in last 1.5 months  .  Sleep apnea   . Arthritis     Past Surgical History  Procedure Date  . Back surgery 07/2010    lumbar - 3 discs    Current Outpatient Prescriptions  Medication Sig Dispense Refill  . ALPRAZolam (XANAX PO) Take 1 mg by mouth 3 (three) times daily as needed. Anxiety       . HYDROcodone-acetaminophen (NORCO) 5-325 MG per tablet Take by mouth. As needed      . levofloxacin (LEVAQUIN) 500 MG tablet Take 1 tablet (500 mg total) by mouth daily.  7 tablet  0  . omeprazole (PRILOSEC) 40 MG capsule Take 40 mg by mouth daily.          ALLERGIES:  is allergic to niacin and related and penicillins.  REVIEW OF SYSTEMS:  The rest of the 14-point review of system was negative.   Filed Vitals:   12/25/11 0934  BP: 134/79  Pulse: 102  Temp: 96.6 F (35.9 C)   Wt Readings from Last 3 Encounters:  12/25/11 229 lb 4.8 oz (104.01 kg)  10/09/11 226 lb 12.8 oz (102.876 kg)  09/09/11 237 lb (107.502 kg)   ECOG Performance status: 0  PHYSICAL EXAMINATION:  General: well-nourished man, in no acute distress. There was no increased work of breathing.  Eyes: no scleral icterus. ENT: There were no oropharyngeal lesions. Neck was without thyromegaly. Lymphatics: Negative cervical, supraclavicular or axillary adenopathy. Respiratory: lungs showed bibasilar inspiratory crackles. Cardiovascular: Regular rate and rhythm, S1/S2, without murmur,  rub or gallop. There was no pedal edema. GI: abdomen was soft, flat, nontender, nondistended, without organomegaly. Muscoloskeletal: no spinal tenderness of palpation of vertebral spine. I could not illicit any pain in palpation of right shoulder, back, or hip. Skin exam was without echymosis, petichae. Neuro exam was nonfocal. Patient was able to get on and off exam table without assistance. Gait was normal. Patient was alerted and oriented. Attention was good. Language was appropriate. Mood was normal without depression. Speech was not pressured. Thought content was not  tangential.    LABORATORY/RADIOLOGY DATA:  Lab Results  Component Value Date   WBC 15.0* 12/25/2011   HGB 15.3 12/25/2011   HCT 45.8 12/25/2011   PLT 94* 12/25/2011   GLUCOSE 158* 08/28/2011   CHOL 97 08/09/2011   TRIG 44 08/09/2011   HDL 33* 08/09/2011   LDLCALC 55 08/09/2011   ALKPHOS 53 08/28/2011   ALT 20 08/28/2011   AST 13 08/28/2011   NA 135 08/28/2011   K 4.4 08/28/2011   CL 102 08/28/2011   CREATININE 1.02 08/28/2011   BUN 20 08/28/2011   CO2 25 08/28/2011   INR 0.94 09/09/2011     ASSESSMENT AND PLAN:   1. ITP: stable plt.  Extensive work up in the past including hepatitis, HIV, bone marrow biopsy were all negative.  I advised him that with plt >30, there is low risk of spontaneous hemorrhage.  I recommended again watchful observation.    2. Right hip pain, shoulder pain: not a major issue compared to last visit.  He takes OTC pain meds prn.   3.  Cough, leukocytosis, pleurisy:  Presumed community-acquired pneumonia.  There is no indication for admission given his young age and lack of major comorbidity.  He has allergy to PCN.  I gave him a 7 day-supply of Levaquin 500mg  PO daily.  I strongly urged him to follow up with his PCP if his condition worsens on Levaquin.    4. Follow up: q month CBC. I'll see him in about 4-6 months.      The length of time of the face-to-face encounter was 15 minutes. More than 50% of time was spent counseling and coordination of care.

## 2011-12-25 NOTE — Patient Instructions (Signed)
ITP (immune thrombocytopenic purpura:   Immune destruction of platelet count). - stable platelet count today. - continue observation.   - Since platelet has been stable, we will space out the CBC check to once a month. - Followup with Dr. Gaylyn Rong' nurse practitioner Belenda Cruise in about 6 months

## 2011-12-25 NOTE — Telephone Encounter (Signed)
gve the pt his June-nov 2013 appt calendar

## 2012-01-01 ENCOUNTER — Other Ambulatory Visit: Payer: BC Managed Care – PPO | Admitting: Lab

## 2012-01-15 ENCOUNTER — Other Ambulatory Visit: Payer: BC Managed Care – PPO | Admitting: Lab

## 2012-01-15 NOTE — Progress Notes (Signed)
Received lab results from [facility] ordered by Dr. Lucky Cowboy; forwarded to Dr. Gaylyn Rong.

## 2012-01-22 ENCOUNTER — Other Ambulatory Visit (HOSPITAL_BASED_OUTPATIENT_CLINIC_OR_DEPARTMENT_OTHER): Payer: BC Managed Care – PPO | Admitting: Lab

## 2012-01-22 ENCOUNTER — Telehealth: Payer: Self-pay | Admitting: *Deleted

## 2012-01-22 ENCOUNTER — Telehealth: Payer: Self-pay | Admitting: Oncology

## 2012-01-22 DIAGNOSIS — D693 Immune thrombocytopenic purpura: Secondary | ICD-10-CM

## 2012-01-22 DIAGNOSIS — J189 Pneumonia, unspecified organism: Secondary | ICD-10-CM

## 2012-01-22 DIAGNOSIS — J329 Chronic sinusitis, unspecified: Secondary | ICD-10-CM

## 2012-01-22 LAB — CBC WITH DIFFERENTIAL/PLATELET
Basophils Absolute: 0.1 10*3/uL (ref 0.0–0.1)
EOS%: 3.7 % (ref 0.0–7.0)
HGB: 14.6 g/dL (ref 13.0–17.1)
MCH: 30.5 pg (ref 27.2–33.4)
MONO#: 0.6 10*3/uL (ref 0.1–0.9)
NEUT#: 7 10*3/uL — ABNORMAL HIGH (ref 1.5–6.5)
RDW: 13.2 % (ref 11.0–14.6)
WBC: 10.8 10*3/uL — ABNORMAL HIGH (ref 4.0–10.3)
lymph#: 2.7 10*3/uL (ref 0.9–3.3)

## 2012-01-22 NOTE — Telephone Encounter (Signed)
Called pt w/ results of Platelet count and stable per Dr. Gaylyn Rong.   Informed Dr. Gaylyn Rong decreasing labs from monthly to every 2 months.  Pt verbalized understanding. POF sent to scheduling.

## 2012-01-22 NOTE — Telephone Encounter (Signed)
called pt and informed her that july and sept appts were cancelled

## 2012-01-22 NOTE — Telephone Encounter (Signed)
Message copied by Wende Mott on Thu Jan 22, 2012  3:02 PM ------      Message from: HA, Raliegh Ip T      Created: Thu Jan 22, 2012  8:23 AM       Please call pt.  His ITP is stable.  I again recommend observation. I think that it's safe to decrease CBC to every 2 months now (instead of monthly).   So, next lab is in August 2013.  Thanks.

## 2012-02-20 ENCOUNTER — Other Ambulatory Visit: Payer: BC Managed Care – PPO | Admitting: Lab

## 2012-03-18 ENCOUNTER — Other Ambulatory Visit (HOSPITAL_BASED_OUTPATIENT_CLINIC_OR_DEPARTMENT_OTHER): Payer: BC Managed Care – PPO | Admitting: Lab

## 2012-03-18 DIAGNOSIS — J189 Pneumonia, unspecified organism: Secondary | ICD-10-CM

## 2012-03-18 DIAGNOSIS — J329 Chronic sinusitis, unspecified: Secondary | ICD-10-CM

## 2012-03-18 DIAGNOSIS — D693 Immune thrombocytopenic purpura: Secondary | ICD-10-CM

## 2012-03-18 DIAGNOSIS — D696 Thrombocytopenia, unspecified: Secondary | ICD-10-CM

## 2012-03-18 LAB — CBC WITH DIFFERENTIAL/PLATELET
BASO%: 0.8 % (ref 0.0–2.0)
Basophils Absolute: 0.1 10*3/uL (ref 0.0–0.1)
EOS%: 3.7 % (ref 0.0–7.0)
Eosinophils Absolute: 0.3 10*3/uL (ref 0.0–0.5)
HCT: 44.5 % (ref 38.4–49.9)
HGB: 15.2 g/dL (ref 13.0–17.1)
LYMPH%: 32.5 % (ref 14.0–49.0)
MCH: 30.4 pg (ref 27.2–33.4)
MCHC: 34.2 g/dL (ref 32.0–36.0)
MCV: 88.8 fL (ref 79.3–98.0)
MONO#: 0.6 10*3/uL (ref 0.1–0.9)
MONO%: 6.9 % (ref 0.0–14.0)
NEUT#: 4.9 10*3/uL (ref 1.5–6.5)
NEUT%: 56.1 % (ref 39.0–75.0)
Platelets: 93 10*3/uL — ABNORMAL LOW (ref 140–400)
RBC: 5.01 10*6/uL (ref 4.20–5.82)
RDW: 13.3 % (ref 11.0–14.6)
WBC: 8.8 10*3/uL (ref 4.0–10.3)
lymph#: 2.9 10*3/uL (ref 0.9–3.3)

## 2012-03-22 ENCOUNTER — Telehealth: Payer: Self-pay | Admitting: *Deleted

## 2012-03-22 NOTE — Telephone Encounter (Signed)
Pt called for results of his Platelets from last week.  Informed him of result stable per Dr. Gaylyn Rong and to continue observation. Keep next lab appt in Oct as scheduled.  Pt verbalized understanding.

## 2012-04-22 ENCOUNTER — Other Ambulatory Visit: Payer: BC Managed Care – PPO | Admitting: Lab

## 2012-05-20 ENCOUNTER — Telehealth: Payer: Self-pay

## 2012-05-20 ENCOUNTER — Other Ambulatory Visit: Payer: BC Managed Care – PPO

## 2012-05-20 DIAGNOSIS — D693 Immune thrombocytopenic purpura: Secondary | ICD-10-CM

## 2012-05-20 DIAGNOSIS — J189 Pneumonia, unspecified organism: Secondary | ICD-10-CM

## 2012-05-20 DIAGNOSIS — J329 Chronic sinusitis, unspecified: Secondary | ICD-10-CM

## 2012-05-20 LAB — CBC WITH DIFFERENTIAL/PLATELET
BASO%: 1 % (ref 0.0–2.0)
Basophils Absolute: 0.1 10*3/uL (ref 0.0–0.1)
EOS%: 2.7 % (ref 0.0–7.0)
HCT: 44.2 % (ref 38.4–49.9)
HGB: 15 g/dL (ref 13.0–17.1)
LYMPH%: 18 % (ref 14.0–49.0)
MCH: 30.1 pg (ref 27.2–33.4)
MCHC: 33.9 g/dL (ref 32.0–36.0)
MCV: 88.8 fL (ref 79.3–98.0)
MONO%: 6.9 % (ref 0.0–14.0)
NEUT%: 71.4 % (ref 39.0–75.0)
Platelets: 101 10*3/uL — ABNORMAL LOW (ref 140–400)

## 2012-05-20 NOTE — Telephone Encounter (Signed)
Message copied by Kallie Locks on Thu May 20, 2012  4:45 PM ------      Message from: HA, Raliegh Ip T      Created: Thu May 20, 2012  9:05 AM       Please call pt. His ITP is stable off of therapy.  Continue observation.  Thanks.

## 2012-05-20 NOTE — Progress Notes (Signed)
Received lab results from [facility]; forwarded to Dr. Ha. 

## 2012-06-03 ENCOUNTER — Other Ambulatory Visit: Payer: Self-pay | Admitting: Gastroenterology

## 2012-06-25 ENCOUNTER — Ambulatory Visit: Payer: BC Managed Care – PPO | Admitting: Oncology

## 2012-06-28 ENCOUNTER — Encounter: Payer: Self-pay | Admitting: Oncology

## 2012-06-28 ENCOUNTER — Other Ambulatory Visit (HOSPITAL_BASED_OUTPATIENT_CLINIC_OR_DEPARTMENT_OTHER): Payer: BC Managed Care – PPO

## 2012-06-28 ENCOUNTER — Telehealth: Payer: Self-pay | Admitting: Oncology

## 2012-06-28 ENCOUNTER — Ambulatory Visit (HOSPITAL_BASED_OUTPATIENT_CLINIC_OR_DEPARTMENT_OTHER): Payer: BC Managed Care – PPO | Admitting: Oncology

## 2012-06-28 VITALS — BP 133/82 | HR 66 | Temp 97.3°F | Resp 20 | Ht 70.0 in | Wt 229.4 lb

## 2012-06-28 DIAGNOSIS — J189 Pneumonia, unspecified organism: Secondary | ICD-10-CM

## 2012-06-28 DIAGNOSIS — D696 Thrombocytopenia, unspecified: Secondary | ICD-10-CM

## 2012-06-28 DIAGNOSIS — D693 Immune thrombocytopenic purpura: Secondary | ICD-10-CM

## 2012-06-28 DIAGNOSIS — J329 Chronic sinusitis, unspecified: Secondary | ICD-10-CM

## 2012-06-28 LAB — CBC WITH DIFFERENTIAL/PLATELET
Basophils Absolute: 0 10*3/uL (ref 0.0–0.1)
EOS%: 3.5 % (ref 0.0–7.0)
Eosinophils Absolute: 0.3 10*3/uL (ref 0.0–0.5)
HCT: 43.8 % (ref 38.4–49.9)
HGB: 15 g/dL (ref 13.0–17.1)
LYMPH%: 29.2 % (ref 14.0–49.0)
MCH: 29.9 pg (ref 27.2–33.4)
MCV: 87.4 fL (ref 79.3–98.0)
MONO%: 7.3 % (ref 0.0–14.0)
NEUT#: 4.9 10*3/uL (ref 1.5–6.5)
NEUT%: 59.6 % (ref 39.0–75.0)
Platelets: 100 10*3/uL — ABNORMAL LOW (ref 140–400)
RDW: 12.9 % (ref 11.0–14.6)

## 2012-06-28 NOTE — Patient Instructions (Addendum)
ITP (immune thrombocytopenic purpura: Immune destruction of platelet count).  - stable platelet count today.  - continue observation.  - Since platelet has been stable, will check CBC every other month - Followup in about 6 months

## 2012-06-28 NOTE — Telephone Encounter (Signed)
gv and printed pt appt schedule for Jan, March, and May 2014

## 2012-06-28 NOTE — Progress Notes (Signed)
[facility]  Telephone:([phone] Fax:([phone]   OFFICE PROGRESS NOTE   Cc:  Nadean Corwin, MD    DIAGNOSIS:  ITP.   PAST THERAPY: No brisk response with Prednisone. Therefore, Prednisone was tapered off. He finished the last Prenisone done on 10/01/11.   CURRENT THERAPY:  Watchful observation.   INTERVAL HISTORY: [name] 51 y.o. male returns for regular follow up. He has been doing well. No bleeding noted. States he had a colonoscopy last month with polyps removed. Repeat colonoscopy due in 3 years. No chest pain, shortness of breath, abdominal pain, nausea, or vomiting.     Patient denies fatigue, headache, visual changes, confusion, drenching night sweats, palpable lymph node swelling, mucositis, odynophagia, dysphagia, nausea vomiting, jaundice,gum bleeding, epistaxis, hematemesis, hemoptysis, abdominal pain, abdominal swelling, early satiety, melena, hematochezia, hematuria, skin rash, spontaneous bleeding, joint swelling, joint pain, heat or cold intolerance, bowel bladder incontinence, back pain, focal motor weakness, paresthesia, depression, suicidal or homocidal ideation, feeling hopelessness.   Past Medical History  Diagnosis Date  . Acid reflux   . Hypercholesteremia   . Morbid obesity   . Bruise 08/08/11    pt states brusing x 1 month  . Tick bites 08/08/11    60 to 70 ticks in last hunting season , last > 1 month  . 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 Chest pain 08/08/2011    "a little in last month"  . Leg cramps 08/08/2011    bad in last 1.5 months  . Sleep apnea   . Arthritis     Past Surgical History  Procedure Date  . Back surgery 07/2010    lumbar - 3 discs    Current Outpatient Prescriptions  Medication Sig Dispense Refill  . ALPRAZolam (XANAX PO) Take 1 mg by mouth 3 (three) times daily as needed. Anxiety       . HYDROcodone-acetaminophen  (NORCO) 5-325 MG per tablet Take by mouth. As needed      . omeprazole (PRILOSEC) 40 MG capsule Take 40 mg by mouth daily.          ALLERGIES:  is allergic to niacin and related and penicillins.  REVIEW OF SYSTEMS:  The rest of the 14-point review of system was negative.   Filed Vitals:   06/28/12 0846  BP: 133/82  Pulse: 66  Temp: 97.3 F (36.3 C)  Resp: 20   Wt Readings from Last 3 Encounters:  06/28/12 229 lb 6.4 oz (104.055 kg)  12/25/11 229 lb 4.8 oz (104.01 kg)  10/09/11 226 lb 12.8 oz (102.876 kg)   ECOG Performance status: 0  PHYSICAL EXAMINATION:  General: well-nourished man, in no acute distress. There was no increased work of breathing.  Eyes: no scleral icterus. ENT: There were no oropharyngeal lesions. Neck was without thyromegaly. Lymphatics: Negative cervical, supraclavicular or axillary adenopathy. Respiratory: lungs showed bibasilar inspiratory crackles. Cardiovascular: Regular rate and rhythm, S1/S2, without murmur, rub or gallop. There was no pedal edema. GI: abdomen was soft, flat, nontender, nondistended, without organomegaly. Muscoloskeletal: no spinal tenderness of palpation of vertebral spine. I could not illicit any pain in palpation of right shoulder, back, or hip. Skin exam was without echymosis, petichae. Neuro exam was nonfocal. Patient was able to get on and off exam table without assistance. Gait was normal. Patient was alerted and oriented. Attention was good. Language was appropriate. Mood was normal without  depression. Speech was not pressured. Thought content was not tangential.    LABORATORY/RADIOLOGY DATA:  Lab Results  Component Value Date   WBC 8.3 06/28/2012   HGB 15.0 06/28/2012   HCT 43.8 06/28/2012   PLT 100* 06/28/2012   GLUCOSE 158* 08/28/2011   CHOL 97 08/09/2011   TRIG 44 08/09/2011   HDL 33* 08/09/2011   LDLCALC 55 08/09/2011   ALKPHOS 53 08/28/2011   ALT 20 08/28/2011   AST 13 08/28/2011   NA 135 08/28/2011   K 4.4 08/28/2011    CL 102 08/28/2011   CREATININE 1.02 08/28/2011   BUN 20 08/28/2011   CO2 25 08/28/2011   INR 0.94 09/09/2011     ASSESSMENT AND PLAN:   1. ITP: stable plt.  Extensive work up in the past including hepatitis, HIV, bone marrow biopsy were all negative.  I advised him that with plt >30, there is low risk of spontaneous hemorrhage.  I recommended again watchful observation.    2. Right hip pain, shoulder pain: not a major issue compared to last visit.  He takes OTC pain meds prn.   3. Health maintenance: Colonoscopy was performed in October 2013 and there were polyps removed. Repeat is due in October 2016. He had his flu vaccine already this year per PCP.   4. Follow up: CBC every other month. Visit in 6 months.     The length of time of the face-to-face encounter was 15 minutes. More than 50% of time was spent counseling and coordination of care.

## 2012-08-25 NOTE — Progress Notes (Signed)
Received lab results from [facility]; forwarded to Dr. Gaylyn Rong.

## 2012-08-27 ENCOUNTER — Telehealth: Payer: Self-pay | Admitting: *Deleted

## 2012-08-27 ENCOUNTER — Other Ambulatory Visit (HOSPITAL_BASED_OUTPATIENT_CLINIC_OR_DEPARTMENT_OTHER): Payer: BC Managed Care – PPO | Admitting: Lab

## 2012-08-27 DIAGNOSIS — J329 Chronic sinusitis, unspecified: Secondary | ICD-10-CM

## 2012-08-27 DIAGNOSIS — J189 Pneumonia, unspecified organism: Secondary | ICD-10-CM

## 2012-08-27 DIAGNOSIS — D693 Immune thrombocytopenic purpura: Secondary | ICD-10-CM

## 2012-08-27 LAB — CBC WITH DIFFERENTIAL/PLATELET
EOS%: 3.9 % (ref 0.0–7.0)
Eosinophils Absolute: 0.3 10*3/uL (ref 0.0–0.5)
LYMPH%: 23.1 % (ref 14.0–49.0)
MCH: 30.6 pg (ref 27.2–33.4)
MCV: 88.4 fL (ref 79.3–98.0)
MONO%: 8.1 % (ref 0.0–14.0)
NEUT#: 5.2 10*3/uL (ref 1.5–6.5)
Platelets: 96 10*3/uL — ABNORMAL LOW (ref 140–400)
RBC: 4.94 10*6/uL (ref 4.20–5.82)

## 2012-08-27 NOTE — Telephone Encounter (Signed)
Message copied by Wende Mott on Fri Aug 27, 2012  5:01 PM ------      Message from: HA, Raliegh Ip T      Created: Fri Aug 27, 2012  9:12 AM       Please call pt. His ITP is stable off of treatment.  Continue observation.  Thanks.

## 2012-08-27 NOTE — Telephone Encounter (Signed)
Called pt w/ lab results and Dr. Lodema Pilot message below.  Keep lab appt in March as scheduled.  Pt verbalized understanding.

## 2012-09-13 IMAGING — CT CT BIOPSY
1 series · 1 of 16 positions shown · non-contrast
Comparison: none

INDICATION: Refractory thrombocytopenia

[Series 2: localizer · axial · 5.0mm · 0.62mm/px · 1 of 16 slices shown]
[im 9/16]
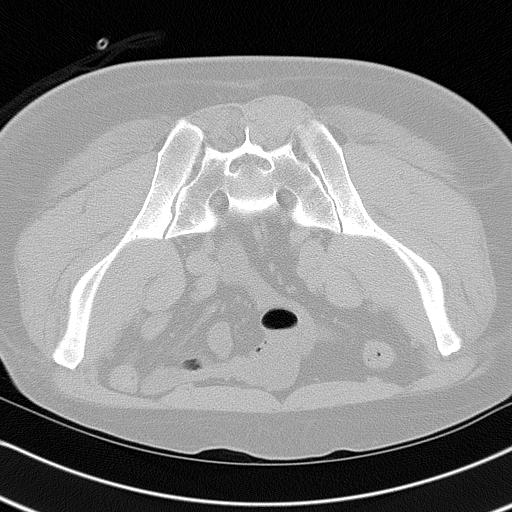

[1 of 16 positions shown; findings below may reference images not displayed]

CT GUIDED LEFT ILIAC BONE MARROW ASPIRATION AND BONE MARROW CORE
BIOPSIES

Medications: Fentanyl 100 mcg IV; Versed 4 mg IV

Sedation time: 15 minutes

Contrast volume: None

Complications: None immediate

PROCEDURE/FINDINGS:

Informed consent was obtained from the patient following an
explanation of the procedure, risks, benefits and alternatives.
The patient understands, agrees and consents for the procedure.
All questions were addressed.  A time out was performed prior to
the initiation of the procedure.  The patient was positioned prone
and noncontrast localization CT was performed of the pelvis to
demonstrate the iliac marrow spaces.  The operative site was
prepped and draped in the usual sterile fashion.

Under sterile conditions and local anesthesia, an 11 gauge coaxial
bone biopsy needle was advanced into the left iliac marrow space.
Needle position was confirmed with CT imaging. Initially, bone
marrow aspiration was performed. Next, 2 bone marrow biopsies were
obtained with a 10 gauge biopsy needle.  Positioning was again
confirmed with CT and a final bone marrow biopsy was obtained with
the 11 gauge outer bone marrow device.  Samples were prepared with
the cytotechnologist.  The needle was removed intact.  Hemostasis
was obtained with compression and a dressing was placed. The
patient tolerated the procedure well without immediate post
procedural complication.
IMPRESSION: Successful CT guided left iliac bone marrow aspiration and core
biopsies.

## 2012-10-29 ENCOUNTER — Other Ambulatory Visit (HOSPITAL_BASED_OUTPATIENT_CLINIC_OR_DEPARTMENT_OTHER): Payer: BC Managed Care – PPO

## 2012-10-29 ENCOUNTER — Telehealth: Payer: Self-pay

## 2012-10-29 DIAGNOSIS — D696 Thrombocytopenia, unspecified: Secondary | ICD-10-CM

## 2012-10-29 DIAGNOSIS — D693 Immune thrombocytopenic purpura: Secondary | ICD-10-CM

## 2012-10-29 LAB — CBC WITH DIFFERENTIAL/PLATELET
Basophils Absolute: 0 10*3/uL (ref 0.0–0.1)
EOS%: 2.9 % (ref 0.0–7.0)
Eosinophils Absolute: 0.3 10*3/uL (ref 0.0–0.5)
MCHC: 34.6 g/dL (ref 32.0–36.0)
MCV: 87.7 fL (ref 79.3–98.0)
NEUT%: 69.4 % (ref 39.0–75.0)
Platelets: 106 10*3/uL — ABNORMAL LOW (ref 140–400)
RBC: 4.98 10*6/uL (ref 4.20–5.82)
WBC: 10 10*3/uL (ref 4.0–10.3)
lymph#: 2.1 10*3/uL (ref 0.9–3.3)

## 2012-10-29 NOTE — Telephone Encounter (Signed)
Message copied by Kallie Locks on Fri Oct 29, 2012  5:01 PM ------      Message from: Myrtis Ser      Created: Fri Oct 29, 2012  8:40 AM       Call pt. Plt count remains stable off treatment. ------

## 2012-12-24 ENCOUNTER — Other Ambulatory Visit (HOSPITAL_BASED_OUTPATIENT_CLINIC_OR_DEPARTMENT_OTHER): Payer: BC Managed Care – PPO | Admitting: Lab

## 2012-12-24 ENCOUNTER — Ambulatory Visit (HOSPITAL_BASED_OUTPATIENT_CLINIC_OR_DEPARTMENT_OTHER): Payer: BC Managed Care – PPO | Admitting: Oncology

## 2012-12-24 VITALS — BP 127/83 | HR 55 | Temp 97.4°F | Resp 20 | Ht 70.0 in | Wt 223.0 lb

## 2012-12-24 DIAGNOSIS — D696 Thrombocytopenia, unspecified: Secondary | ICD-10-CM

## 2012-12-24 LAB — CBC WITH DIFFERENTIAL/PLATELET
Basophils Absolute: 0 10*3/uL (ref 0.0–0.1)
EOS%: 3.5 % (ref 0.0–7.0)
Eosinophils Absolute: 0.3 10*3/uL (ref 0.0–0.5)
HCT: 41.4 % (ref 38.4–49.9)
HGB: 14.1 g/dL (ref 13.0–17.1)
LYMPH%: 34.6 % (ref 14.0–49.0)
MCH: 29.8 pg (ref 27.2–33.4)
MCV: 87.5 fL (ref 79.3–98.0)
MONO%: 8.4 % (ref 0.0–14.0)
NEUT#: 4 10*3/uL (ref 1.5–6.5)
NEUT%: 52.8 % (ref 39.0–75.0)
Platelets: 92 10*3/uL — ABNORMAL LOW (ref 140–400)
RDW: 13.4 % (ref 11.0–14.6)

## 2012-12-24 NOTE — Progress Notes (Signed)
[facility]  Telephone:(336) [phone] Fax:(336) [phone]   OFFICE PROGRESS NOTE   Cc:  Nadean Corwin, MD    DIAGNOSIS:  ITP.   PAST THERAPY: No brisk response with Prednisone. Therefore, Prednisone was tapered off. He finished the last Prenisone on 10/01/11.   CURRENT THERAPY:  Watchful observation.   INTERVAL HISTORY: [name] 52 y.o. male returns for regular follow up by himself. He is feeling well. He denies any visible bleeding symptoms. The rest of the 14 point review of system was negative. He enjoys hunting and fishing. He has mild fatigue and insomnia at night.  He denies fever, anorexia, weight loss, headache, visual changes, confusion, drenching night sweats, palpable lymph node swelling, mucositis, odynophagia, dysphagia, nausea vomiting, jaundice, chest pain, palpitation, shortness of breath, dyspnea on exertion, productive cough, gum bleeding, epistaxis, hematemesis, hemoptysis, abdominal pain, abdominal swelling, early satiety, melena, hematochezia, hematuria, skin rash, spontaneous bleeding, joint swelling, joint pain, heat or cold intolerance, bowel bladder incontinence, back pain, focal motor weakness, paresthesia, depression.  He denies heavy alcohol consumption except for maybe 6 beers every 1-2 weeks.     Past Medical History  Diagnosis Date  . Acid reflux   . Hypercholesteremia   . Morbid obesity   . Bruise 08/08/11    pt states brusing x 1 month  . Tick bites 08/08/11    60 to 70 ticks in last hunting season , last > 1 month  . 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 Chest pain 08/08/2011    "a little in last month"  . Leg cramps 08/08/2011    bad in last 1.5 months  . Sleep apnea   . Arthritis     Past Surgical History  Procedure Laterality Date  . Back surgery  07/2010    lumbar - 3 discs    Current Outpatient Prescriptions  Medication Sig Dispense  Refill  . ALPRAZolam (XANAX PO) Take 1 mg by mouth 3 (three) times daily as needed. Anxiety       . HYDROcodone-acetaminophen (NORCO) 5-325 MG per tablet Take by mouth. As needed      . omeprazole (PRILOSEC) 40 MG capsule Take 40 mg by mouth daily.         No current facility-administered medications for this visit.    ALLERGIES:  is allergic to niacin and related and penicillins.  REVIEW OF SYSTEMS:  The rest of the 14-point review of system was negative.   Filed Vitals:   12/24/12 0849  BP: 127/83  Pulse: 55  Temp: 97.4 F (36.3 C)  Resp: 20   Wt Readings from Last 3 Encounters:  12/24/12 223 lb (101.152 kg)  06/28/12 229 lb 6.4 oz (104.055 kg)  12/25/11 229 lb 4.8 oz (104.01 kg)   ECOG Performance status: 0  PHYSICAL EXAMINATION:  General: well-nourished man, in no acute distress. There was no increased work of breathing.  Eyes: no scleral icterus. ENT: There were no oropharyngeal lesions. Neck was without thyromegaly. Lymphatics: Negative cervical, supraclavicular or axillary adenopathy. Respiratory: lungs showed bibasilar inspiratory crackles. Cardiovascular: Regular rate and rhythm, S1/S2, without murmur, rub or gallop. There was no pedal edema. GI: abdomen was soft, flat, nontender, nondistended, without organomegaly. Muscoloskeletal: no spinal tenderness of palpation of vertebral spine. I could not illicit any pain in palpation of right shoulder, back, or hip. Skin exam was without echymosis, petichae. Neuro exam was  nonfocal. Patient was able to get on and off exam table without assistance. Gait was normal. Patient was alert and oriented. Attention was good. Language was appropriate. Mood was normal without depression. Speech was not pressured. Thought content was not tangential.    LABORATORY/RADIOLOGY DATA:  Lab Results  Component Value Date   WBC 7.6 12/24/2012   HGB 14.1 12/24/2012   HCT 41.4 12/24/2012   PLT 92* 12/24/2012   GLUCOSE 158* 08/28/2011   CHOL 97 08/09/2011    TRIG 44 08/09/2011   HDL 33* 08/09/2011   LDLCALC 55 08/09/2011   ALKPHOS 53 08/28/2011   ALT 20 08/28/2011   AST 13 08/28/2011   NA 135 08/28/2011   K 4.4 08/28/2011   CL 102 08/28/2011   CREATININE 1.02 08/28/2011   BUN 20 08/28/2011   CO2 25 08/28/2011   INR 0.94 09/09/2011     ASSESSMENT AND PLAN:   1. ITP: stable plt.  Extensive work up in the past including hepatitis, HIV, bone marrow biopsy were all negative.  I advised him that with plt >30, there is low risk of spontaneous hemorrhage.  I recommended again watchful observation.    2.  history of colon polyps: His next surveillance colonoscopy will be in about 3 ears.   3.  Disposition: His thrombocytopenia from presumed ITP has been quite stable for the past 2 years.  I recommend two options for followup. One option was to return to clinic in about one year with six-month CBC here the cancer [facility]. Another option is to follow with PCP and return to clinic as needed for platelets less than 50. He elected to follow up with his PCP. He is always welcomed to return to the Cancer Center if the need ever arises.        The length of time of the face-to-face encounter was 10 minutes. More than 50% of time was spent counseling and coordination of care.

## 2013-02-25 ENCOUNTER — Other Ambulatory Visit (HOSPITAL_BASED_OUTPATIENT_CLINIC_OR_DEPARTMENT_OTHER): Payer: BC Managed Care – PPO | Admitting: Lab

## 2013-02-25 ENCOUNTER — Telehealth: Payer: Self-pay

## 2013-02-25 DIAGNOSIS — D696 Thrombocytopenia, unspecified: Secondary | ICD-10-CM

## 2013-02-25 LAB — CBC WITH DIFFERENTIAL/PLATELET
BASO%: 0.3 % (ref 0.0–2.0)
Basophils Absolute: 0 10*3/uL (ref 0.0–0.1)
EOS%: 3.6 % (ref 0.0–7.0)
HCT: 41.6 % (ref 38.4–49.9)
HGB: 14.1 g/dL (ref 13.0–17.1)
LYMPH%: 24.2 % (ref 14.0–49.0)
MCH: 29.7 pg (ref 27.2–33.4)
MCHC: 33.9 g/dL (ref 32.0–36.0)
MONO#: 0.6 10*3/uL (ref 0.1–0.9)
NEUT%: 65.4 % (ref 39.0–75.0)
Platelets: 94 10*3/uL — ABNORMAL LOW (ref 140–400)
lymph#: 2.2 10*3/uL (ref 0.9–3.3)

## 2013-02-25 NOTE — Telephone Encounter (Signed)
Message copied by Kallie Locks on Fri Feb 25, 2013  2:51 PM ------      Message from: Myrtis Ser      Created: Fri Feb 25, 2013  1:57 PM       Please call pt. Plt remains stable off treatment. Continue observation. ------

## 2013-04-29 ENCOUNTER — Other Ambulatory Visit (HOSPITAL_BASED_OUTPATIENT_CLINIC_OR_DEPARTMENT_OTHER): Payer: BC Managed Care – PPO | Admitting: Lab

## 2013-04-29 DIAGNOSIS — D696 Thrombocytopenia, unspecified: Secondary | ICD-10-CM

## 2013-04-29 LAB — CBC WITH DIFFERENTIAL/PLATELET
BASO%: 0.7 % (ref 0.0–2.0)
Basophils Absolute: 0.1 10*3/uL (ref 0.0–0.1)
EOS%: 3.4 % (ref 0.0–7.0)
HCT: 42.6 % (ref 38.4–49.9)
HGB: 14.5 g/dL (ref 13.0–17.1)
MCH: 29.9 pg (ref 27.2–33.4)
MCHC: 34.1 g/dL (ref 32.0–36.0)
MCV: 87.9 fL (ref 79.3–98.0)
MONO%: 7.5 % (ref 0.0–14.0)
NEUT%: 62.4 % (ref 39.0–75.0)

## 2013-05-04 ENCOUNTER — Telehealth: Payer: Self-pay | Admitting: *Deleted

## 2013-05-04 NOTE — Telephone Encounter (Signed)
Informed pt of Platelet count stable.  Pt states will continue to follow w/ PCP for labs and will call for appt here if platelets drop less than 50, as suggested by Dr.Ha on his last office visit.

## 2013-05-04 NOTE — Telephone Encounter (Signed)
Message copied by Wende Mott on Wed May 04, 2013 10:29 AM ------      Message from: Clenton Pare R      Created: Mon May 02, 2013  1:31 PM       Please call pt. Plt remains stable. Continue observation with PCP. ------

## 2013-10-16 ENCOUNTER — Other Ambulatory Visit: Payer: Self-pay | Admitting: Internal Medicine

## 2013-10-27 DIAGNOSIS — R7303 Prediabetes: Secondary | ICD-10-CM | POA: Insufficient documentation

## 2013-10-27 DIAGNOSIS — K2100 Gastro-esophageal reflux disease with esophagitis, without bleeding: Secondary | ICD-10-CM | POA: Insufficient documentation

## 2013-10-27 DIAGNOSIS — D693 Immune thrombocytopenic purpura: Secondary | ICD-10-CM | POA: Insufficient documentation

## 2013-11-03 ENCOUNTER — Ambulatory Visit (INDEPENDENT_AMBULATORY_CARE_PROVIDER_SITE_OTHER): Payer: BC Managed Care – PPO | Admitting: Internal Medicine

## 2013-11-03 ENCOUNTER — Encounter: Payer: Self-pay | Admitting: Internal Medicine

## 2013-11-03 VITALS — BP 124/82 | HR 68 | Temp 97.7°F | Resp 16 | Ht 70.0 in | Wt 234.2 lb

## 2013-11-03 DIAGNOSIS — I10 Essential (primary) hypertension: Secondary | ICD-10-CM | POA: Insufficient documentation

## 2013-11-03 DIAGNOSIS — E559 Vitamin D deficiency, unspecified: Secondary | ICD-10-CM

## 2013-11-03 DIAGNOSIS — J189 Pneumonia, unspecified organism: Secondary | ICD-10-CM

## 2013-11-03 DIAGNOSIS — Z79899 Other long term (current) drug therapy: Secondary | ICD-10-CM

## 2013-11-03 DIAGNOSIS — R7303 Prediabetes: Secondary | ICD-10-CM

## 2013-11-03 DIAGNOSIS — Z7689 Persons encountering health services in other specified circumstances: Secondary | ICD-10-CM | POA: Insufficient documentation

## 2013-11-03 DIAGNOSIS — D693 Immune thrombocytopenic purpura: Secondary | ICD-10-CM

## 2013-11-03 DIAGNOSIS — R7309 Other abnormal glucose: Secondary | ICD-10-CM

## 2013-11-03 DIAGNOSIS — Z0000 Encounter for general adult medical examination without abnormal findings: Secondary | ICD-10-CM | POA: Insufficient documentation

## 2013-11-03 DIAGNOSIS — E785 Hyperlipidemia, unspecified: Secondary | ICD-10-CM

## 2013-11-03 DIAGNOSIS — J329 Chronic sinusitis, unspecified: Secondary | ICD-10-CM

## 2013-11-03 HISTORY — DX: Essential (primary) hypertension: I10

## 2013-11-03 LAB — CBC WITH DIFFERENTIAL/PLATELET
Basophils Absolute: 0 10*3/uL (ref 0.0–0.1)
Basophils Relative: 0 % (ref 0–1)
EOS PCT: 3 % (ref 0–5)
Eosinophils Absolute: 0.3 10*3/uL (ref 0.0–0.7)
HEMATOCRIT: 43.1 % (ref 39.0–52.0)
HEMOGLOBIN: 15.1 g/dL (ref 13.0–17.0)
LYMPHS ABS: 2.8 10*3/uL (ref 0.7–4.0)
LYMPHS PCT: 33 % (ref 12–46)
MCH: 29.5 pg (ref 26.0–34.0)
MCHC: 35 g/dL (ref 30.0–36.0)
MCV: 84.3 fL (ref 78.0–100.0)
MONO ABS: 0.6 10*3/uL (ref 0.1–1.0)
Monocytes Relative: 7 % (ref 3–12)
Neutro Abs: 4.8 10*3/uL (ref 1.7–7.7)
Neutrophils Relative %: 57 % (ref 43–77)
Platelets: 109 10*3/uL — ABNORMAL LOW (ref 150–400)
RBC: 5.11 MIL/uL (ref 4.22–5.81)
RDW: 13.6 % (ref 11.5–15.5)
WBC: 8.4 10*3/uL (ref 4.0–10.5)

## 2013-11-03 LAB — HEMOGLOBIN A1C
Hgb A1c MFr Bld: 5.5 % (ref <5.7)
Mean Plasma Glucose: 111 mg/dL (ref <117)

## 2013-11-03 MED ORDER — HYDROCODONE-ACETAMINOPHEN 5-325 MG PO TABS
1.0000 | ORAL_TABLET | ORAL | Status: AC | PRN
Start: 2013-11-03 — End: 2014-01-03

## 2013-11-03 NOTE — Progress Notes (Signed)
Patient ID: [name], male   DOB: 12/14/1960, 53 y.o.   MRN: [id number]    This very nice 53 y.o. [name] presents for 3 month follow up with Hypertension, Hyperlipidemia, Pre-Diabetes and Vitamin D Deficiency. In Dec 2012, he was Dx'd with ITP and was treated by Dr Gaylyn RongHa and was released for PCP F/U as he went into remission.   Patient has labile HTN being monitored expectantly for several years. BP's have normalized after a zealous 55# weight loss betw 2011-2012. BP has been controlled at home. Today's BP: 124/82 mmHg . Patient denies any cardiac type chest pain, palpitations, dyspnea/orthopnea/PND, dizziness, claudication, or dependent edema.   Hyperlipidemia is not controlled with diet. Last Cholesterol was 183, Triglycerides were  181, HDL 25 and LDL elevated at 128. Patient denies myalgias or other med SE's.    Also, the patient has history of PreDiabetes with A1c 5.9% in June 2012 and last A1c was 5.6% in Oct 2014. Patient denies any symptoms of reactive hypoglycemia, diabetic polys, paresthesias or visual blurring.   Further, Patient has history of Vitamin D Deficiency of 20 in 2011 and last vitamin D was 4686 in Oct 2014.Marland Kitchen. Patient supplements vitamin D without any suspected side-effects.  Medication Sig  . ALPRAZolam (XANAX) 1 MG tablet TAKE 1/2 TO 1 TABLET BY MOUTH 3 TIMES A DAY AS NEEDED  . Cholecalciferol (VITAMIN D PO) Take 5,000 Int'l Units by mouth daily.   Norco - 5 1 tab q 4h prn severe back pain  . Omeprazole 40 mg Take 40 mg by mouth daily.        Allergies  Allergen Reactions  . Niacin And Related Swelling    Red rash and swelling  . Gemfibrozil   . Lipitor [Atorvastatin]   . Penicillins Other (See Comments)    Unknown childhood allergy    PMHx:   Past Medical History  Diagnosis Date  . Hypercholesteremia   . Morbid obesity   . Bruise 08/08/11    pt states brusing x 1 month  . Tick bites 08/08/11    60 to 70 ticks in last hunting season , last > 1 month  .  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 Chest pain 08/08/2011    "a little in last month"  . Leg cramps 08/08/2011    bad in last 1.5 months  . Sleep apnea   . Acid reflux   . Arthritis   . Prediabetes   . Idiopathic thrombocytopenic purpura (ITP)   . Hypertension, Labile 11/03/2013    FHx:    Reviewed / unchanged  SHx:    Reviewed / unchanged   Systems Review: Constitutional: Denies fever, chills, wt changes, headaches, insomnia, fatigue, night sweats, change in appetite. Eyes: Denies redness, blurred vision, diplopia, discharge, itchy, watery eyes.  ENT: Denies discharge, congestion, post nasal drip, epistaxis, sore throat, earache, hearing loss, dental pain, tinnitus, vertigo, sinus pain, snoring.  CV: Denies chest pain, palpitations, irregular heartbeat, syncope, dyspnea, diaphoresis, orthopnea, PND, claudication, edema. Respiratory: denies cough, dyspnea, DOE, pleurisy, hoarseness, laryngitis, wheezing.  Gastrointestinal: Denies dysphagia, odynophagia, heartburn, reflux, water brash, abdominal pain or cramps, nausea, vomiting, bloating, diarrhea, constipation, hematemesis, melena, hematochezia,  or hemorrhoids. Genitourinary: Denies dysuria, frequency, urgency, nocturia, hesitancy, discharge, hematuria, flank pain. Musculoskeletal: Denies arthralgias, myalgias, stiffness, jt. swelling, pain, limp, strain/sprain.  Skin: Denies pruritus, rash, hives, warts, acne, eczema, change in skin lesion(s). Neuro:  No weakness, tremor, incoordination, spasms, paresthesia, or pain. Psychiatric: Denies confusion, memory loss, or sensory loss. Endo: Denies change in weight, skin, hair change.  Heme/Lymph: No excessive bleeding, bruising, orenlarged lymph nodes.   Exam:  BP 124/82  Pulse 68  Temp(Src) 97.7 F (36.5 C) (Temporal)  Resp 16  Ht 5\' 10"  (1.778 m)  Wt 234 lb 3.2 oz (106.232 kg)  BMI 33.60 kg/m2  Appears well  nourished - in no distress. Eyes: PERRLA, EOMs, conjunctiva no swelling or erythema. Sinuses: No frontal/maxillary tenderness ENT/Mouth: EAC's clear, TM's nl w/o erythema, bulging. Nares clear w/o erythema, swelling, exudates. Oropharynx clear without erythema or exudates. Oral hygiene is good. Tongue normal, non obstructing. Hearing intact.  Neck: Supple. Thyroid nl. Car 2+/2+ without bruits, nodes or JVD. Chest: Respirations nl with BS clear & equal w/o rales, rhonchi, wheezing or stridor.  Cor: Heart sounds normal w/ regular rate and rhythm without sig. murmurs, gallops, clicks, or rubs. Peripheral pulses normal and equal  without edema.  Abdomen: Soft & bowel sounds normal. Non-tender w/o guarding, rebound, hernias, masses, or organomegaly.  Lymphatics: Unremarkable.  Musculoskeletal: Full ROM all peripheral extremities, joint stability, 5/5 strength, and normal gait.  Skin: Warm, dry without exposed rashes, lesions, ecchymosis apparent.  Neuro: Cranial nerves intact, reflexes equal bilaterally. Sensory-motor testing grossly intact. Tendon reflexes grossly intact.  Pysch: Alert & oriented x 3. Insight and judgement nl & appropriate. No ideations.  Assessment and Plan:  1. Hypertension - Continue monitor blood pressure at home. Continue diet/meds same.  2. Hyperlipidemia - Continue diet/meds, exercise,& lifestyle modifications. Continue monitor periodic cholesterol/liver & renal functions   3. Pre-diabetes/Insulin Resistance - Continue diet, exercise, lifestyle modifications. Monitor appropriate labs.  4. Vitamin D Deficiency - Continue supplementation.  5. GERD  6. ITP, Hx  Recommended regular exercise, BP monitoring, weight control, and discussed med and SE's. Recommended labs to assess and monitor clinical status. Further disposition pending results of labs.

## 2013-11-03 NOTE — Patient Instructions (Signed)
 Hypertension As your heart beats, it forces blood through your arteries. This force is your blood pressure. If the pressure is too high, it is called hypertension (HTN) or high blood pressure. HTN is dangerous because you may have it and not know it. High blood pressure may mean that your heart has to work harder to pump blood. Your arteries may be narrow or stiff. The extra work puts you at risk for heart disease, stroke, and other problems.  Blood pressure consists of two numbers, a higher number over a lower, 110/72, for example. It is stated as "110 over 72." The ideal is below 120 for the top number (systolic) and under 80 for the bottom (diastolic). Write down your blood pressure today. You should pay close attention to your blood pressure if you have certain conditions such as:  Heart failure.  Prior heart attack.  Diabetes  Chronic kidney disease.  Prior stroke.  Multiple risk factors for heart disease. To see if you have HTN, your blood pressure should be measured while you are seated with your arm held at the level of the heart. It should be measured at least twice. A one-time elevated blood pressure reading (especially in the Emergency Department) does not mean that you need treatment. There may be conditions in which the blood pressure is different between your right and left arms. It is important to see your caregiver soon for a recheck. Most people have essential hypertension which means that there is not a specific cause. This type of high blood pressure may be lowered by changing lifestyle factors such as:  Stress.  Smoking.  Lack of exercise.  Excessive weight.  Drug/tobacco/alcohol use.  Eating less salt. Most people do not have symptoms from high blood pressure until it has caused damage to the body. Effective treatment can often prevent, delay or reduce that damage. TREATMENT  When a cause has been identified, treatment for high blood pressure is directed at  the cause. There are a large number of medications to treat HTN. These fall into several categories, and your caregiver will help you select the medicines that are best for you. Medications may have side effects. You should review side effects with your caregiver. If your blood pressure stays high after you have made lifestyle changes or started on medicines,   Your medication(s) may need to be changed.  Other problems may need to be addressed.  Be certain you understand your prescriptions, and know how and when to take your medicine.  Be sure to follow up with your caregiver within the time frame advised (usually within two weeks) to have your blood pressure rechecked and to review your medications.  If you are taking more than one medicine to lower your blood pressure, make sure you know how and at what times they should be taken. Taking two medicines at the same time can result in blood pressure that is too low. SEEK IMMEDIATE MEDICAL CARE IF:  You develop a severe headache, blurred or changing vision, or confusion.  You have unusual weakness or numbness, or a faint feeling.  You have severe chest or abdominal pain, vomiting, or breathing problems. MAKE SURE YOU:   Understand these instructions.  Will watch your condition.  Will get help right away if you are not doing well or get worse.   Diabetes and Exercise Exercising regularly is important. It is not just about losing weight. It has many health benefits, such as:  Improving your overall fitness, flexibility, and   endurance.  Increasing your bone density.  Helping with weight control.  Decreasing your body fat.  Increasing your muscle strength.  Reducing stress and tension.  Improving your overall health. People with diabetes who exercise gain additional benefits because exercise:  Reduces appetite.  Improves the body's use of blood sugar (glucose).  Helps lower or control blood glucose.  Decreases blood  pressure.  Helps control blood lipids (such as cholesterol and triglycerides).  Improves the body's use of the hormone insulin by:  Increasing the body's insulin sensitivity.  Reducing the body's insulin needs.  Decreases the risk for heart disease because exercising:  Lowers cholesterol and triglycerides levels.  Increases the levels of good cholesterol (such as high-density lipoproteins [HDL]) in the body.  Lowers blood glucose levels. YOUR ACTIVITY PLAN  Choose an activity that you enjoy and set realistic goals. Your health care provider or diabetes educator can help you make an activity plan that works for you. You can break activities into 2 or 3 sessions throughout the day. Doing so is as good as one long session. Exercise ideas include:  Taking the dog for a walk.  Taking the stairs instead of the elevator.  Dancing to your favorite song.  Doing your favorite exercise with a friend. RECOMMENDATIONS FOR EXERCISING WITH TYPE 1 OR TYPE 2 DIABETES   Check your blood glucose before exercising. If blood glucose levels are greater than 240 mg/dL, check for urine ketones. Do not exercise if ketones are present.  Avoid injecting insulin into areas of the body that are going to be exercised. For example, avoid injecting insulin into:  The arms when playing tennis.  The legs when jogging.  Keep a record of:  Food intake before and after you exercise.  Expected peak times of insulin action.  Blood glucose levels before and after you exercise.  The type and amount of exercise you have done.  Review your records with your health care provider. Your health care provider will help you to develop guidelines for adjusting food intake and insulin amounts before and after exercising.  If you take insulin or oral hypoglycemic agents, watch for signs and symptoms of hypoglycemia. They include:  Dizziness.  Shaking.  Sweating.  Chills.  Confusion.  Drink plenty of water  while you exercise to prevent dehydration or heat stroke. Body water is lost during exercise and must be replaced.  Talk to your health care provider before starting an exercise program to make sure it is safe for you. Remember, almost any type of activity is better than none.    Cholesterol Cholesterol is a white, waxy, fat-like protein needed by your body in small amounts. The liver makes all the cholesterol you need. It is carried from the liver by the blood through the blood vessels. Deposits (plaque) may build up on blood vessel walls. This makes the arteries narrower and stiffer. Plaque increases the risk for heart attack and stroke. You cannot feel your cholesterol level even if it is very high. The only way to know is by a blood test to check your lipid (fats) levels. Once you know your cholesterol levels, you should keep a record of the test results. Work with your caregiver to to keep your levels in the desired range. WHAT THE RESULTS MEAN:  Total cholesterol is a rough measure of all the cholesterol in your blood.  LDL is the so-called bad cholesterol. This is the type that deposits cholesterol in the walls of the arteries. You want this   level to be low.  HDL is the good cholesterol because it cleans the arteries and carries the LDL away. You want this level to be high.  Triglycerides are fat that the body can either burn for energy or store. High levels are closely linked to heart disease. DESIRED LEVELS:  Total cholesterol below 200.  LDL below 100 for people at risk, below 70 for very high risk.  HDL above 50 is good, above 60 is best.  Triglycerides below 150. HOW TO LOWER YOUR CHOLESTEROL:  Diet.  Choose fish or white meat chicken and turkey, roasted or baked. Limit fatty cuts of red meat, fried foods, and processed meats, such as sausage and lunch meat.  Eat lots of fresh fruits and vegetables. Choose whole grains, beans, pasta, potatoes and cereals.  Use only  small amounts of olive, corn or canola oils. Avoid butter, mayonnaise, shortening or palm kernel oils. Avoid foods with trans-fats.  Use skim/nonfat milk and low-fat/nonfat yogurt and cheeses. Avoid whole milk, cream, ice cream, egg yolks and cheeses. Healthy desserts include angel food cake, ginger snaps, animal crackers, hard candy, popsicles, and low-fat/nonfat frozen yogurt. Avoid pastries, cakes, pies and cookies.  Exercise.  A regular program helps decrease LDL and raises HDL.  Helps with weight control.  Do things that increase your activity level like gardening, walking, or taking the stairs.  Medication.  May be prescribed by your caregiver to help lowering cholesterol and the risk for heart disease.  You may need medicine even if your levels are normal if you have several risk factors. HOME CARE INSTRUCTIONS   Follow your diet and exercise programs as suggested by your caregiver.  Take medications as directed.  Have blood work done when your caregiver feels it is necessary. MAKE SURE YOU:   Understand these instructions.  Will watch your condition.  Will get help right away if you are not doing well or get worse.      Vitamin D Deficiency Vitamin D is an important vitamin that your body needs. Having too little of it in your body is called a deficiency. A very bad deficiency can make your bones soft and can cause a condition called rickets.  Vitamin D is important to your body for different reasons, such as:   It helps your body absorb 2 minerals called calcium and phosphorus.  It helps make your bones healthy.  It may prevent some diseases, such as diabetes and multiple sclerosis.  It helps your muscles and heart. You can get vitamin D in several ways. It is a natural part of some foods. The vitamin is also added to some dairy products and cereals. Some people take vitamin D supplements. Also, your body makes vitamin D when you are in the sun. It changes the  sun's rays into a form of the vitamin that your body can use. CAUSES   Not eating enough foods that contain vitamin D.  Not getting enough sunlight.  Having certain digestive system diseases that make it hard to absorb vitamin D. These diseases include Crohn's disease, chronic pancreatitis, and cystic fibrosis.  Having a surgery in which part of the stomach or small intestine is removed.  Being obese. Fat cells pull vitamin D out of your blood. That means that obese people may not have enough vitamin D left in their blood and in other body tissues.  Having chronic kidney or liver disease. RISK FACTORS Risk factors are things that make you more likely to develop a vitamin   D deficiency. They include:  Being older.  Not being able to get outside very much.  Living in a nursing home.  Having had broken bones.  Having weak or thin bones (osteoporosis).  Having a disease or condition that changes how your body absorbs vitamin D.  Having dark skin.  Some medicines such as seizure medicines or steroids.  Being overweight or obese. SYMPTOMS Mild cases of vitamin D deficiency may not have any symptoms. If you have a very bad case, symptoms may include:  Bone pain.  Muscle pain.  Falling often.  Broken bones caused by a minor injury, due to osteoporosis. DIAGNOSIS A blood test is the best way to tell if you have a vitamin D deficiency. TREATMENT Vitamin D deficiency can be treated in different ways. Treatment for vitamin D deficiency depends on what is causing it. Options include:  Taking vitamin D supplements.  Taking a calcium supplement. Your caregiver will suggest what dose is best for you. HOME CARE INSTRUCTIONS  Take any supplements that your caregiver prescribes. Follow the directions carefully. Take only the suggested amount.  Have your blood tested 2 months after you start taking supplements.  Eat foods that contain vitamin D. Healthy choices  include:  Fortified dairy products, cereals, or juices. Fortified means vitamin D has been added to the food. Check the label on the package to be sure.  Fatty fish like salmon or trout.  Eggs.  Oysters.  Do not use a tanning bed.  Keep your weight at a healthy level. Lose weight if you need to.  Keep all follow-up appointments. Your caregiver will need to perform blood tests to make sure your vitamin D deficiency is going away. SEEK MEDICAL CARE IF:  You have any questions about your treatment.  You continue to have symptoms of vitamin D deficiency.  You have nausea or vomiting.  You are constipated.  You feel confused.  You have severe abdominal or back pain. MAKE SURE YOU:  Understand these instructions.  Will watch your condition.  Will get help right away if you are not doing well or get worse. Gastroesophageal Reflux Disease, Adult Gastroesophageal reflux disease (GERD) happens when acid from your stomach flows up into the esophagus. When acid comes in contact with the esophagus, the acid causes soreness (inflammation) in the esophagus. Over time, GERD may create small holes (ulcers) in the lining of the esophagus. CAUSES  Increased body weight. This puts pressure on the stomach, making acid rise from the stomach into the esophagus. Smoking. This increases acid production in the stomach. Drinking alcohol. This causes decreased pressure in the lower esophageal sphincter (valve or ring of muscle between the esophagus and stomach), allowing acid from the stomach into the esophagus. Late evening meals and a full stomach. This increases pressure and acid production in the stomach. A malformed lower esophageal sphincter. Sometimes, no cause is found. SYMPTOMS  Burning pain in the lower part of the mid-chest behind the breastbone and in the mid-stomach area. This may occur twice a week or more often. Trouble swallowing. Sore throat. Dry cough. Asthma-like symptoms  including chest tightness, shortness of breath, or wheezing. DIAGNOSIS  Your caregiver may be able to diagnose GERD based on your symptoms. In some cases, X-rays and other tests may be done to check for complications or to check the condition of your stomach and esophagus. TREATMENT  Your caregiver may recommend over-the-counter or prescription medicines to help decrease acid production. Ask your caregiver before starting or adding  any new medicines.  HOME CARE INSTRUCTIONS  Change the factors that you can control. Ask your caregiver for guidance concerning weight loss, quitting smoking, and alcohol consumption. Avoid foods and drinks that make your symptoms worse, such as: Caffeine or alcoholic drinks. Chocolate. Peppermint or mint flavorings. Garlic and onions. Spicy foods. Citrus fruits, such as oranges, lemons, or limes. Tomato-based foods such as sauce, chili, salsa, and pizza. Fried and fatty foods. Avoid lying down for the 3 hours prior to your bedtime or prior to taking a nap. Eat small, frequent meals instead of large meals. Wear loose-fitting clothing. Do not wear anything tight around your waist that causes pressure on your stomach. Raise the head of your bed 6 to 8 inches with wood blocks to help you sleep. Extra pillows will not help. Only take over-the-counter or prescription medicines for pain, discomfort, or fever as directed by your caregiver. Do not take aspirin, ibuprofen, or other nonsteroidal anti-inflammatory drugs (NSAIDs). SEEK IMMEDIATE MEDICAL CARE IF:  You have pain in your arms, neck, jaw, teeth, or back. Your pain increases or changes in intensity or duration. You develop nausea, vomiting, or sweating (diaphoresis). You develop shortness of breath, or you faint. Your vomit is green, yellow, black, or looks like coffee grounds or blood. Your stool is red, bloody, or black. These symptoms could be signs of other problems, such as heart disease, gastric  bleeding, or esophageal bleeding. MAKE SURE YOU:  Understand these instructions. Will watch your condition. Will get help right away if you are not doing well or get worse. Document Released: 05/14/2005 Document Revised: 10/27/2011 Document Reviewed: 02/21/2011 [facility] Patient Information 2014 [employer], [place].

## 2013-11-04 LAB — BASIC METABOLIC PANEL WITH GFR
BUN: 10 mg/dL (ref 6–23)
CHLORIDE: 105 meq/L (ref 96–112)
CO2: 26 meq/L (ref 19–32)
CREATININE: 1.04 mg/dL (ref 0.50–1.35)
Calcium: 8.9 mg/dL (ref 8.4–10.5)
GFR, Est African American: >89 mL/min
GFR, Est Non African American: 82 mL/min
Glucose, Bld: 84 mg/dL (ref 70–99)
POTASSIUM: 4.3 meq/L (ref 3.5–5.3)
Sodium: 140 mEq/L (ref 135–145)

## 2013-11-04 LAB — HEPATIC FUNCTION PANEL
ALBUMIN: 4.2 g/dL (ref 3.5–5.2)
ALK PHOS: 87 U/L (ref 39–117)
ALT: 21 U/L (ref 0–53)
AST: 18 U/L (ref 0–37)
BILIRUBIN INDIRECT: 0.3 mg/dL (ref 0.2–1.2)
BILIRUBIN TOTAL: 0.4 mg/dL (ref 0.2–1.2)
Bilirubin, Direct: 0.1 mg/dL (ref 0.0–0.3)
TOTAL PROTEIN: 6.6 g/dL (ref 6.0–8.3)

## 2013-11-04 LAB — INSULIN, FASTING: Insulin fasting, serum: 13 u[IU]/mL (ref 3–28)

## 2013-11-04 LAB — MAGNESIUM: Magnesium: 2 mg/dL (ref 1.5–2.5)

## 2013-11-04 LAB — LIPID PANEL
Cholesterol: 155 mg/dL (ref 0–200)
HDL: 29 mg/dL — AB (ref >39)
LDL CALC: 66 mg/dL (ref 0–99)
Total CHOL/HDL Ratio: 5.3 Ratio
Triglycerides: 301 mg/dL — ABNORMAL HIGH (ref <150)
VLDL: 60 mg/dL — AB (ref 0–40)

## 2013-11-04 LAB — TSH: TSH: 3.784 u[IU]/mL (ref 0.350–4.500)

## 2013-11-04 LAB — VITAMIN D 25 HYDROXY (VIT D DEFICIENCY, FRACTURES): Vit D, 25-Hydroxy: 108 ng/mL — ABNORMAL HIGH (ref 30–89)

## 2013-11-17 ENCOUNTER — Other Ambulatory Visit: Payer: Self-pay | Admitting: Emergency Medicine

## 2013-12-17 ENCOUNTER — Other Ambulatory Visit: Payer: Self-pay | Admitting: Emergency Medicine

## 2013-12-18 ENCOUNTER — Other Ambulatory Visit: Payer: Self-pay | Admitting: Internal Medicine

## 2014-01-16 ENCOUNTER — Other Ambulatory Visit: Payer: Self-pay | Admitting: Physician Assistant

## 2014-02-06 ENCOUNTER — Encounter: Payer: Self-pay | Admitting: Emergency Medicine

## 2014-02-06 ENCOUNTER — Ambulatory Visit (INDEPENDENT_AMBULATORY_CARE_PROVIDER_SITE_OTHER): Payer: BC Managed Care – PPO | Admitting: Emergency Medicine

## 2014-02-06 VITALS — BP 124/82 | HR 68 | Temp 98.0°F | Resp 18 | Ht 70.0 in | Wt 225.0 lb

## 2014-02-06 DIAGNOSIS — E559 Vitamin D deficiency, unspecified: Secondary | ICD-10-CM

## 2014-02-06 DIAGNOSIS — E782 Mixed hyperlipidemia: Secondary | ICD-10-CM

## 2014-02-06 LAB — HEPATIC FUNCTION PANEL
ALK PHOS: 97 U/L (ref 39–117)
ALT: 14 U/L (ref 0–53)
AST: 17 U/L (ref 0–37)
Albumin: 4.5 g/dL (ref 3.5–5.2)
BILIRUBIN DIRECT: 0.1 mg/dL (ref 0.0–0.3)
BILIRUBIN INDIRECT: 0.5 mg/dL (ref 0.2–1.2)
TOTAL PROTEIN: 7.1 g/dL (ref 6.0–8.3)
Total Bilirubin: 0.6 mg/dL (ref 0.2–1.2)

## 2014-02-06 LAB — MAGNESIUM: Magnesium: 2.1 mg/dL (ref 1.5–2.5)

## 2014-02-06 LAB — LIPID PANEL
Cholesterol: 170 mg/dL (ref 0–200)
HDL: 29 mg/dL — AB (ref >39)
LDL CALC: 104 mg/dL — AB (ref 0–99)
Total CHOL/HDL Ratio: 5.9 Ratio
Triglycerides: 183 mg/dL — ABNORMAL HIGH (ref <150)
VLDL: 37 mg/dL (ref 0–40)

## 2014-02-06 MED ORDER — HYDROCODONE-ACETAMINOPHEN 5-325 MG PO TABS: 1.0000 | ORAL_TABLET | Freq: Four times a day (QID) | ORAL | Status: DC | PRN

## 2014-02-06 MED ORDER — DOXYCYCLINE HYCLATE 100 MG PO CAPS: 100.0000 mg | ORAL_CAPSULE | Freq: Two times a day (BID) | ORAL | Status: DC

## 2014-02-06 MED ORDER — MUPIROCIN CALCIUM 2 % EX CREA: TOPICAL_CREAM | CUTANEOUS | Status: DC

## 2014-02-06 NOTE — Progress Notes (Signed)
Subjective:    Patient ID: [name], male    DOB: 10/21/1960, 53 y.o.   MRN: [id number]  HPI Comments: 53 yo WM for 3 month f/u for cholesterol and vit D recheck. [name] takes Norco sparingly for arthritis pain in shoulders and right foot. [name] is trying to improve diet. [name] is staying more active. [name] is eating more fruit. [name] did have fish fry yeaterday.   [name] has recurrent skin infection on/ off of both thighs. [name] notes [name] has tried multiple yeast medications without relief and topical ABX cream. [name] notes infectins have increased since last hospital stay. [name] has never been diagnosed with MRSA nor tested for it.   WBC             8.4   11/03/2013 HGB            15.1   11/03/2013 HCT            43.1   11/03/2013 PLT             109   11/03/2013 GLUCOSE          84   11/03/2013 CHOL            155   11/03/2013 TRIG            301   11/03/2013 HDL              29   11/03/2013 LDLCALC          66   11/03/2013 ALT              21   11/03/2013 AST              18   11/03/2013 NA              140   11/03/2013 K               4.3   11/03/2013 CL              105   11/03/2013 CREATININE     1.04   11/03/2013 BUN              10   11/03/2013 CO2              26   11/03/2013 TSH           3.784   11/03/2013 INR            0.94   09/09/2011 HGBA1C          5.5   11/03/2013   Gastrophageal Reflux      Medication List       This list is accurate as of: 02/06/14  1:50 PM.  Always use your most recent med list.               ALPRAZolam 1 MG tablet  Commonly known as:  XANAX  TAKE 1/2 TO 1 TABLET BY MOUTH 3 TIMES A DAY     omeprazole 40 MG capsule  Commonly known as:  PRILOSEC  TAKE 1 CAPSULE BY MOUTH DAILY     VITAMIN D PO  Take 5,000 Int'l Units by mouth daily.       Allergies  Allergen Reactions  . Niacin And Related Swelling    Red rash and swelling  . Gemfibrozil   . Lipitor [Atorvastatin]   . Penicillins Other (See Comments)    Unknown childhood allergy   Past Medical  History   Diagnosis Date  . Hypercholesteremia   . Morbid obesity   . Bruise 08/08/11    pt states brusing x 1 month  . Tick bites 08/08/11    60 to 70 ticks in last hunting season , last > 1 month  . 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 Chest pain 08/08/2011    "a little in last month"  . Leg cramps 08/08/2011    bad in last 1.5 months  . Sleep apnea   . Acid reflux   . Arthritis   . Prediabetes   . Idiopathic thrombocytopenic purpura (ITP)   . Hypertension, Labile 11/03/2013     Review of Systems  Musculoskeletal: Positive for arthralgias.  Skin: Positive for rash and wound.  All other systems reviewed and are negative.  BP 124/82  Pulse 68  Temp(Src) 98 F (36.7 C) (Temporal)  Resp 18  Ht 5\' 10"  (1.778 m)  Wt 225 lb (102.059 kg)  BMI 32.28 kg/m2     Objective:   Physical Exam  Nursing note and vitals reviewed. Constitutional: [name] is oriented to person, place, and time. [name] appears well-developed and well-nourished.  HENT:  Head: Normocephalic and atraumatic.  Right Ear: External ear normal.  Left Ear: External ear normal.  Nose: Nose normal.  Eyes: Conjunctivae and EOM are normal.  Neck: Normal range of motion. Neck supple. No JVD present. No thyromegaly present.  Cardiovascular: Normal rate, regular rhythm, normal heart sounds and intact distal pulses.   Pulmonary/Chest: Effort normal and breath sounds normal.  Abdominal: Soft. Bowel sounds are normal. [name] exhibits mass. [name] exhibits no distension. There is no tenderness.  Musculoskeletal: Normal range of motion. [name] exhibits no edema and no tenderness.  Lymphadenopathy:    [name] has no cervical adenopathy.  Neurological: [name] is alert and oriented to person, place, and time. [name] has normal reflexes. No cranial nerve deficit. Coordination normal.  Skin: Skin is warm and dry.  Left medial Thigh with purplish discoloration with firm middle section, no erythema  or exudate  Psychiatric: [name] has a normal mood and affect. His behavior is normal. Judgment and thought content normal.          Assessment & Plan:  1. Cholesterol- recheck labs, Need to eat healthier and exercise AD.  2. Vit D- with dose decrease, recheck labs  3. ? MRSA- Protocol given for Chlorine baths, Mupirocin nasal swab treatment and DOxy 100 mg AD. F/U with results

## 2014-02-06 NOTE — Patient Instructions (Signed)
MRSA Infection MRSA stands for methicillin-resistant Staphylococcus aureus. This type of infection is caused by Staphylococcus aureus bacteria that are no longer affected by the medicines used to kill them (drug resistant). Staphylococcus (staph) bacteria are normally found on the skin or in the nose of healthy people. In most cases, these bacteria do not cause infection. But if these resistant bacteria enter your body through a cut or sore, they can cause a serious infection on your skin or in other parts of your body. There is a slight chance that the staph on your skin or in your nose is MRSA. There are two types of MRSA infections:  Hospital-acquired MRSA is bacteria that you get in the hospital.  Community-acquired MRSA is bacteria that you get somewhere other than in a hospital. RISK FACTORS Hospital-acquired MRSA is more common. You could be at risk for this infection if you are in the hospital and you:  Have surgery or a procedure.  Have an IV access or a catheter tube placed in your body.  Have weak resistance to germs (weakened immune system).  Are elderly.  Are on kidney dialysis. You could be at risk for community-acquired MRSA if you have a break in your skin and come into contact with MRSA. This may happen if you:  Play sports where there is skin-to-skin contact.  Live in a crowded setting, like a dormitory or a military barracks.  Share towels, razors, or sports equipment with other people. SYMPTOMS  Symptoms of hospital-acquired MRSA depend on where MRSA has spread. Symptoms may include:  Wound infection.  Skin infection.  Rash.  Pneumonia.  Fever and chills.  Difficulty breathing.  Chest pain. Community-acquired MRSA is most likely to start as a scratch or cut that becomes infected. Symptoms may include:  A pus-filled pimple.  A boil on your skin.  Pus draining from your skin.  A sore (abscess) under your skin or somewhere in your body.  Fever  with or without chills. DIAGNOSIS  The diagnosis of MRSA is made by taking a sample from an infected area and sending it to a lab for testing. A lab technician can grow (culture) MRSA and check it under a microscope. The cultured MRSA can be tested to see which type of antibiotic medicine will work to treat it. Newer tests can identify MRSA more quickly by testing bacteria samples for MRSA genes. Your health care provider can diagnose MRSA using samples from:   Cuts or wounds in infected areas.  Nasal swabs.  Saliva or cough specimens from deep in the lungs (sputum).  Urine.  Blood. You may also have:  Imaging studies (such as X-ray or MRI) to check if the infection has spread to the lungs, bones, or joints.  A culture and sensitivity test of blood or fluids from inside the joints. TREATMENT  Treatment depends on how severe, deep, or extensive the infection is. Very bad infections may require a hospital stay.  Some skin infections, such as a small boil or sore (abscess), may be treated by draining pus from the site of the infection.  More extensive surgery to drain pus may be necessary for deeper or more widespread soft tissue infections.  You may then have to take antibiotic medicine given by mouth or through a vein. You may start antibiotic treatment right away or after testing can be done to see what antibiotic medicine should be used. HOME CARE INSTRUCTIONS   Take your antibiotics as directed by your health care provider. Take   the medicine as prescribed until it is finished.  Avoid close contact with those around you as much as possible. Do not use towels, razors, toothbrushes, bedding, or other items that will be used by others.  Wash your hands frequently for 15 seconds with soap and water. Dry your hands with a clean or disposable towel.  When you are not able to wash your hands, use hand sanitizer that is more than 60 percent alcohol.  Wash towels, sheets, or clothes in  the washing machine with detergent and hot water. Dry them in a hot dryer.  Follow your health care provider's instructions for wound care. Wash your hands before and after changing your bandages.  Always shower after exercising.  Keep all cuts and scrapes clean and covered with a bandage.  Be sure to tell all your health care providers that you have MRSA so they are aware of your infection. SEEK MEDICAL CARE IF:  You have a cut, scrape, pimple, or boil that becomes red, swollen, or painful or has pus in it.  You have pus draining from your skin.  You have an abscess under your skin or somewhere in your body. SEEK IMMEDIATE MEDICAL CARE IF:   You have symptoms of a skin infection with a fever or chills.  You have trouble breathing.  You have chest pain.  You have a skin wound and you become nauseous or start vomiting. MAKE SURE YOU:  Understand these instructions.  Will watch your condition.  Will get help right away if you are not doing well or get worse. Document Released: 08/04/2005 Document Revised: 08/09/2013 Document Reviewed: 05/27/2013 Exit[employer] Patient Information 2015 [employer]. This information is not intended to replace advice given to you by your health care provider. Make sure you discuss any questions you have with your health care provider.  

## 2014-02-07 LAB — VITAMIN D 25 HYDROXY (VIT D DEFICIENCY, FRACTURES): VIT D 25 HYDROXY: 107 ng/mL — AB (ref 30–89)

## 2014-02-14 ENCOUNTER — Telehealth: Payer: Self-pay

## 2014-02-14 NOTE — Telephone Encounter (Signed)
Patient called and is having side effects from Doxy. Patient complains of abdominal pain and nausea. No vomiting. Also complains of itchy eyes. Per Loree FeeMelissa Smith, PA-C, patient is advised to stop Doxy, but continue using cream. Patient aware

## 2014-02-20 ENCOUNTER — Other Ambulatory Visit: Payer: Self-pay | Admitting: Emergency Medicine

## 2014-04-12 ENCOUNTER — Telehealth: Payer: Self-pay | Admitting: *Deleted

## 2014-04-12 DIAGNOSIS — L989 Disorder of the skin and subcutaneous tissue, unspecified: Secondary | ICD-10-CM

## 2014-04-12 NOTE — Telephone Encounter (Signed)
Pt is calling says spot on leg still there continues to come back, MRSA? Asking does he need a referral to a skin DR? Or something ?

## 2014-04-12 NOTE — Telephone Encounter (Signed)
Patient aware.  Advised him if any increase in symptoms to call for OV for further evaluation.

## 2014-05-24 ENCOUNTER — Encounter: Payer: Self-pay | Admitting: Internal Medicine

## 2014-05-25 ENCOUNTER — Other Ambulatory Visit: Payer: Self-pay | Admitting: Internal Medicine

## 2014-05-26 NOTE — Telephone Encounter (Signed)
Called into pharm  

## 2014-06-22 ENCOUNTER — Ambulatory Visit (INDEPENDENT_AMBULATORY_CARE_PROVIDER_SITE_OTHER): Payer: BC Managed Care – PPO | Admitting: Internal Medicine

## 2014-06-22 ENCOUNTER — Encounter: Payer: Self-pay | Admitting: Internal Medicine

## 2014-06-22 VITALS — BP 110/78 | HR 68 | Temp 98.1°F | Resp 16 | Ht 69.0 in | Wt 238.8 lb

## 2014-06-22 DIAGNOSIS — R6889 Other general symptoms and signs: Secondary | ICD-10-CM

## 2014-06-22 DIAGNOSIS — Z111 Encounter for screening for respiratory tuberculosis: Secondary | ICD-10-CM

## 2014-06-22 DIAGNOSIS — K2100 Gastro-esophageal reflux disease with esophagitis, without bleeding: Secondary | ICD-10-CM

## 2014-06-22 DIAGNOSIS — R7309 Other abnormal glucose: Secondary | ICD-10-CM

## 2014-06-22 DIAGNOSIS — Z125 Encounter for screening for malignant neoplasm of prostate: Secondary | ICD-10-CM

## 2014-06-22 DIAGNOSIS — I10 Essential (primary) hypertension: Secondary | ICD-10-CM

## 2014-06-22 DIAGNOSIS — Z0001 Encounter for general adult medical examination with abnormal findings: Secondary | ICD-10-CM

## 2014-06-22 DIAGNOSIS — Z79899 Other long term (current) drug therapy: Secondary | ICD-10-CM

## 2014-06-22 DIAGNOSIS — E785 Hyperlipidemia, unspecified: Secondary | ICD-10-CM

## 2014-06-22 DIAGNOSIS — Z1212 Encounter for screening for malignant neoplasm of rectum: Secondary | ICD-10-CM

## 2014-06-22 DIAGNOSIS — E559 Vitamin D deficiency, unspecified: Secondary | ICD-10-CM

## 2014-06-22 DIAGNOSIS — R7303 Prediabetes: Secondary | ICD-10-CM

## 2014-06-22 DIAGNOSIS — Z23 Encounter for immunization: Secondary | ICD-10-CM

## 2014-06-22 LAB — CBC WITH DIFFERENTIAL/PLATELET
BASOS ABS: 0.1 10*3/uL (ref 0.0–0.1)
BASOS PCT: 1 % (ref 0–1)
EOS ABS: 0.2 10*3/uL (ref 0.0–0.7)
Eosinophils Relative: 3 % (ref 0–5)
HCT: 45 % (ref 39.0–52.0)
Hemoglobin: 15.6 g/dL (ref 13.0–17.0)
Lymphocytes Relative: 36 % (ref 12–46)
Lymphs Abs: 2.9 10*3/uL (ref 0.7–4.0)
MCH: 30 pg (ref 26.0–34.0)
MCHC: 34.7 g/dL (ref 30.0–36.0)
MCV: 86.5 fL (ref 78.0–100.0)
Monocytes Absolute: 0.6 10*3/uL (ref 0.1–1.0)
Monocytes Relative: 7 % (ref 3–12)
NEUTROS ABS: 4.3 10*3/uL (ref 1.7–7.7)
NEUTROS PCT: 53 % (ref 43–77)
Platelets: 136 10*3/uL — ABNORMAL LOW (ref 150–400)
RBC: 5.2 MIL/uL (ref 4.22–5.81)
RDW: 13.1 % (ref 11.5–15.5)
WBC: 8.1 10*3/uL (ref 4.0–10.5)

## 2014-06-22 LAB — HEMOGLOBIN A1C
HEMOGLOBIN A1C: 5.6 % (ref <5.7)
Mean Plasma Glucose: 114 mg/dL (ref <117)

## 2014-06-22 NOTE — Patient Instructions (Signed)

## 2014-06-22 NOTE — Progress Notes (Signed)
Patient ID: [name], male   DOB: 10/19/1960, 53 y.o.   MRN: [id number]  Annual Screening Comprehensive Examination  This very nice 53 y.o.SWM presents for complete physical.  Patient has been followed for labile HTN, Prediabetes, Hyperlipidemia, GERD and Vitamin D Deficiency. His GERD sx's have been well controlled with his omeprazole. Also patient has hx/o HNP and has occasional flares of LBP & sciatica.    Patient has a several year hx/o labile HTN and is being monitored expectantly. Patient's BP has been controlled and today's BP: 110/78 mmHg. Patient denies any cardiac symptoms as chest pain, palpitations, shortness of breath, dizziness or ankle swelling.   Patient's hyperlipidemia is controlled with diet. Last lipids were Total Chol 170; HDL  29*; LDL 104; Trig 183 on 02/06/2014.   Patient has  prediabetes since   June 2012 with A1c 5.9%.  Patient denies reactive hypoglycemic symptoms, visual blurring, diabetic polys or paresthesias. Last A1c was  5.5% on 11/03/2013.   Finally, patient has history of Vitamin D Deficiency of 20 in 2011 and last vitamin D was 107 on 02/06/2014 and dose was decreased.  Medication Sig  . ALPRAZolam  1 MG tablet TAKE 1/2-1 TAB 3 TIMES DAILY  . VITAMIN D  Take 2,000 Int'l Units  daily.   [name]. NORCO 5-325 Take 1 tabletevery 6  hours as needed   . omeprazole  40 MG capsule TAKE 1 CAPSULE BY MOUTH DAILY   Allergies  Allergen Reactions  . Niacin And Related Swelling    Red rash and swelling  . Gemfibrozil   . Lipitor [Atorvastatin]   . Penicillins Other (See Comments)    Unknown childhood allergy   Past Medical History  Diagnosis Date  . Hypercholesteremia   . Morbid obesity   . Bruise 08/08/11    pt states brusing x 1 month  . Tick bites 08/08/11    60 to 70 ticks in last hunting season , last > 1 month  . 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 Chest pain 08/08/2011    "a little in  last month"  . Leg cramps 08/08/2011    bad in last 1.5 months  . Sleep apnea   . Acid reflux   . Arthritis   . Prediabetes   . Idiopathic thrombocytopenic purpura (ITP)   . Hypertension, Labile 11/03/2013   Health Maintenance  Topic Date Due  . INFLUENZA VACCINE  03/18/2014  . TETANUS/TDAP  08/18/2018  . COLONOSCOPY  06/02/2022   Immunization History  Administered Date(s) Administered  . Influenza Split 05/24/2013, 06/22/2014  . PPD Test 06/22/2014  . Pneumococcal Polysaccharide-23 05/19/2012  . Td 08/18/2008   Past Surgical History  Procedure Laterality Date  . Back surgery  07/2010    lumbar - 3 discs  . Fracture surgery    . Lumbar disc surgery  2011   Family History  Problem Relation Age of Onset  . Uterine cancer Mother   . Uterine cancer Sister     History   Social History  . Marital Status: Single    Spouse Name: N/A    Number of Children: N/A  . Years of Education: N/A   Occupational History  . [employer]   Social History Main Topics  . Smoking status: Former Smoker -- 0.50 packs/day for 2 years  . Smokeless tobacco: Current User    Types: Chew     Comment:  smokes 1-2 cigarettes daily  . Alcohol Use: No     Comment: drank 12 beers last night on 08/07/11, pt states rarely drinks  . Drug Use: No     Comment: occasionally chews tobacco  . Sexual Activity: Yes    ROS Constitutional: Denies fever, chills, weight loss/gain, headaches, insomnia, fatigue, night sweats or change in appetite. Eyes: Denies redness, blurred vision, diplopia, discharge, itchy or watery eyes.  ENT: Denies discharge, congestion, post nasal drip, epistaxis, sore throat, earache, hearing loss, dental pain, Tinnitus, Vertigo, Sinus pain or snoring.  Cardio: Denies chest pain, palpitations, irregular heartbeat, syncope, dyspnea, diaphoresis, orthopnea, PND, claudication or edema Respiratory: denies cough, dyspnea, DOE, pleurisy, hoarseness, laryngitis or wheezing.   Gastrointestinal: Denies dysphagia, heartburn, reflux, water brash, pain, cramps, nausea, vomiting, bloating, diarrhea, constipation, hematemesis, melena, hematochezia, jaundice or hemorrhoids Genitourinary: Denies dysuria, frequency, urgency, nocturia, hesitancy, discharge, hematuria or flank pain Musculoskeletal: Denies arthralgia, myalgia, stiffness, Jt. Swelling, pain, limp or strain/sprain. Denies Falls. Skin: Denies puritis, rash, hives, warts, acne, eczema or change in skin lesion Neuro: No weakness, tremor, incoordination, spasms, paresthesia or pain Psychiatric: Denies confusion, memory loss or sensory loss. Denies Depression. Endocrine: Denies change in weight, skin, hair change, nocturia, and paresthesia, diabetic polys, visual blurring or hyper / hypo glycemic episodes.  Heme/Lymph: No excessive bleeding, bruising or enlarged lymph nodes.  Physical Exam  BP 110/78   Pulse 68  Temp 98.1 F   Resp 16  Ht 5\' 9"    Wt 238 lb 12.8 oz   BMI 35.25   General Appearance: Well nourished, in no apparent distress. Eyes: PERRLA, EOMs, conjunctiva no swelling or erythema, normal fundi and vessels. Sinuses: No frontal/maxillary tenderness ENT/Mouth: EACs patent / TMs  nl. Nares clear without erythema, swelling, mucoid exudates. Oral hygiene is good. No erythema, swelling, or exudate. Tongue normal, non-obstructing. Tonsils not swollen or erythematous. Hearing normal.  Neck: Supple, thyroid normal. No bruits, nodes or JVD. Respiratory: Respiratory effort normal.  BS equal and clear bilateral without rales, rhonci, wheezing or stridor. Cardio: Heart sounds are normal with regular rate and rhythm and no murmurs, rubs or gallops. Peripheral pulses are normal and equal bilaterally without edema. No aortic or femoral bruits. Chest: symmetric with normal excursions and percussion.  Abdomen: Flat, soft, with bowl sounds. Nontender, no guarding, rebound, hernias, masses, or organomegaly.   Lymphatics: Non tender without lymphadenopathy.  Genitourinary: No hernias.Testes nl. DRE - prostate nl for age - smooth & firm w/o nodules. Musculoskeletal: Full ROM all peripheral extremities, joint stability, 5/5 strength, and normal gait. Skin: Warm and dry without rashes, lesions, cyanosis, clubbing or  ecchymosis.  Neuro: Cranial nerves intact, reflexes equal bilaterally. Normal muscle tone, no cerebellar symptoms. Sensation intact.  Pysch: Awake and oriented X 3with normal affect, insight and judgment appropriate.   Assessment and Plan  1. Annual Screening Examination 2. Hypertension,Labile   3. Hyperlipidemia 4. Pre Diabetes 5. Vitamin D Deficiency 6. Hx/o ITP ( 07/2011) in Remission   Continue prudent diet as discussed, weight control, BP monitoring, regular exercise, and medications as discussed.  Discussed med effects and SE's. Routine screening labs and tests as requested with regular follow-up as recommended.

## 2014-06-23 LAB — URINALYSIS, MICROSCOPIC ONLY
BACTERIA UA: NONE SEEN
CASTS: NONE SEEN
CRYSTALS: NONE SEEN
Squamous Epithelial / LPF: NONE SEEN

## 2014-06-23 LAB — HEPATIC FUNCTION PANEL
ALT: 14 U/L (ref 0–53)
AST: 15 U/L (ref 0–37)
Albumin: 4.3 g/dL (ref 3.5–5.2)
Alkaline Phosphatase: 104 U/L (ref 39–117)
BILIRUBIN TOTAL: 0.6 mg/dL (ref 0.2–1.2)
Bilirubin, Direct: 0.1 mg/dL (ref 0.0–0.3)
Indirect Bilirubin: 0.5 mg/dL (ref 0.2–1.2)
Total Protein: 7.2 g/dL (ref 6.0–8.3)

## 2014-06-23 LAB — VITAMIN B12: Vitamin B-12: 477 pg/mL (ref 211–911)

## 2014-06-23 LAB — MICROALBUMIN / CREATININE URINE RATIO
Creatinine, Urine: 58.2 mg/dL
MICROALB UR: 0.2 mg/dL (ref <2.0)
MICROALB/CREAT RATIO: 3.4 mg/g (ref 0.0–30.0)

## 2014-06-23 LAB — BASIC METABOLIC PANEL WITH GFR
BUN: 9 mg/dL (ref 6–23)
CALCIUM: 9.1 mg/dL (ref 8.4–10.5)
CO2: 24 mEq/L (ref 19–32)
Chloride: 103 mEq/L (ref 96–112)
Creat: 0.93 mg/dL (ref 0.50–1.35)
GFR, Est African American: >89 mL/min
GFR, Est Non African American: >89 mL/min
GLUCOSE: 86 mg/dL (ref 70–99)
Potassium: 4 mEq/L (ref 3.5–5.3)
SODIUM: 135 meq/L (ref 135–145)

## 2014-06-23 LAB — MAGNESIUM: MAGNESIUM: 2.1 mg/dL (ref 1.5–2.5)

## 2014-06-23 LAB — LIPID PANEL
CHOLESTEROL: 179 mg/dL (ref 0–200)
HDL: 25 mg/dL — ABNORMAL LOW (ref >39)
LDL Cholesterol: 96 mg/dL (ref 0–99)
Total CHOL/HDL Ratio: 7.2 Ratio
Triglycerides: 289 mg/dL — ABNORMAL HIGH (ref <150)
VLDL: 58 mg/dL — ABNORMAL HIGH (ref 0–40)

## 2014-06-23 LAB — PSA: PSA: 0.24 ng/mL (ref <=4.00)

## 2014-06-23 LAB — INSULIN, FASTING: Insulin fasting, serum: 5.1 u[IU]/mL (ref 2.0–19.6)

## 2014-06-23 LAB — VITAMIN D 25 HYDROXY (VIT D DEFICIENCY, FRACTURES): VIT D 25 HYDROXY: 58 ng/mL (ref 30–89)

## 2014-06-23 LAB — TSH: TSH: 2.476 u[IU]/mL (ref 0.350–4.500)

## 2014-06-23 LAB — TESTOSTERONE: TESTOSTERONE: 526 ng/dL (ref 300–890)

## 2014-06-25 ENCOUNTER — Other Ambulatory Visit: Payer: Self-pay | Admitting: Physician Assistant

## 2014-06-25 NOTE — Addendum Note (Signed)
Addended by: Isidro Monks A on: 06/25/2014 08:49 AM   Modules accepted: Orders

## 2014-06-26 LAB — TB SKIN TEST
INDURATION: 0 mm
TB SKIN TEST: NEGATIVE

## 2014-07-05 ENCOUNTER — Other Ambulatory Visit (INDEPENDENT_AMBULATORY_CARE_PROVIDER_SITE_OTHER): Payer: BC Managed Care – PPO | Admitting: *Deleted

## 2014-07-05 DIAGNOSIS — Z1212 Encounter for screening for malignant neoplasm of rectum: Secondary | ICD-10-CM

## 2014-07-05 LAB — POC HEMOCCULT BLD/STL (HOME/3-CARD/SCREEN)
Card #2 Fecal Occult Blod, POC: NEGATIVE
FECAL OCCULT BLD: NEGATIVE
FECAL OCCULT BLD: NEGATIVE

## 2014-07-12 ENCOUNTER — Encounter: Payer: Self-pay | Admitting: Internal Medicine

## 2014-07-27 ENCOUNTER — Other Ambulatory Visit: Payer: Self-pay | Admitting: Physician Assistant

## 2014-08-09 ENCOUNTER — Other Ambulatory Visit: Payer: Self-pay | Admitting: Gastroenterology

## 2014-10-03 ENCOUNTER — Ambulatory Visit (INDEPENDENT_AMBULATORY_CARE_PROVIDER_SITE_OTHER): Payer: 59 | Admitting: Physician Assistant

## 2014-10-03 ENCOUNTER — Encounter: Payer: Self-pay | Admitting: Physician Assistant

## 2014-10-03 VITALS — BP 130/82 | HR 76 | Temp 97.7°F | Resp 16 | Ht 70.0 in | Wt 240.0 lb

## 2014-10-03 DIAGNOSIS — I10 Essential (primary) hypertension: Secondary | ICD-10-CM

## 2014-10-03 DIAGNOSIS — E559 Vitamin D deficiency, unspecified: Secondary | ICD-10-CM

## 2014-10-03 DIAGNOSIS — R7309 Other abnormal glucose: Secondary | ICD-10-CM

## 2014-10-03 DIAGNOSIS — E785 Hyperlipidemia, unspecified: Secondary | ICD-10-CM

## 2014-10-03 DIAGNOSIS — Z79899 Other long term (current) drug therapy: Secondary | ICD-10-CM

## 2014-10-03 DIAGNOSIS — G4730 Sleep apnea, unspecified: Secondary | ICD-10-CM

## 2014-10-03 DIAGNOSIS — R7303 Prediabetes: Secondary | ICD-10-CM

## 2014-10-03 MED ORDER — HYDROCODONE-ACETAMINOPHEN 5-325 MG PO TABS: 1.0000 | ORAL_TABLET | Freq: Four times a day (QID) | ORAL | Status: DC | PRN

## 2014-10-03 NOTE — Progress Notes (Signed)
. Assessment and Plan:  Hypertension: Continue medication, monitor blood pressure at home. Continue DASH diet.  Reminder to go to the ER if any CP, SOB, nausea, dizziness, severe HA, changes vision/speech, left arm numbness and tingling, and jaw pain. Cholesterol: Continue diet and exercise. Check cholesterol.  Pre-diabetes-Continue diet and exercise. Check A1C Vitamin D Def- check level and continue medications.  Obesity with co morbidities- long discussion about weight loss, diet, and exercise Insomnia- ? Sleep apnea/RLS- has had history of apnea in home.- at homes sleep study Sty- warm wet compresses Onychomycosis- do every other month, check LFTs.   Continue diet and meds as discussed. Further disposition pending results of labs.  HPI 54 y.o. male  presents for 3 month follow up with hypertension, hyperlipidemia, prediabetes and vitamin D.  His blood pressure has been controlled at home, today their BP is BP: 130/82 mmHg  He does not workout. He denies chest pain, shortness of breath, dizziness.  He is on cholesterol medication and denies myalgias. His cholesterol is not at goal. The cholesterol last visit was:   Lab Results  Component Value Date   CHOL 179 06/22/2014   HDL 25* 06/22/2014   LDLCALC 96 06/22/2014   TRIG 289* 06/22/2014   CHOLHDL 7.2 06/22/2014   He has been working on diet and exercise for prediabetes however with the holidays he has gained 15 lbs since June, and denies paresthesia of the feet, polydipsia, polyuria and visual disturbances. Last A1C in the office was:  Lab Results  Component Value Date   HGBA1C 5.6 06/22/2014  Patient is on Vitamin D supplement.   Lab Results  Component Value Date   VD25OH 58 06/22/2014    BMI is Body mass index is 34.44 kg/(m^2)., he is working on diet and exercise. He has trouble sleeping, will only sleep 3-4 hours and then will wake up.  Wt Readings from Last 3 Encounters:  10/03/14 240 lb (108.863 kg)  06/22/14 238 lb 12.8  oz (108.319 kg)  02/06/14 225 lb (102.059 kg)  He complains of left eye pain for the past 3 weeks. Feels like a piece of sand in his eye, has painful bump on top medial left eye, no treatment, does not wear contacts. No blurred vision, no discharge other than thin watery discharge.  He has been on lamisil for onychomycosis of toenails for 3 months.    Current Medications:  Current Outpatient Prescriptions on File Prior to Visit  Medication Sig Dispense Refill  . ALPRAZolam (XANAX) 1 MG tablet TAKE ONE-HALF OR ONE TABLET BY MOUTH UP TO THREE TIMES A DAY AS NEEDED FOR ANXIETY 90 tablet 3  . Cholecalciferol (VITAMIN D PO) Take 2,000 Int'l Units by mouth daily.     Marland Kitchen. econazole nitrate 1 % cream   3  . Flaxseed, Linseed, (FLAXSEED OIL) 1200 MG CAPS Take by mouth daily.    Marland Kitchen. HYDROcodone-acetaminophen (NORCO/VICODIN) 5-325 MG per tablet Take 1 tablet by mouth every 6 (six) hours as needed for moderate pain. 100 tablet 0  . Omega-3 Fatty Acids (FISH OIL) 1200 MG CAPS Take by mouth daily.    Marland Kitchen. omeprazole (PRILOSEC) 40 MG capsule TAKE 1 CAPSULE BY MOUTH DAILY 90 capsule 3   No current facility-administered medications on file prior to visit.   Medical History:  Past Medical History  Diagnosis Date  . Hypercholesteremia   . Morbid obesity   . Bruise 08/08/11    pt states brusing x 1 month  . Tick bites 08/08/11  60 to 70 ticks in last hunting season , last > 1 month  . 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 Chest pain 08/08/2011    "a little in last month"  . Leg cramps 08/08/2011    bad in last 1.5 months  . Sleep apnea   . Acid reflux   . Arthritis   . Prediabetes   . Idiopathic thrombocytopenic purpura (ITP)   . Hypertension, Labile 11/03/2013   Allergies:  Allergies  Allergen Reactions  . Niacin And Related Swelling    Red rash and swelling  . Gemfibrozil   . Lipitor [Atorvastatin]   . Penicillins Other (See  Comments)    Unknown childhood allergy     Review of Systems:  Review of Systems  Constitutional: Negative.   HENT: Negative.   Eyes: Positive for blurred vision and pain. Negative for double vision, photophobia, discharge and redness.  Respiratory: Negative.   Cardiovascular: Negative.   Gastrointestinal: Negative.   Genitourinary: Negative.   Musculoskeletal: Positive for myalgias and joint pain (bilateral ankles). Negative for back pain, falls and neck pain.  Skin: Negative.   Neurological: Negative.   Psychiatric/Behavioral: Negative.     Family history- Review and unchanged Social history- Review and unchanged Physical Exam: BP 130/82 mmHg  Pulse 76  Temp(Src) 97.7 F (36.5 C)  Resp 16  Ht  (1.778 m)  Wt 240 lb (108.863 kg)  BMI 34.44 kg/m2 Wt Readings from Last 3 Encounters:  10/03/14 240 lb (108.863 kg)  06/22/14 238 lb 12.8 oz (108.319 kg)  02/06/14 225 lb (102.059 kg)   General Appearance: Well nourished, in no apparent distress. Eyes: PERRLA, EOMs, conjunctiva no swelling or erythema, + white nodule on left medial upper lid without erythema, swelling.  Sinuses: No Frontal/maxillary tenderness ENT/Mouth: Ext aud canals clear, TMs without erythema, bulging. No erythema, swelling, or exudate on post pharynx.  Tonsils not swollen or erythematous. Hearing normal.  Neck: Supple, thyroid normal.  Respiratory: Respiratory effort normal, BS equal bilaterally without rales, rhonchi, wheezing or stridor.  Cardio: RRR with no MRGs. Brisk peripheral pulses without edema.  Abdomen: Soft, + BS,  Non tender, no guarding, rebound, hernias, masses. Lymphatics: Non tender without lymphadenopathy.  Musculoskeletal: Full ROM, 5/5 strength, Normal gait Skin: Warm, dry without rashes, lesions, ecchymosis.  Neuro: Cranial nerves intact. Normal muscle tone, no cerebellar symptoms. Psych: Awake and oriented X 3, normal affect, Insight and Judgment appropriate.    Quentin Mulling, PA-C 3:45 PM [facility]

## 2014-10-03 NOTE — Patient Instructions (Signed)
Sty A sty (hordeolum) is an infection of a gland in the eyelid located at the base of the eyelash. A sty may develop a white or yellow head of pus. It can be puffy (swollen). Usually, the sty will burst and pus will come out on its own. They do not leave lumps in the eyelid once they drain. A sty is often confused with another form of cyst of the eyelid called a chalazion. Chalazions occur within the eyelid and not on the edge where the bases of the eyelashes are. They often are red, sore and then form firm lumps in the eyelid. CAUSES   Germs (bacteria).  Lasting (chronic) eyelid inflammation. SYMPTOMS   Tenderness, redness and swelling along the edge of the eyelid at the base of the eyelashes.  Sometimes, there is a white or yellow head of pus. It may or may not drain. DIAGNOSIS  An ophthalmologist will be able to distinguish between a sty and a chalazion and treat the condition appropriately.  TREATMENT   Styes are typically treated with warm packs (compresses) until drainage occurs.  In rare cases, medicines that kill germs (antibiotics) may be prescribed. These antibiotics may be in the form of drops, cream or pills.  If a hard lump has formed, it is generally necessary to do a small incision and remove the hardened contents of the cyst in a minor surgical procedure done in the office.  In suspicious cases, your caregiver may send the contents of the cyst to the lab to be certain that it is not a rare, but dangerous form of cancer of the glands of the eyelid. HOME CARE INSTRUCTIONS   Wash your hands often and dry them with a clean towel. Avoid touching your eyelid. This may spread the infection to other parts of the eye.  Apply heat to your eyelid for 10 to 20 minutes, several times a day, to ease pain and help to heal it faster.  Do not squeeze the sty. Allow it to drain on its own. Wash your eyelid carefully 3 to 4 times per day to remove any pus. SEEK IMMEDIATE MEDICAL CARE IF:    Your eye becomes painful or puffy (swollen).  Your vision changes.  Your sty does not drain by itself within 3 days.  Your sty comes back within a short period of time, even with treatment.  You have redness (inflammation) around the eye.  You have a fever. Document Released: 05/14/2005 Document Revised: 10/27/2011 Document Reviewed: 11/18/2013 [facility] Patient Information 2015 [place], [place]. This information is not intended to replace advice given to you by your health care provider. Make sure you discuss any questions you have with your health care provider.  Before you even begin to attack a weight-loss plan, it pays to remember this: You are not fat. You have fat. Losing weight isn't about blame or shame; it's simply another achievement to accomplish. Dieting is like any other skill-you have to buckle down and work at it. As long as you act in a smart, reasonable way, you'll ultimately get where you want to be. Here are some weight loss pearls for you.  1. It's Not a Diet. It's a Lifestyle Thinking of a diet as something you're on and suffering through only for the short term doesn't work. To shed weight and keep it off, you need to make permanent changes to the way you eat. It's OK to indulge occasionally, of course, but if you cut calories temporarily and then revert to  your old way of eating, you'll gain back the weight quicker than you can say yo-yo. Use it to lose it. Research shows that one of the best predictors of long-term weight loss is how many pounds you drop in the first month. For that reason, nutritionists often suggest being stricter for the first two weeks of your new eating strategy to build momentum. Cut out added sugar and alcohol and avoid unrefined carbs. After that, figure out how you can reincorporate them in a way that's healthy and maintainable.  2. There's a Right Way to Exercise Working out burns calories and fat and boosts your metabolism by building muscle.  But those trying to lose weight are notorious for overestimating the number of calories they burn and underestimating the amount they take in. Unfortunately, your system is biologically programmed to hold on to extra pounds and that means when you start exercising, your body senses the deficit and ramps up its hunger signals. If you're not diligent, you'll eat everything you burn and then some. Use it to lose it. Cardio gets all the exercise glory, but strength and interval training are the real heroes. They help you build lean muscle, which in turn increases your metabolism and calorie-burning ability 3. Don't Overreact to Mild Hunger Some people have a hard time losing weight because of hunger anxiety. To them, being hungry is bad-something to be avoided at all costs-so they carry snacks with them and eat when they don't need to. Others eat because they're stressed out or bored. While you never want to get to the point of being ravenous (that's when bingeing is likely to happen), a hunger pang, a craving, or the fact that it's 3:00 p.m. should not send you racing for the vending machine or obsessing about the energy bar in your purse. Ideally, you should put off eating until your stomach is growling and it's difficult to concentrate.  Use it to lose it. When you feel the urge to eat, use the HALT method. Ask yourself, Am I really hungry? Or am I angry or anxious, lonely or bored, or tired? If you're still not certain, try the apple test. If you're truly hungry, an apple should seem delicious; if it doesn't, something else is going on. Or you can try drinking water and making yourself busy, if you are still hungry try a healthy snack.  4. Not All Calories Are Created Equal The mechanics of weight loss are pretty simple: Take in fewer calories than you use for energy. But the kind of food you eat makes all the difference. Processed food that's high in saturated fat and refined starch or sugar can cause  inflammation that disrupts the hormone signals that tell your brain you're full. The result: You eat a lot more.  Use it to lose it. Clean up your diet. Swap in whole, unprocessed foods, including vegetables, lean protein, and healthy fats that will fill you up and give you the biggest nutritional bang for your calorie buck. In a few weeks, as your brain starts receiving regular hunger and fullness signals once again, you'll notice that you feel less hungry overall and naturally start cutting back on the amount you eat.  5. Protein, Produce, and Plant-Based Fats Are Your Weight-Loss Trinity Here's why eating the three Ps regularly will help you drop pounds. Protein fills you up. You need it to build lean muscle, which keeps your metabolism humming so that you can torch more fat. People in a weight-loss program who  ate double the recommended daily allowance for protein (about 110 grams for a 150-pound woman) lost 70 percent of their weight from fat, while people who ate the RDA lost only about 40 percent, one study found. Produce is packed with filling fiber. "It's very difficult to consume too many calories if you're eating a lot of vegetables. Example: Three cups of broccoli is a lot of food, yet only 93 calories. (Fruit is another story. It can be easy to overeat and can contain a lot of calories from sugar, so be sure to monitor your intake.) Plant-based fats like olive oil and those in avocados and nuts are healthy and extra satiating.  Use it to lose it. Aim to incorporate each of the three Ps into every meal and snack. People who eat protein throughout the day are able to keep weight off, according to a study in the American Journal of Clinical Nutrition. In addition to meat, poultry and seafood, good sources are beans, lentils, eggs, tofu, and yogurt. As for fat, keep portion sizes in check by measuring out salad dressing, oil, and nut butters (shoot for one to two tablespoons). Finally, eat veggies  or a little fruit at every meal. People who did that consumed 308 fewer calories but didn't feel any hungrier than when they didn't eat more produce.  7. How You Eat Is As Important As What You Eat In order for your brain to register that you're full, you need to focus on what you're eating. Sit down whenever you eat, preferably at a table. Turn off the TV or computer, put down your phone, and look at your food. Smell it. Chew slowly, and don't put another bite on your fork until you swallow. When women ate lunch this attentively, they consumed 30 percent less when snacking later than those who listened to an audiobook at lunchtime, according to a study in the [place] Journal of Nutrition. 8. Weighing Yourself Really Works The scale provides the best evidence about whether your efforts are paying off. Seeing the numbers tick up or down or stagnate is motivation to keep going-or to rethink your approach. [facility] 2015 [facility] found that daily weigh-ins helped people lose more weight, keep it off, and maintain that loss, even after two years. Use it to lose it. Step on the scale at the same time every day for the best results. If your weight shoots up several pounds from one weigh-in to the next, don't freak out. Eating a lot of salt the night before or having your period is the likely culprit. The number should return to normal in a day or two. It's a steady climb that you need to do something about. 9. Too Much Stress and Too Little Sleep Are Your Enemies When you're tired and frazzled, your body cranks up the production of cortisol, the stress hormone that can cause carb cravings. Not getting enough sleep also boosts your levels of ghrelin, a hormone associated with hunger, while suppressing leptin, a hormone that signals fullness and satiety. People on a diet who slept only five and a half hours a night for two weeks lost 55 percent less fat and were hungrier than those who slept eight and a  half hours, according to a study in the [place] [employer] Journal. Use it to lose it. Prioritize sleep, aiming for seven hours or more a night, which research shows helps lower stress. And make sure you're getting quality zzz's. If a snoring spouse or a  fidgety cat wakes you up frequently throughout the night, you may end up getting the equivalent of just four hours of sleep, according to a study from [facility]. Keep pets out of the bedroom, and use a white-noise app to drown out snoring. 10. You Will Hit a plateau-And You Can Bust Through It As you slim down, your body releases much less leptin, the fullness hormone.  If you're not strength training, start right now. Building muscle can raise your metabolism to help you overcome a plateau. To keep your body challenged and burning calories, incorporate new moves and more intense intervals into your workouts or add another sweat session to your weekly routine. Alternatively, cut an extra 100 calories or so a day from your diet. Now that you've lost weight, your body simply doesn't need as much fuel.

## 2014-10-04 LAB — CBC WITH DIFFERENTIAL/PLATELET
BASOS ABS: 0 10*3/uL (ref 0.0–0.1)
BASOS PCT: 0 % (ref 0–1)
Eosinophils Absolute: 0.3 10*3/uL (ref 0.0–0.7)
Eosinophils Relative: 3 % (ref 0–5)
HCT: 43.9 % (ref 39.0–52.0)
Hemoglobin: 15 g/dL (ref 13.0–17.0)
Lymphocytes Relative: 26 % (ref 12–46)
Lymphs Abs: 2.5 10*3/uL (ref 0.7–4.0)
MCH: 29.3 pg (ref 26.0–34.0)
MCHC: 34.2 g/dL (ref 30.0–36.0)
MCV: 85.7 fL (ref 78.0–100.0)
MONO ABS: 0.7 10*3/uL (ref 0.1–1.0)
MPV: 10.7 fL (ref 8.6–12.4)
Monocytes Relative: 7 % (ref 3–12)
NEUTROS PCT: 64 % (ref 43–77)
Neutro Abs: 6.1 10*3/uL (ref 1.7–7.7)
Platelets: 115 10*3/uL — ABNORMAL LOW (ref 150–400)
RBC: 5.12 MIL/uL (ref 4.22–5.81)
RDW: 13.9 % (ref 11.5–15.5)
WBC: 9.6 10*3/uL (ref 4.0–10.5)

## 2014-10-04 LAB — BASIC METABOLIC PANEL WITH GFR
BUN: 13 mg/dL (ref 6–23)
CHLORIDE: 103 meq/L (ref 96–112)
CO2: 24 mEq/L (ref 19–32)
CREATININE: 0.99 mg/dL (ref 0.50–1.35)
Calcium: 8.9 mg/dL (ref 8.4–10.5)
GFR, Est Non African American: 86 mL/min
GLUCOSE: 83 mg/dL (ref 70–99)
Potassium: 4.1 mEq/L (ref 3.5–5.3)
Sodium: 138 mEq/L (ref 135–145)

## 2014-10-04 LAB — HEPATIC FUNCTION PANEL
ALBUMIN: 4.3 g/dL (ref 3.5–5.2)
ALT: 17 U/L (ref 0–53)
AST: 17 U/L (ref 0–37)
Alkaline Phosphatase: 82 U/L (ref 39–117)
BILIRUBIN INDIRECT: 0.3 mg/dL (ref 0.2–1.2)
BILIRUBIN TOTAL: 0.4 mg/dL (ref 0.2–1.2)
Bilirubin, Direct: 0.1 mg/dL (ref 0.0–0.3)
TOTAL PROTEIN: 7 g/dL (ref 6.0–8.3)

## 2014-10-04 LAB — LIPID PANEL
CHOL/HDL RATIO: 8 ratio
Cholesterol: 199 mg/dL (ref 0–200)
HDL: 25 mg/dL — ABNORMAL LOW (ref >39)
LDL CALC: 114 mg/dL — AB (ref 0–99)
TRIGLYCERIDES: 298 mg/dL — AB (ref <150)
VLDL: 60 mg/dL — ABNORMAL HIGH (ref 0–40)

## 2014-10-04 LAB — HEMOGLOBIN A1C
Hgb A1c MFr Bld: 5.6 % (ref <5.7)
MEAN PLASMA GLUCOSE: 114 mg/dL (ref <117)

## 2014-10-04 LAB — MAGNESIUM: MAGNESIUM: 1.9 mg/dL (ref 1.5–2.5)

## 2014-10-04 LAB — TSH: TSH: 1.491 u[IU]/mL (ref 0.350–4.500)

## 2014-10-04 LAB — INSULIN, FASTING: Insulin fasting, serum: 10.6 u[IU]/mL (ref 2.0–19.6)

## 2014-10-04 LAB — VITAMIN D 25 HYDROXY (VIT D DEFICIENCY, FRACTURES): Vit D, 25-Hydroxy: 36 ng/mL (ref 30–100)

## 2014-10-07 ENCOUNTER — Encounter: Payer: Self-pay | Admitting: *Deleted

## 2014-11-09 ENCOUNTER — Other Ambulatory Visit: Payer: Self-pay | Admitting: Physician Assistant

## 2014-11-16 ENCOUNTER — Telehealth: Payer: Self-pay | Admitting: Physician Assistant

## 2014-11-16 NOTE — Telephone Encounter (Signed)
Called patient with Findings from Sleep Study, and recommendation for CPAP Equipment, patient would like for [facility] to call him to order & recieve equipment price. [facility]ed [facility] to [facility].  Thank you, Katrina Webb [facility] [phone] Fax [phone]

## 2014-12-13 ENCOUNTER — Other Ambulatory Visit: Payer: Self-pay | Admitting: Internal Medicine

## 2015-01-09 ENCOUNTER — Ambulatory Visit (INDEPENDENT_AMBULATORY_CARE_PROVIDER_SITE_OTHER): Payer: 59 | Admitting: Internal Medicine

## 2015-01-09 ENCOUNTER — Encounter: Payer: Self-pay | Admitting: Internal Medicine

## 2015-01-09 VITALS — BP 120/74 | HR 72 | Temp 97.8°F | Resp 16 | Ht 70.0 in | Wt 233.8 lb

## 2015-01-09 DIAGNOSIS — R7309 Other abnormal glucose: Secondary | ICD-10-CM

## 2015-01-09 DIAGNOSIS — K2100 Gastro-esophageal reflux disease with esophagitis, without bleeding: Secondary | ICD-10-CM

## 2015-01-09 DIAGNOSIS — M25571 Pain in right ankle and joints of right foot: Secondary | ICD-10-CM

## 2015-01-09 DIAGNOSIS — R7303 Prediabetes: Secondary | ICD-10-CM

## 2015-01-09 DIAGNOSIS — I10 Essential (primary) hypertension: Secondary | ICD-10-CM

## 2015-01-09 DIAGNOSIS — Z79899 Other long term (current) drug therapy: Secondary | ICD-10-CM

## 2015-01-09 DIAGNOSIS — E785 Hyperlipidemia, unspecified: Secondary | ICD-10-CM

## 2015-01-09 DIAGNOSIS — E559 Vitamin D deficiency, unspecified: Secondary | ICD-10-CM

## 2015-01-09 LAB — CBC WITH DIFFERENTIAL/PLATELET
BASOS ABS: 0.1 10*3/uL (ref 0.0–0.1)
BASOS PCT: 1 % (ref 0–1)
Eosinophils Absolute: 0.3 10*3/uL (ref 0.0–0.7)
Eosinophils Relative: 4 % (ref 0–5)
HEMATOCRIT: 45.2 % (ref 39.0–52.0)
Hemoglobin: 15.6 g/dL (ref 13.0–17.0)
Lymphocytes Relative: 33 % (ref 12–46)
Lymphs Abs: 2.8 10*3/uL (ref 0.7–4.0)
MCH: 29.5 pg (ref 26.0–34.0)
MCHC: 34.5 g/dL (ref 30.0–36.0)
MCV: 85.4 fL (ref 78.0–100.0)
MPV: 10.7 fL (ref 8.6–12.4)
Monocytes Absolute: 0.6 10*3/uL (ref 0.1–1.0)
Monocytes Relative: 7 % (ref 3–12)
Neutro Abs: 4.6 10*3/uL (ref 1.7–7.7)
Neutrophils Relative %: 55 % (ref 43–77)
Platelets: 125 10*3/uL — ABNORMAL LOW (ref 150–400)
RBC: 5.29 MIL/uL (ref 4.22–5.81)
RDW: 13.3 % (ref 11.5–15.5)
WBC: 8.4 10*3/uL (ref 4.0–10.5)

## 2015-01-09 MED ORDER — HYDROCODONE-ACETAMINOPHEN 5-325 MG PO TABS: 1.0000 | ORAL_TABLET | Freq: Four times a day (QID) | ORAL | Status: DC | PRN

## 2015-01-09 NOTE — Patient Instructions (Signed)

## 2015-01-09 NOTE — Progress Notes (Signed)
Patient ID: Shawn PitcherBilly L Salazar, male   DOB: 09/17/1960, 54 y.o.   MRN: [id number]   This very nice 54 y.o. [name] presents for 3 month follow up with Hypertension, Hyperlipidemia, Pre-Diabetes and Vitamin D Deficiency.    Patient is treated for HTN & BP has been controlled at home. Today's BP: 120/74 mmHg. Patient has had no complaints of any cardiac type chest pain, palpitations, dyspnea/orthopnea/PND, dizziness, claudication, or dependent edema.   Hyperlipidemia is controlled with diet. Patient denies myalgias or other med SE's. Last Lipids were not at goal - Total Chol 199; HDL 25; LDL 114; with elevated Trig 298 on 10/03/2014:   Also, the patient has history of PreDiabetes and has had no symptoms of reactive hypoglycemia, diabetic polys, paresthesias or visual blurring.  Last A1c was  5.6% on 10/03/2014.     Further, the patient also has history of Vitamin D Deficiency and supplements vitamin D without any suspected side-effects. Last vitamin D was  36 on 10/03/2014.    Medication Sig  . ALPRAZolam  1 MG tablet TAKE 1/2-1 TABLET BY MOUTH 3 TIMES A DAY AS NEEDED ANXIETY  . VITAMIN D  Take 2,000 Int'l Units by mouth daily.   Marland Kitchen. econazole nitrate 1 % cream   . FLAXSEED OIL1200 MG CAPS Take by mouth daily.  Marland Kitchen. FISH OIL 1200 MG CAPS Take by mouth daily.  Marland Kitchen. omeprazole (PRILOSEC) 40 MG capsule TAKE 1 CAPSULE BY MOUTH DAILY  . NORCO 5-325 MG  Take 1 tablet by mouth every 6 (six) hours as needed for moderate pain.   Allergies  Allergen Reactions  . Niacin And Related Swelling    Red rash and swelling  . Gemfibrozil   . Lipitor [Atorvastatin]   . Penicillins Other (See Comments)    Unknown childhood allergy   PMHx:   Past Medical History  Diagnosis Date  . Hypercholesteremia   . Morbid obesity   . Bruise 08/08/11    pt states brusing x 1 month  . Tick bites 08/08/11    60 to 70 ticks in last hunting season , last > 1 month  . Disorder of both ears 08/08/2011    blood in ears in past month - put on  steroids, "tubes stopped up"  . Chest pain 08/08/2011    "a little in last month"  . Leg cramps 08/08/2011    bad in last 1.5 months  . Sleep apnea   . Acid reflux   . Arthritis   . Prediabetes   . Idiopathic thrombocytopenic purpura (ITP)   . Hypertension, Labile 11/03/2013   Immunization History  Administered Date(s) Administered  . Influenza Split 05/24/2013, 06/22/2014  . PPD Test 06/22/2014  . Pneumococcal Polysaccharide-23 05/19/2012  . Td 08/18/2008   Past Surgical History  Procedure Laterality Date  . Back surgery  07/2010    lumbar - 3 discs  . Fracture surgery    . Lumbar disc surgery  2011   FHx:    Reviewed / unchanged  SHx:    Reviewed / unchanged  Systems Review:  Constitutional: Denies fever, chills, wt changes, headaches, insomnia, fatigue, night sweats, change in appetite. Eyes: Denies redness, blurred vision, diplopia, discharge, itchy, watery eyes.  ENT: Denies discharge, congestion, post nasal drip, epistaxis, sore throat, earache, hearing loss, dental pain, tinnitus, vertigo, sinus pain, snoring.  CV: Denies chest pain, palpitations, irregular heartbeat, syncope, dyspnea, diaphoresis, orthopnea, PND, claudication or edema. Respiratory: denies cough, dyspnea, DOE, pleurisy, hoarseness, laryngitis, wheezing.  Gastrointestinal: Denies  dysphagia, odynophagia, heartburn, reflux, water brash, abdominal pain or cramps, nausea, vomiting, bloating, diarrhea, constipation, hematemesis, melena, hematochezia  or hemorrhoids. Genitourinary: Denies dysuria, frequency, urgency, nocturia, hesitancy, discharge, hematuria or flank pain. Musculoskeletal: C/o pain rt ankle aggrevated by standing and walking.  Skin: Denies pruritus, rash, hives, warts, acne, eczema or change in skin lesion(s). Neuro: No weakness, tremor, incoordination, spasms, paresthesia or pain. Psychiatric: Denies confusion, memory loss or sensory loss. Endo: Denies change in weight, skin or hair change.   Heme/Lymph: No excessive bleeding, bruising or enlarged lymph nodes.  Physical Exam  BP 120/74   Pulse 72  Temp 97.8 F   Resp 16  Ht    Wt 233 lb 12.8 oz     BMI 33.55   Appears well nourished and in no distress. Eyes: PERRLA, EOMs, conjunctiva no swelling or erythema. Sinuses: No frontal/maxillary tenderness ENT/Mouth: EAC's clear, TM's nl w/o erythema, bulging. Nares clear w/o erythema, swelling, exudates. Oropharynx clear without erythema or exudates. Oral hygiene is good. Tongue normal, non obstructing. Hearing intact.  Neck: Supple. Thyroid nl. Car 2+/2+ without bruits, nodes or JVD. Chest: Respirations nl with BS clear & equal w/o rales, rhonchi, wheezing or stridor.  Cor: Heart sounds normal w/ regular rate and rhythm without sig. murmurs, gallops, clicks, or rubs. Peripheral pulses normal and equal  without edema.  Abdomen: Soft & bowel sounds normal. Non-tender w/o guarding, rebound, hernias, masses, or organomegaly.  Lymphatics: Unremarkable.  Musculoskeletal: Full ROM all peripheral extremities, joint stability, 5/5 strength, and normal gait.  Skin: Warm, dry without exposed rashes, lesions or ecchymosis apparent.  Neuro: Cranial nerves intact, reflexes equal bilaterally. Sensory-motor testing grossly intact. Tendon reflexes grossly intact.  Pysch: Alert & oriented x 3.  Insight and judgement nl & appropriate. No ideations.  Assessment and Plan:  1. Essential hypertension  - TSH  2. Hyperlipidemia  - Lipid panel  3. Prediabetes  - Hemoglobin A1c - Insulin, random  4. Vitamin D deficiency  - Vit D  25 hydroxy (rtn osteoporosis monitoring)  5. Morbid obesity   6. GERD   7. Medication management  - CBC with Differential/Platelet - BASIC METABOLIC PANEL WITH GFR - Hepatic function panel - Magnesium  8. Pain in joint, ankle and foot, right  - HYDROcodone-acetaminophen (NORCO/VICODIN) 5-325 MG per tablet; Take 1 tablet by mouth every 6 (six)  hours as needed for moderate pain.  Dispense: 100 tablet; Refill: 0   Recommended regular exercise, BP monitoring, weight control, and discussed med and SE's. Recommended labs to assess and monitor clinical status. Further disposition pending results of labs. Over 30 minutes of exam, counseling, chart review was performed

## 2015-01-10 LAB — BASIC METABOLIC PANEL WITH GFR
BUN: 11 mg/dL (ref 6–23)
CHLORIDE: 101 meq/L (ref 96–112)
CO2: 24 meq/L (ref 19–32)
Calcium: 9.4 mg/dL (ref 8.4–10.5)
Creat: 1.02 mg/dL (ref 0.50–1.35)
GFR, Est African American: >89 mL/min
GFR, Est Non African American: 83 mL/min
GLUCOSE: 93 mg/dL (ref 70–99)
Potassium: 4.3 mEq/L (ref 3.5–5.3)
SODIUM: 134 meq/L — AB (ref 135–145)

## 2015-01-10 LAB — HEMOGLOBIN A1C
Hgb A1c MFr Bld: 5.7 % — ABNORMAL HIGH (ref <5.7)
Mean Plasma Glucose: 117 mg/dL — ABNORMAL HIGH (ref <117)

## 2015-01-10 LAB — HEPATIC FUNCTION PANEL
ALBUMIN: 4.6 g/dL (ref 3.5–5.2)
ALK PHOS: 100 U/L (ref 39–117)
ALT: 25 U/L (ref 0–53)
AST: 22 U/L (ref 0–37)
BILIRUBIN INDIRECT: 0.4 mg/dL (ref 0.2–1.2)
BILIRUBIN TOTAL: 0.5 mg/dL (ref 0.2–1.2)
Bilirubin, Direct: 0.1 mg/dL (ref 0.0–0.3)
Total Protein: 7.2 g/dL (ref 6.0–8.3)

## 2015-01-10 LAB — LIPID PANEL
Cholesterol: 186 mg/dL (ref 0–200)
HDL: 24 mg/dL — ABNORMAL LOW (ref >=40)
Total CHOL/HDL Ratio: 7.8 Ratio
Triglycerides: 656 mg/dL — ABNORMAL HIGH (ref <150)

## 2015-01-10 LAB — VITAMIN D 25 HYDROXY (VIT D DEFICIENCY, FRACTURES): VIT D 25 HYDROXY: 39 ng/mL (ref 30–100)

## 2015-01-10 LAB — MAGNESIUM: Magnesium: 2 mg/dL (ref 1.5–2.5)

## 2015-01-10 LAB — TSH: TSH: 2.389 u[IU]/mL (ref 0.350–4.500)

## 2015-01-10 LAB — INSULIN, RANDOM: INSULIN: 12.8 u[IU]/mL (ref 2.0–19.6)

## 2015-02-04 ENCOUNTER — Encounter: Payer: Self-pay | Admitting: Emergency Medicine

## 2015-02-04 ENCOUNTER — Emergency Department: Payer: 59

## 2015-02-04 ENCOUNTER — Emergency Department
Admission: EM | Admit: 2015-02-04 | Discharge: 2015-02-04 | Disposition: A | Discharge status: Home / Self Care | Payer: 59 | Attending: Emergency Medicine | Admitting: Emergency Medicine

## 2015-02-04 DIAGNOSIS — S0502XA Injury of conjunctiva and corneal abrasion without foreign body, left eye, initial encounter: Secondary | ICD-10-CM | POA: Insufficient documentation

## 2015-02-04 DIAGNOSIS — S060X9A Concussion with loss of consciousness of unspecified duration, initial encounter: Secondary | ICD-10-CM | POA: Diagnosis not present

## 2015-02-04 DIAGNOSIS — Z720 Tobacco use: Secondary | ICD-10-CM | POA: Insufficient documentation

## 2015-02-04 DIAGNOSIS — S93401A Sprain of unspecified ligament of right ankle, initial encounter: Secondary | ICD-10-CM | POA: Diagnosis not present

## 2015-02-04 DIAGNOSIS — Y9389 Activity, other specified: Secondary | ICD-10-CM | POA: Insufficient documentation

## 2015-02-04 DIAGNOSIS — S4991XA Unspecified injury of right shoulder and upper arm, initial encounter: Secondary | ICD-10-CM | POA: Diagnosis present

## 2015-02-04 DIAGNOSIS — Z041 Encounter for examination and observation following transport accident: Secondary | ICD-10-CM

## 2015-02-04 DIAGNOSIS — Z79899 Other long term (current) drug therapy: Secondary | ICD-10-CM | POA: Diagnosis not present

## 2015-02-04 DIAGNOSIS — Z880 Allergy status to penicillin: Secondary | ICD-10-CM | POA: Insufficient documentation

## 2015-02-04 DIAGNOSIS — T148XXA Other injury of unspecified body region, initial encounter: Secondary | ICD-10-CM

## 2015-02-04 DIAGNOSIS — S0990XA Unspecified injury of head, initial encounter: Secondary | ICD-10-CM

## 2015-02-04 DIAGNOSIS — S42022A Displaced fracture of shaft of left clavicle, initial encounter for closed fracture: Secondary | ICD-10-CM | POA: Insufficient documentation

## 2015-02-04 DIAGNOSIS — Y92410 Unspecified street and highway as the place of occurrence of the external cause: Secondary | ICD-10-CM | POA: Insufficient documentation

## 2015-02-04 DIAGNOSIS — Y998 Other external cause status: Secondary | ICD-10-CM | POA: Diagnosis not present

## 2015-02-04 DIAGNOSIS — S42002A Fracture of unspecified part of left clavicle, initial encounter for closed fracture: Secondary | ICD-10-CM

## 2015-02-04 LAB — BASIC METABOLIC PANEL
Anion gap: 13 (ref 5–15)
BUN: 11 mg/dL (ref 6–20)
CHLORIDE: 99 mmol/L — AB (ref 101–111)
CO2: 24 mmol/L (ref 22–32)
Calcium: 9 mg/dL (ref 8.9–10.3)
Creatinine, Ser: 0.98 mg/dL (ref 0.61–1.24)
GFR calc Af Amer: >60 mL/min (ref >60)
GFR calc non Af Amer: >60 mL/min (ref >60)
GLUCOSE: 102 mg/dL — AB (ref 65–99)
POTASSIUM: 3.3 mmol/L — AB (ref 3.5–5.1)
SODIUM: 136 mmol/L (ref 135–145)

## 2015-02-04 LAB — CBC
HCT: 44.2 % (ref 40.0–52.0)
Hemoglobin: 14.9 g/dL (ref 13.0–18.0)
MCH: 30.3 pg (ref 26.0–34.0)
MCHC: 33.8 g/dL (ref 32.0–36.0)
MCV: 89.6 fL (ref 80.0–100.0)
Platelets: 121 10*3/uL — ABNORMAL LOW (ref 150–440)
RBC: 4.94 MIL/uL (ref 4.40–5.90)
RDW: 13.5 % (ref 11.5–14.5)
WBC: 9.4 10*3/uL (ref 3.8–10.6)

## 2015-02-04 MED ORDER — SODIUM CHLORIDE 0.9 % IV BOLUS (SEPSIS)
1000.0000 mL | Freq: Once | INTRAVENOUS | Status: AC
  Administered 2015-02-04: 1000 mL via INTRAVENOUS

## 2015-02-04 MED ORDER — TETANUS-DIPHTHERIA TOXOIDS TD 5-2 LFU IM INJ
0.5000 mL | INJECTION | Freq: Once | INTRAMUSCULAR | Status: AC
  Administered 2015-02-04: 0.5 mL via INTRAMUSCULAR

## 2015-02-04 MED ORDER — OXYCODONE-ACETAMINOPHEN 5-325 MG PO TABS
1.0000 | ORAL_TABLET | Freq: Once | ORAL | Status: AC
  Administered 2015-02-04: 1 via ORAL

## 2015-02-04 MED ORDER — IOHEXOL 300 MG/ML  SOLN
100.0000 mL | Freq: Once | INTRAMUSCULAR | Status: AC | PRN
  Administered 2015-02-04: 100 mL via INTRAVENOUS

## 2015-02-04 MED ORDER — OXYCODONE-ACETAMINOPHEN 5-325 MG PO TABS: 1.0000 | ORAL_TABLET | Freq: Four times a day (QID) | ORAL | Status: DC | PRN

## 2015-02-04 MED ORDER — OXYCODONE-ACETAMINOPHEN 5-325 MG PO TABS
ORAL_TABLET | ORAL | Status: AC
  Administered 2015-02-04: 1 via ORAL
  Filled 2015-02-04: qty 1

## 2015-02-04 MED ORDER — TETANUS-DIPHTHERIA TOXOIDS TD 5-2 LFU IM INJ
INJECTION | INTRAMUSCULAR | Status: AC
  Administered 2015-02-04: 0.5 mL via INTRAMUSCULAR
  Filled 2015-02-04: qty 0.5
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
## 2015-02-04 NOTE — ED Notes (Signed)
Pt denies chest pain. Pt reports left shoulder pain 10/10.

## 2015-02-06 ENCOUNTER — Encounter (HOSPITAL_BASED_OUTPATIENT_CLINIC_OR_DEPARTMENT_OTHER): Payer: Self-pay | Admitting: *Deleted

## 2015-02-08 ENCOUNTER — Encounter (HOSPITAL_BASED_OUTPATIENT_CLINIC_OR_DEPARTMENT_OTHER): Payer: Self-pay

## 2015-02-08 ENCOUNTER — Ambulatory Visit (HOSPITAL_BASED_OUTPATIENT_CLINIC_OR_DEPARTMENT_OTHER)
Admission: RE | Admit: 2015-02-08 | Discharge: 2015-02-09 | Disposition: A | Discharge status: Home / Self Care | Payer: 59 | Source: Ambulatory Visit | Attending: Orthopedic Surgery | Admitting: Orthopedic Surgery

## 2015-02-08 ENCOUNTER — Encounter (HOSPITAL_BASED_OUTPATIENT_CLINIC_OR_DEPARTMENT_OTHER)
Admission: RE | Disposition: A | Discharge status: Home / Self Care | Payer: Self-pay | Source: Ambulatory Visit | Attending: Orthopedic Surgery

## 2015-02-08 ENCOUNTER — Ambulatory Visit (HOSPITAL_BASED_OUTPATIENT_CLINIC_OR_DEPARTMENT_OTHER): Payer: 59 | Admitting: Anesthesiology

## 2015-02-08 ENCOUNTER — Ambulatory Visit (HOSPITAL_COMMUNITY): Payer: 59

## 2015-02-08 DIAGNOSIS — Y999 Unspecified external cause status: Secondary | ICD-10-CM | POA: Diagnosis not present

## 2015-02-08 DIAGNOSIS — Z6831 Body mass index (BMI) 31.0-31.9, adult: Secondary | ICD-10-CM | POA: Diagnosis not present

## 2015-02-08 DIAGNOSIS — Z9989 Dependence on other enabling machines and devices: Secondary | ICD-10-CM | POA: Insufficient documentation

## 2015-02-08 DIAGNOSIS — G4730 Sleep apnea, unspecified: Secondary | ICD-10-CM | POA: Diagnosis not present

## 2015-02-08 DIAGNOSIS — Y939 Activity, unspecified: Secondary | ICD-10-CM | POA: Diagnosis not present

## 2015-02-08 DIAGNOSIS — K219 Gastro-esophageal reflux disease without esophagitis: Secondary | ICD-10-CM | POA: Insufficient documentation

## 2015-02-08 DIAGNOSIS — F17220 Nicotine dependence, chewing tobacco, uncomplicated: Secondary | ICD-10-CM | POA: Insufficient documentation

## 2015-02-08 DIAGNOSIS — E780 Pure hypercholesterolemia: Secondary | ICD-10-CM | POA: Diagnosis not present

## 2015-02-08 DIAGNOSIS — Z8781 Personal history of (healed) traumatic fracture: Secondary | ICD-10-CM

## 2015-02-08 DIAGNOSIS — D693 Immune thrombocytopenic purpura: Secondary | ICD-10-CM | POA: Insufficient documentation

## 2015-02-08 DIAGNOSIS — S42022A Displaced fracture of shaft of left clavicle, initial encounter for closed fracture: Secondary | ICD-10-CM | POA: Insufficient documentation

## 2015-02-08 DIAGNOSIS — Z79899 Other long term (current) drug therapy: Secondary | ICD-10-CM | POA: Diagnosis not present

## 2015-02-08 DIAGNOSIS — Z9889 Other specified postprocedural states: Secondary | ICD-10-CM

## 2015-02-08 DIAGNOSIS — Z79891 Long term (current) use of opiate analgesic: Secondary | ICD-10-CM | POA: Insufficient documentation

## 2015-02-08 DIAGNOSIS — I10 Essential (primary) hypertension: Secondary | ICD-10-CM | POA: Diagnosis not present

## 2015-02-08 DIAGNOSIS — Y929 Unspecified place or not applicable: Secondary | ICD-10-CM | POA: Diagnosis not present

## 2015-02-08 DIAGNOSIS — M1990 Unspecified osteoarthritis, unspecified site: Secondary | ICD-10-CM | POA: Diagnosis not present

## 2015-02-08 DIAGNOSIS — F17210 Nicotine dependence, cigarettes, uncomplicated: Secondary | ICD-10-CM | POA: Insufficient documentation

## 2015-02-08 DIAGNOSIS — S42002A Fracture of unspecified part of left clavicle, initial encounter for closed fracture: Secondary | ICD-10-CM | POA: Diagnosis present

## 2015-02-08 HISTORY — DX: Disease of blood and blood-forming organs, unspecified: D75.9

## 2015-02-08 HISTORY — DX: Fracture of unspecified part of left clavicle, initial encounter for closed fracture: S42.002A

## 2015-02-08 HISTORY — PX: ORIF CLAVICULAR FRACTURE: SHX5055

## 2015-02-08 SURGERY — OPEN REDUCTION INTERNAL FIXATION (ORIF) CLAVICULAR FRACTURE
Anesthesia: Regional | Site: Shoulder | Laterality: Left

## 2015-02-08 MED ORDER — BUPIVACAINE HCL (PF) 0.5 % IJ SOLN
INTRAMUSCULAR | Status: AC
  Filled 2015-02-08: qty 30

## 2015-02-08 MED ORDER — GLYCOPYRROLATE 0.2 MG/ML IJ SOLN: 0.2000 mg | Freq: Once | INTRAMUSCULAR | Status: DC | PRN

## 2015-02-08 MED ORDER — MIDAZOLAM HCL 2 MG/2ML IJ SOLN
INTRAMUSCULAR | Status: AC
  Filled 2015-02-08: qty 2

## 2015-02-08 MED ORDER — PANTOPRAZOLE SODIUM 40 MG PO TBEC
80.0000 mg | DELAYED_RELEASE_TABLET | Freq: Every day | ORAL | Status: DC
  Administered 2015-02-08: 80 mg via ORAL
  Filled 2015-02-08: qty 2

## 2015-02-08 MED ORDER — FENTANYL CITRATE (PF) 100 MCG/2ML IJ SOLN
INTRAMUSCULAR | Status: AC
  Filled 2015-02-08: qty 2

## 2015-02-08 MED ORDER — ZOLPIDEM TARTRATE 5 MG PO TABS
5.0000 mg | ORAL_TABLET | Freq: Every evening | ORAL | Status: DC | PRN
  Administered 2015-02-08: 5 mg via ORAL
  Filled 2015-02-08: qty 1

## 2015-02-08 MED ORDER — DEXAMETHASONE SODIUM PHOSPHATE 4 MG/ML IJ SOLN
INTRAMUSCULAR | Status: DC | PRN
  Administered 2015-02-08: 10 mg via INTRAVENOUS

## 2015-02-08 MED ORDER — OXYCODONE-ACETAMINOPHEN 5-325 MG PO TABS
1.0000 | ORAL_TABLET | ORAL | Status: DC | PRN
  Administered 2015-02-08: 1 via ORAL
  Administered 2015-02-09 (×2): 2 via ORAL
  Filled 2015-02-08 (×2): qty 2
  Filled 2015-02-08: qty 1

## 2015-02-08 MED ORDER — PROPOFOL 10 MG/ML IV BOLUS
INTRAVENOUS | Status: DC | PRN
  Administered 2015-02-08: 300 mg via INTRAVENOUS
  Administered 2015-02-08: 50 mg via INTRAVENOUS

## 2015-02-08 MED ORDER — OXYCODONE HCL 5 MG PO TABS
5.0000 mg | ORAL_TABLET | ORAL | Status: DC | PRN
  Administered 2015-02-08: 5 mg via ORAL

## 2015-02-08 MED ORDER — BUPIVACAINE-EPINEPHRINE (PF) 0.5% -1:200000 IJ SOLN
INTRAMUSCULAR | Status: DC | PRN
  Administered 2015-02-08: 25 mL via PERINEURAL

## 2015-02-08 MED ORDER — MIDAZOLAM HCL 2 MG/2ML IJ SOLN
1.0000 mg | INTRAMUSCULAR | Status: DC | PRN
  Administered 2015-02-08: 2 mg via INTRAVENOUS

## 2015-02-08 MED ORDER — METHOCARBAMOL 1000 MG/10ML IJ SOLN: 500.0000 mg | Freq: Four times a day (QID) | INTRAVENOUS | Status: DC | PRN

## 2015-02-08 MED ORDER — BACLOFEN 10 MG PO TABS: 10.0000 mg | ORAL_TABLET | Freq: Three times a day (TID) | ORAL | Status: DC

## 2015-02-08 MED ORDER — BUPIVACAINE HCL (PF) 0.25 % IJ SOLN
INTRAMUSCULAR | Status: AC
  Filled 2015-02-08: qty 30

## 2015-02-08 MED ORDER — HYDROMORPHONE HCL 1 MG/ML IJ SOLN: 0.5000 mg | INTRAMUSCULAR | Status: DC | PRN

## 2015-02-08 MED ORDER — HYDROMORPHONE HCL 1 MG/ML IJ SOLN
INTRAMUSCULAR | Status: AC
  Filled 2015-02-08: qty 1

## 2015-02-08 MED ORDER — OXYCODONE HCL 5 MG/5ML PO SOLN: 5.0000 mg | Freq: Once | ORAL | Status: AC | PRN

## 2015-02-08 MED ORDER — SENNA-DOCUSATE SODIUM 8.6-50 MG PO TABS: 2.0000 | ORAL_TABLET | Freq: Every day | ORAL | Status: DC

## 2015-02-08 MED ORDER — LACTATED RINGERS IV SOLN
INTRAVENOUS | Status: DC
  Administered 2015-02-08: 07:00:00 via INTRAVENOUS

## 2015-02-08 MED ORDER — ALPRAZOLAM 1 MG PO TABS: 1.0000 mg | ORAL_TABLET | Freq: Three times a day (TID) | ORAL | Status: DC | PRN

## 2015-02-08 MED ORDER — LIDOCAINE HCL (CARDIAC) 20 MG/ML IV SOLN
INTRAVENOUS | Status: DC | PRN
  Administered 2015-02-08: 60 mg via INTRAVENOUS

## 2015-02-08 MED ORDER — SCOPOLAMINE 1 MG/3DAYS TD PT72: 1.0000 | MEDICATED_PATCH | Freq: Once | TRANSDERMAL | Status: DC | PRN

## 2015-02-08 MED ORDER — METHOCARBAMOL 500 MG PO TABS
500.0000 mg | ORAL_TABLET | Freq: Four times a day (QID) | ORAL | Status: DC | PRN
  Administered 2015-02-08 – 2015-02-09 (×2): 500 mg via ORAL
  Filled 2015-02-08 (×2): qty 1

## 2015-02-08 MED ORDER — CEFAZOLIN SODIUM-DEXTROSE 2-3 GM-% IV SOLR
2.0000 g | INTRAVENOUS | Status: AC
  Administered 2015-02-08: 2 g via INTRAVENOUS

## 2015-02-08 MED ORDER — FENTANYL CITRATE (PF) 100 MCG/2ML IJ SOLN
50.0000 ug | INTRAMUSCULAR | Status: DC | PRN
  Administered 2015-02-08: 50 ug via INTRAVENOUS
  Administered 2015-02-08: 100 ug via INTRAVENOUS

## 2015-02-08 MED ORDER — HYDROMORPHONE HCL 1 MG/ML IJ SOLN
0.2500 mg | INTRAMUSCULAR | Status: DC | PRN
  Administered 2015-02-08 (×4): 0.5 mg via INTRAVENOUS

## 2015-02-08 MED ORDER — MEPERIDINE HCL 25 MG/ML IJ SOLN: 6.2500 mg | INTRAMUSCULAR | Status: DC | PRN

## 2015-02-08 MED ORDER — SUCCINYLCHOLINE CHLORIDE 20 MG/ML IJ SOLN
INTRAMUSCULAR | Status: DC | PRN
  Administered 2015-02-08: 140 mg via INTRAVENOUS

## 2015-02-08 MED ORDER — ONDANSETRON HCL 4 MG PO TABS: 4.0000 mg | ORAL_TABLET | Freq: Four times a day (QID) | ORAL | Status: DC | PRN

## 2015-02-08 MED ORDER — ONDANSETRON HCL 4 MG/2ML IJ SOLN
INTRAMUSCULAR | Status: DC | PRN
  Administered 2015-02-08: 4 mg via INTRAVENOUS

## 2015-02-08 MED ORDER — OXYCODONE HCL 5 MG PO TABS
5.0000 mg | ORAL_TABLET | Freq: Once | ORAL | Status: AC | PRN
  Filled 2015-02-08: qty 1

## 2015-02-08 MED ORDER — 0.9 % SODIUM CHLORIDE (POUR BTL) OPTIME
TOPICAL | Status: DC | PRN
  Administered 2015-02-08: 150 mL

## 2015-02-08 MED ORDER — METOCLOPRAMIDE HCL 5 MG PO TABS: 5.0000 mg | ORAL_TABLET | Freq: Three times a day (TID) | ORAL | Status: DC | PRN

## 2015-02-08 MED ORDER — SODIUM CHLORIDE 0.9 % IV SOLN
INTRAVENOUS | Status: DC
  Administered 2015-02-08 (×2): via INTRAVENOUS

## 2015-02-08 MED ORDER — ONDANSETRON HCL 4 MG/2ML IJ SOLN: 4.0000 mg | Freq: Four times a day (QID) | INTRAMUSCULAR | Status: DC | PRN

## 2015-02-08 MED ORDER — OXYCODONE-ACETAMINOPHEN 10-325 MG PO TABS: 1.0000 | ORAL_TABLET | Freq: Four times a day (QID) | ORAL | Status: DC | PRN

## 2015-02-08 MED ORDER — METOCLOPRAMIDE HCL 5 MG/ML IJ SOLN: 5.0000 mg | Freq: Three times a day (TID) | INTRAMUSCULAR | Status: DC | PRN

## 2015-02-08 MED ORDER — CEFAZOLIN SODIUM-DEXTROSE 2-3 GM-% IV SOLR
INTRAVENOUS | Status: AC
  Filled 2015-02-08: qty 50

## 2015-02-08 MED ORDER — CEFAZOLIN SODIUM 1-5 GM-% IV SOLN
1.0000 g | Freq: Four times a day (QID) | INTRAVENOUS | Status: AC
  Administered 2015-02-08 – 2015-02-09 (×3): 1 g via INTRAVENOUS
  Filled 2015-02-08 (×3): qty 50

## 2015-02-08 SURGICAL SUPPLY — 75 items
BIT DRILL 2.3 QUICK RELEASE (BIT) ×1 IMPLANT
BIT DRILL 2.8 QUICK RELEASE (BIT) ×1 IMPLANT
BIT DRILL QUICK RELEASE 2.0MM (INSTRUMENTS) ×1 IMPLANT
BLADE SURG 15 STRL LF DISP TIS (BLADE) ×2 IMPLANT
BLADE SURG 15 STRL SS (BLADE) ×2
CLEANER CAUTERY TIP 5X5 PAD (MISCELLANEOUS) IMPLANT
CLSR STERI-STRIP ANTIMIC 1/2X4 (GAUZE/BANDAGES/DRESSINGS) ×2 IMPLANT
COUNTERSINK MULTI SCREW (BIT) ×2
DECANTER SPIKE VIAL GLASS SM (MISCELLANEOUS) IMPLANT
DRAPE C-ARM 42X72 X-RAY (DRAPES) IMPLANT
DRAPE INCISE IOBAN 66X45 STRL (DRAPES) IMPLANT
DRAPE OEC MINIVIEW 54X84 (DRAPES) ×2 IMPLANT
DRAPE SURG 17X23 STRL (DRAPES) ×2 IMPLANT
DRAPE U 20/CS (DRAPES) ×2 IMPLANT
DRAPE U-SHAPE 47X51 STRL (DRAPES) ×2 IMPLANT
DRAPE U-SHAPE 76X120 STRL (DRAPES) ×4 IMPLANT
DRILL 2.3 QUICK RELEASE (BIT) ×2
DRILL 2.8 QUICK RELEASE (BIT) ×2
DRILL QUICK RELEASE 2.0MM (INSTRUMENTS) ×2
DURAPREP 26ML APPLICATOR (WOUND CARE) ×2 IMPLANT
ELECT REM PT RETURN 9FT ADLT (ELECTROSURGICAL) ×2
ELECTRODE REM PT RTRN 9FT ADLT (ELECTROSURGICAL) ×1 IMPLANT
GAUZE SPONGE 4X4 12PLY STRL (GAUZE/BANDAGES/DRESSINGS) ×2 IMPLANT
GAUZE SPONGE 4X4 16PLY XRAY LF (GAUZE/BANDAGES/DRESSINGS) IMPLANT
GAUZE XEROFORM 1X8 LF (GAUZE/BANDAGES/DRESSINGS) IMPLANT
GLOVE BIO SURGEON STRL SZ8 (GLOVE) ×2 IMPLANT
GLOVE BIOGEL PI IND STRL 7.0 (GLOVE) ×1 IMPLANT
GLOVE BIOGEL PI IND STRL 8 (GLOVE) ×2 IMPLANT
GLOVE BIOGEL PI INDICATOR 7.0 (GLOVE) ×1
GLOVE BIOGEL PI INDICATOR 8 (GLOVE) ×2
GLOVE ECLIPSE 6.5 STRL STRAW (GLOVE) ×2 IMPLANT
GLOVE EXAM NITRILE MD LF STRL (GLOVE) ×2 IMPLANT
GLOVE ORTHO TXT STRL SZ7.5 (GLOVE) ×2 IMPLANT
GOWN STRL REUS W/ TWL LRG LVL3 (GOWN DISPOSABLE) ×1 IMPLANT
GOWN STRL REUS W/ TWL XL LVL3 (GOWN DISPOSABLE) ×2 IMPLANT
GOWN STRL REUS W/TWL LRG LVL3 (GOWN DISPOSABLE) ×1
GOWN STRL REUS W/TWL XL LVL3 (GOWN DISPOSABLE) ×2
NS IRRIG 1000ML POUR BTL (IV SOLUTION) ×2 IMPLANT
PACK ARTHROSCOPY DSU (CUSTOM PROCEDURE TRAY) ×2 IMPLANT
PACK BASIN DAY SURGERY FS (CUSTOM PROCEDURE TRAY) ×2 IMPLANT
PAD CLEANER CAUTERY TIP 5X5 (MISCELLANEOUS)
PENCIL BUTTON HOLSTER BLD 10FT (ELECTRODE) ×2 IMPLANT
PLATE CLAVICLE LATERAL (Plate) ×2 IMPLANT
SCREW CORTICAL 2.3X14 (Screw) ×2 IMPLANT
SCREW COUNTERSINK MULTI SCREW (BIT) ×1 IMPLANT
SCREW HEXALOBE LOCKING 3.5X14M (Screw) ×2 IMPLANT
SCREW HEXALOBE LOCKING 3.5X16M (Screw) ×2 IMPLANT
SCREW HEXALOBE NON-LOCK 3.5X14 (Screw) ×2 IMPLANT
SCREW LOCK 12X3.5X HEXALOBE (Screw) ×2 IMPLANT
SCREW LOCKING 3.5X12 (Screw) ×2 IMPLANT
SCREW NON LOCKING HEX 3.5X18MM (Screw) ×4 IMPLANT
SCREW NON TOGG 2.3X18MM (Screw) ×2 IMPLANT
SCREW NONLOCK HEX 3.5X12 (Screw) ×2 IMPLANT
SLEEVE SCD COMPRESS KNEE MED (MISCELLANEOUS) ×2 IMPLANT
SLING ARM IMMOBILIZER LRG (SOFTGOODS) IMPLANT
SLING ARM IMMOBILIZER MED (SOFTGOODS) IMPLANT
SLING ARM LRG ADULT FOAM STRAP (SOFTGOODS) IMPLANT
SLING ARM MED ADULT FOAM STRAP (SOFTGOODS) IMPLANT
SLING ARM XL FOAM STRAP (SOFTGOODS) IMPLANT
SPONGE LAP 4X18 X RAY DECT (DISPOSABLE) ×4 IMPLANT
SUCTION FRAZIER TIP 10 FR DISP (SUCTIONS) ×2 IMPLANT
SUT FIBERWIRE #2 38 T-5 BLUE (SUTURE)
SUT MNCRL AB 4-0 PS2 18 (SUTURE) IMPLANT
SUT VIC AB 0 CT1 18XCR BRD 8 (SUTURE) ×1 IMPLANT
SUT VIC AB 0 CT1 27 (SUTURE)
SUT VIC AB 0 CT1 27XBRD ANBCTR (SUTURE) IMPLANT
SUT VIC AB 0 CT1 8-18 (SUTURE) ×1
SUT VIC AB 2-0 SH 27 (SUTURE)
SUT VIC AB 2-0 SH 27XBRD (SUTURE) IMPLANT
SUT VICRYL 3-0 CR8 SH (SUTURE) ×2 IMPLANT
SUTURE FIBERWR #2 38 T-5 BLUE (SUTURE) IMPLANT
SYR BULB 3OZ (MISCELLANEOUS) ×2 IMPLANT
TAPE STRIPS DRAPE STRL (GAUZE/BANDAGES/DRESSINGS) IMPLANT
TOWEL OR 17X24 6PK STRL BLUE (TOWEL DISPOSABLE) ×2 IMPLANT
YANKAUER SUCT BULB TIP NO VENT (SUCTIONS) IMPLANT

## 2015-02-08 NOTE — Transfer of Care (Signed)
Immediate Anesthesia Transfer of Care Note  Patient: [name]  Procedure(s) Performed: Procedure(s): OPEN REDUCTION INTERNAL FIXATION (ORIF) LEFT CLAVICULAR FRACTURE (Left)  Patient Location: PACU  Anesthesia Type:General  Level of Consciousness: awake and sedated  Airway & Oxygen Therapy: Patient Spontanous Breathing and Patient connected to face mask oxygen  Post-op Assessment: Report given to RN and Post -op Vital signs reviewed and stable  Post vital signs: Reviewed and stable  Last Vitals:  Filed Vitals:   02/08/15 0715  BP: 124/70  Pulse: 77  Temp:   Resp: 19    Complications: No apparent anesthesia complications

## 2015-02-08 NOTE — Anesthesia Procedure Notes (Addendum)
Anesthesia Regional Block:  Interscalene brachial plexus block  Pre-Anesthetic Checklist: ,, timeout performed, Correct Patient, Correct Site, Correct Laterality, Correct Procedure, Correct Position, site marked, Risks and benefits discussed,  Surgical consent,  Pre-op evaluation,  At surgeon's request and post-op pain management  Laterality: Left and Upper  Prep: chloraprep       Needles:  Injection technique: Single-shot  Needle Type: Echogenic Needle     Needle Length: 5cm 5 cm Needle Gauge: 21 and 21 G    Additional Needles:  Procedures: ultrasound guided (picture in chart) Interscalene brachial plexus block Narrative:  Start time: 02/08/2015 7:11 AM End time: 02/08/2015 7:16 AM Injection made incrementally with aspirations every 5 mL.  Performed by: Personally  Anesthesiologist: CREWS, DAVID   Procedure Name: Intubation Performed by: York Grice Pre-anesthesia Checklist: Patient identified, Timeout performed, Emergency Drugs available, Suction available and Patient being monitored Patient Re-evaluated:Patient Re-evaluated prior to inductionOxygen Delivery Method: Circle system utilized Preoxygenation: Pre-oxygenation with 100% oxygen Intubation Type: IV induction Ventilation: Mask ventilation without difficulty Laryngoscope Size: Miller and 2 Grade View: Grade I Tube type: Oral Tube size: 7.0 mm Number of attempts: 1 Airway Equipment and Method: Stylet Placement Confirmation: ETT inserted through vocal cords under direct vision,  breath sounds checked- equal and bilateral and positive ETCO2 Secured at: 22 cm Tube secured with: Tape Dental Injury: Teeth and Oropharynx as per pre-operative assessment

## 2015-02-08 NOTE — Discharge Instructions (Signed)
Diet: As you were doing prior to hospitalization  ° °Shower:  May shower but keep the wounds dry, use an occlusive plastic wrap, NO SOAKING IN TUB.  If the bandage gets wet, change with a clean dry gauze. ° °Dressing:  You may change your dressing 3-5 days after surgery.  Then change the dressing daily with sterile gauze dressing.   ° °There are sticky tapes (steri-strips) on your wounds and all the stitches are absorbable.  Leave the steri-strips in place when changing your dressings, they will peel off with time, usually 2-3 weeks. ° °Activity:  Increase activity slowly as tolerated, but follow the weight bearing instructions below.  No lifting or driving for 6 weeks. ° °Weight Bearing:   Sling at all times..   ° °To prevent constipation: you may use a stool softener such as - ° °Colace (over the counter) 100 mg by mouth twice a day  °Drink plenty of fluids (prune juice may be helpful) and high fiber foods °Miralax (over the counter) for constipation as needed.   ° °Itching:  If you experience itching with your medications, try taking only a single pain pill, or even half a pain pill at a time.  You may take up to 10 pain pills per day, and you can also use benadryl over the counter for itching or also to help with sleep.  ° °Precautions:  If you experience chest pain or shortness of breath - call 911 immediately for transfer to the hospital emergency department!! ° °If you develop a fever greater that 101 F, purulent drainage from wound, increased redness or drainage from wound, or calf pain -- Call the office at [phone]                                                °Follow- Up Appointment:  Please call for an appointment to be seen in 2 weeks [place] - [phone] ° ° ° ° ° °

## 2015-02-08 NOTE — Progress Notes (Signed)
Assisted Dr. Crews with left, ultrasound guided, interscalene  block. Side rails up, monitors on throughout procedure. See vital signs in flow sheet. Tolerated Procedure well. 

## 2015-02-08 NOTE — Op Note (Signed)
02/08/2015  9:07 AM  PATIENT:  [name] L Trulock    PRE-OPERATIVE DIAGNOSIS:  LEFT CLAVICLE FRACTURE, midshaft, acute, comminuted, displaced  POST-OPERATIVE DIAGNOSIS:  Same  PROCEDURE:  OPEN REDUCTION INTERNAL FIXATION (ORIF) LEFT CLAVICULAR FRACTURE  SURGEON:  [name], MD  PHYSICIAN ASSISTANT: Janace Litten, OPA-C, present and scrubbed throughout the case, critical for completion in a timely fashion, and for retraction, instrumentation, and closure.  ANESTHESIA:   General  PREOPERATIVE INDICATIONS:  [name] is a  54 y.o. male with a diagnosis of LEFT CLAVICLE FRACTURE who elected for surgical management based on preoperative shortening and angulation and displacement of the fracture.    The risks benefits and alternatives were discussed with the patient preoperatively including but not limited to the risks of infection, bleeding, nerve injury, malunion, nonunion, hardware failure, the need for hardware removal, recurrent fracture, cardiopulmonary complications, the need for revision surgery, among others, and the patient was willing to proceed.    OPERATIVE IMPLANTS: Acumed long distal third locking clavicle plate  OPERATIVE FINDINGS: Shortened, displaced clavicle fracture, comminuted, 4 pieces with an additional small splintered segment. The medial segment of the clavicle had an inferior segment that was comminuted and splintered separately, there was a1 mm x 8 mm superior sliver, which was not amenable to fixation. There was a larger butterfly segment laterally which I was able to lag to the lateral segment, and I also did place a lag screw medially, which did not have great fixation, but provided a little bit of provisional stability to optimize positioning of the plate. I did get 4 lateral screws, 3 of which were locking one was bicortical nonlocking, and I got 2 medial bicortical screws, 2 medial unicortical screws.  OPERATIVE PROCEDURE: The patient was brought to the  operating room and placed in the supine position. General anesthesia was administered. IV antibiotics were given. He was placed in the beach chair position. The upper extremity was prepped and draped in the usual sterile fashion. Time out was performed. Incision was made over the clavicle fracture. Dissection was carried down through the platysma, and the fracture site exposed. The fracture was extremely short and comminuted. First I reduced the large butterfly segment to the lateral segment and held it provisionally with clamps and then placed the lag screw using a 2.0 millimeters screw. I then reduced the fracture site anatomically to the medial segment, and placed a second lag screw although I suspect that the comminution medially did not allow for excellent fixation, nonetheless it was adequate to hold the fracture provisionally while I placed the plate into appropriate location. The lateral flare of the distal clavicle did not allow for movement of the plate completely to the edge, but I did get the position such that the plate would have excellent opposition to the bone and still get 4 screws laterally.  I secured the plate initially with a medial screw, bicortical, and then laterally with a nonlocking bicortical screw. I took a C-arm picture to confirm satisfactory reduction. I then secured the plate with bicortical screw medially, and then 2 unicortical medial screws due to the inferior comminution, and then secured the plate distally with lateral locking screws.  The central portion of the plate where I did not place screws was entirely filled with comminuted fracture lines although they were overall anatomically reduced. Therefore I did not place any screws centrally, using more of a bridge plating technique.   I had excellent bony apposition and restoration of anatomic alignment of  the clavicle. Used C-arm to confirm appropriate alignment, reduction of the fracture, and positioning of the plate and  length of the screws.  I then took final C-arm pictures, irrigated the wounds copiously, and repaired the fascia with inverted figure-of-eight Vicryl suture. The subcutaneous tissue was closed with Vicryl as well, and the skin closed with steri-strips, and the patient was awakened and returned to the PACU in stable and satisfactory condition. There were no complications.
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
## 2015-02-08 NOTE — Anesthesia Preprocedure Evaluation (Addendum)
Anesthesia Evaluation  Patient identified by MRN, date of birth, ID band Patient awake    Reviewed: Allergy & Precautions, NPO status , Patient's Chart, lab work & pertinent test results  Airway Mallampati: I  TM Distance: >3 FB Neck ROM: Full    Dental  (+) Upper Dentures, Lower Dentures, Dental Advisory Given   Pulmonary sleep apnea and Continuous Positive Airway Pressure Ventilation , former smoker,  breath sounds clear to auscultation        Cardiovascular hypertension, Pt. on medications Rhythm:Regular Rate:Normal     Neuro/Psych    GI/Hepatic GERD-  Medicated and Controlled,  Endo/Other  Morbid obesity  Renal/GU      Musculoskeletal   Abdominal   Peds  Hematology   Anesthesia Other Findings   Reproductive/Obstetrics                            Anesthesia Physical Anesthesia Plan  ASA: III  Anesthesia Plan: General and Regional   Post-op Pain Management:    Induction: Intravenous  Airway Management Planned: Oral ETT  Additional Equipment:   Intra-op Plan:   Post-operative Plan: Extubation in OR  Informed Consent: I have reviewed the patients History and Physical, chart, labs and discussed the procedure including the risks, benefits and alternatives for the proposed anesthesia with the patient or authorized representative who has indicated his/her understanding and acceptance.   Dental advisory given  Plan Discussed with: Anesthesiologist, CRNA and Surgeon  Anesthesia Plan Comments:         Anesthesia Quick Evaluation

## 2015-02-08 NOTE — Anesthesia Postprocedure Evaluation (Signed)
  Anesthesia Post-op Note  Patient: [name]  Procedure(s) Performed: Procedure(s): OPEN REDUCTION INTERNAL FIXATION (ORIF) LEFT CLAVICULAR FRACTURE (Left)  Patient Location: PACU  Anesthesia Type: General, Regional   Level of Consciousness: awake, alert  and oriented  Airway and Oxygen Therapy: Patient Spontanous Breathing  Post-op Pain: none  Post-op Assessment: Post-op Vital signs reviewed  Post-op Vital Signs: Reviewed  Last Vitals:  Filed Vitals:   02/08/15 1200  BP: 141/83  Pulse: 93  Temp: 36.1 C  Resp: 16    Complications: No apparent anesthesia complications

## 2015-02-09 ENCOUNTER — Encounter (HOSPITAL_BASED_OUTPATIENT_CLINIC_OR_DEPARTMENT_OTHER): Payer: Self-pay | Admitting: Orthopedic Surgery

## 2015-02-09 DIAGNOSIS — S42022A Displaced fracture of shaft of left clavicle, initial encounter for closed fracture: Secondary | ICD-10-CM | POA: Diagnosis not present

## 2015-06-05 ENCOUNTER — Other Ambulatory Visit: Payer: Self-pay | Admitting: Internal Medicine

## 2015-07-03 ENCOUNTER — Ambulatory Visit (INDEPENDENT_AMBULATORY_CARE_PROVIDER_SITE_OTHER): Payer: 59 | Admitting: Internal Medicine

## 2015-07-03 ENCOUNTER — Encounter: Payer: Self-pay | Admitting: Internal Medicine

## 2015-07-03 VITALS — BP 126/82 | HR 96 | Temp 97.5°F | Resp 16 | Ht 69.75 in | Wt 224.0 lb

## 2015-07-03 DIAGNOSIS — Z125 Encounter for screening for malignant neoplasm of prostate: Secondary | ICD-10-CM

## 2015-07-03 DIAGNOSIS — I10 Essential (primary) hypertension: Secondary | ICD-10-CM | POA: Diagnosis not present

## 2015-07-03 DIAGNOSIS — Z0000 Encounter for general adult medical examination without abnormal findings: Secondary | ICD-10-CM | POA: Diagnosis not present

## 2015-07-03 DIAGNOSIS — R7303 Prediabetes: Secondary | ICD-10-CM

## 2015-07-03 DIAGNOSIS — E785 Hyperlipidemia, unspecified: Secondary | ICD-10-CM

## 2015-07-03 DIAGNOSIS — K2100 Gastro-esophageal reflux disease with esophagitis, without bleeding: Secondary | ICD-10-CM

## 2015-07-03 DIAGNOSIS — Z111 Encounter for screening for respiratory tuberculosis: Secondary | ICD-10-CM

## 2015-07-03 DIAGNOSIS — Z0001 Encounter for general adult medical examination with abnormal findings: Secondary | ICD-10-CM

## 2015-07-03 DIAGNOSIS — E559 Vitamin D deficiency, unspecified: Secondary | ICD-10-CM

## 2015-07-03 DIAGNOSIS — Z23 Encounter for immunization: Secondary | ICD-10-CM

## 2015-07-03 DIAGNOSIS — R5383 Other fatigue: Secondary | ICD-10-CM

## 2015-07-03 DIAGNOSIS — E669 Obesity, unspecified: Secondary | ICD-10-CM | POA: Insufficient documentation

## 2015-07-03 DIAGNOSIS — Z6833 Body mass index (BMI) 33.0-33.9, adult: Secondary | ICD-10-CM

## 2015-07-03 DIAGNOSIS — Z1212 Encounter for screening for malignant neoplasm of rectum: Secondary | ICD-10-CM

## 2015-07-03 DIAGNOSIS — G894 Chronic pain syndrome: Secondary | ICD-10-CM

## 2015-07-03 DIAGNOSIS — Z79899 Other long term (current) drug therapy: Secondary | ICD-10-CM

## 2015-07-03 LAB — HEPATIC FUNCTION PANEL
ALBUMIN: 4.4 g/dL (ref 3.6–5.1)
ALK PHOS: 98 U/L (ref 40–115)
ALT: 15 U/L (ref 9–46)
AST: 17 U/L (ref 10–35)
BILIRUBIN DIRECT: 0.1 mg/dL (ref <=0.2)
BILIRUBIN TOTAL: 0.7 mg/dL (ref 0.2–1.2)
Indirect Bilirubin: 0.6 mg/dL (ref 0.2–1.2)
Total Protein: 6.8 g/dL (ref 6.1–8.1)

## 2015-07-03 LAB — CBC WITH DIFFERENTIAL/PLATELET
BASOS ABS: 0 10*3/uL (ref 0.0–0.1)
Basophils Relative: 0 % (ref 0–1)
EOS PCT: 2 % (ref 0–5)
Eosinophils Absolute: 0.2 10*3/uL (ref 0.0–0.7)
HEMATOCRIT: 41.8 % (ref 39.0–52.0)
HEMOGLOBIN: 14.6 g/dL (ref 13.0–17.0)
LYMPHS ABS: 2.5 10*3/uL (ref 0.7–4.0)
LYMPHS PCT: 27 % (ref 12–46)
MCH: 30.1 pg (ref 26.0–34.0)
MCHC: 34.9 g/dL (ref 30.0–36.0)
MCV: 86.2 fL (ref 78.0–100.0)
MONO ABS: 0.7 10*3/uL (ref 0.1–1.0)
MPV: 9.8 fL (ref 8.6–12.4)
Monocytes Relative: 7 % (ref 3–12)
Neutro Abs: 6 10*3/uL (ref 1.7–7.7)
Neutrophils Relative %: 64 % (ref 43–77)
Platelets: 132 10*3/uL — ABNORMAL LOW (ref 150–400)
RBC: 4.85 MIL/uL (ref 4.22–5.81)
RDW: 13.8 % (ref 11.5–15.5)
WBC: 9.4 10*3/uL (ref 4.0–10.5)

## 2015-07-03 LAB — LIPID PANEL
CHOL/HDL RATIO: 7.3 ratio — AB (ref <=5.0)
Cholesterol: 183 mg/dL (ref 125–200)
HDL: 25 mg/dL — AB (ref >=40)
LDL Cholesterol: 127 mg/dL (ref <130)
TRIGLYCERIDES: 156 mg/dL — AB (ref <150)
VLDL: 31 mg/dL — ABNORMAL HIGH (ref <30)

## 2015-07-03 LAB — IRON AND TIBC
%SAT: 38 % (ref 15–60)
IRON: 132 ug/dL (ref 50–180)
TIBC: 347 ug/dL (ref 250–425)
UIBC: 215 ug/dL (ref 125–400)

## 2015-07-03 LAB — BASIC METABOLIC PANEL WITH GFR
BUN: 9 mg/dL (ref 7–25)
CO2: 25 mmol/L (ref 20–31)
Calcium: 9.4 mg/dL (ref 8.6–10.3)
Chloride: 104 mmol/L (ref 98–110)
Creat: 0.88 mg/dL (ref 0.70–1.33)
GFR, Est Non African American: >89 mL/min (ref >=60)
Glucose, Bld: 89 mg/dL (ref 65–99)
POTASSIUM: 4.2 mmol/L (ref 3.5–5.3)
SODIUM: 139 mmol/L (ref 135–146)

## 2015-07-03 LAB — MAGNESIUM: MAGNESIUM: 1.9 mg/dL (ref 1.5–2.5)

## 2015-07-03 NOTE — Patient Instructions (Signed)
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
## 2015-07-04 LAB — TESTOSTERONE: Testosterone: 490 ng/dL (ref 300–890)

## 2015-07-04 LAB — URINALYSIS, ROUTINE W REFLEX MICROSCOPIC
BILIRUBIN URINE: NEGATIVE
Glucose, UA: NEGATIVE
HGB URINE DIPSTICK: NEGATIVE
KETONES UR: NEGATIVE
Leukocytes, UA: NEGATIVE
Nitrite: NEGATIVE
PH: 6 (ref 5.0–8.0)
Protein, ur: NEGATIVE
SPECIFIC GRAVITY, URINE: 1.021 (ref 1.001–1.035)

## 2015-07-04 LAB — HEMOGLOBIN A1C
Hgb A1c MFr Bld: 5.6 % (ref <5.7)
Mean Plasma Glucose: 114 mg/dL (ref <117)

## 2015-07-04 LAB — INSULIN, RANDOM: INSULIN: 10 u[IU]/mL (ref 2.0–19.6)

## 2015-07-04 LAB — VITAMIN B12: VITAMIN B 12: 579 pg/mL (ref 211–911)

## 2015-07-04 LAB — VITAMIN D 25 HYDROXY (VIT D DEFICIENCY, FRACTURES): VIT D 25 HYDROXY: 91 ng/mL (ref 30–100)

## 2015-07-04 LAB — TSH: TSH: 0.565 u[IU]/mL (ref 0.350–4.500)

## 2015-07-04 LAB — MICROALBUMIN / CREATININE URINE RATIO
Creatinine, Urine: 239 mg/dL (ref 20–370)
Microalb Creat Ratio: 6 mcg/mg creat (ref <30)
Microalb, Ur: 1.5 mg/dL

## 2015-07-04 LAB — PSA: PSA: 0.33 ng/mL (ref <=4.00)

## 2015-07-05 LAB — TB SKIN TEST
Induration: 0 mm
TB SKIN TEST: NEGATIVE

## 2015-07-13 ENCOUNTER — Other Ambulatory Visit: Payer: Self-pay | Admitting: Internal Medicine

## 2015-07-13 DIAGNOSIS — F419 Anxiety disorder, unspecified: Secondary | ICD-10-CM

## 2015-08-07 ENCOUNTER — Other Ambulatory Visit: Payer: Self-pay | Admitting: *Deleted

## 2015-08-07 DIAGNOSIS — Z1212 Encounter for screening for malignant neoplasm of rectum: Secondary | ICD-10-CM

## 2015-08-07 DIAGNOSIS — Z0001 Encounter for general adult medical examination with abnormal findings: Secondary | ICD-10-CM

## 2015-08-07 LAB — POC HEMOCCULT BLD/STL (HOME/3-CARD/SCREEN)
Card #2 Fecal Occult Blod, POC: NEGATIVE
FECAL OCCULT BLD: NEGATIVE
Fecal Occult Blood, POC: NEGATIVE

## 2015-10-01 ENCOUNTER — Other Ambulatory Visit: Payer: Self-pay | Admitting: Internal Medicine

## 2015-10-01 DIAGNOSIS — F419 Anxiety disorder, unspecified: Secondary | ICD-10-CM

## 2015-10-30 ENCOUNTER — Other Ambulatory Visit: Payer: Self-pay | Admitting: Internal Medicine

## 2016-01-03 ENCOUNTER — Encounter: Payer: Self-pay | Admitting: Internal Medicine

## 2016-01-03 ENCOUNTER — Ambulatory Visit (INDEPENDENT_AMBULATORY_CARE_PROVIDER_SITE_OTHER): Payer: BLUE CROSS/BLUE SHIELD | Admitting: Internal Medicine

## 2016-01-03 VITALS — BP 106/60 | HR 84 | Temp 98.2°F | Resp 18 | Ht 69.75 in | Wt 224.0 lb

## 2016-01-03 DIAGNOSIS — R5383 Other fatigue: Secondary | ICD-10-CM | POA: Diagnosis not present

## 2016-01-03 DIAGNOSIS — Z79899 Other long term (current) drug therapy: Secondary | ICD-10-CM | POA: Diagnosis not present

## 2016-01-03 DIAGNOSIS — R7303 Prediabetes: Secondary | ICD-10-CM

## 2016-01-03 DIAGNOSIS — I10 Essential (primary) hypertension: Secondary | ICD-10-CM | POA: Diagnosis not present

## 2016-01-03 DIAGNOSIS — E785 Hyperlipidemia, unspecified: Secondary | ICD-10-CM | POA: Diagnosis not present

## 2016-01-03 DIAGNOSIS — E559 Vitamin D deficiency, unspecified: Secondary | ICD-10-CM

## 2016-01-03 MED ORDER — TRAZODONE HCL 100 MG PO TABS: ORAL_TABLET | ORAL | Status: DC

## 2016-01-03 NOTE — Progress Notes (Signed)
Assessment and Plan:  Hypertension:  -Continue medication,  -monitor blood pressure at home.  -Continue DASH diet.   -Reminder to go to the ER if any CP, SOB, nausea, dizziness, severe HA, changes vision/speech, left arm numbness and tingling, and jaw pain.  Cholesterol: -Continue diet and exercise.  -Check cholesterol.   Pre-diabetes: -Continue diet and exercise.  -Check A1C  Vitamin D Def: -check level -continue medications.   Ear crusting -possible early actinic keratosis -recommended cryo therapy at next visit -sunscreen and wide brim hat recommended  Continue diet and meds as discussed. Further disposition pending results of labs.  HPI 55 y.o. male  presents for 3 month follow up with hypertension, hyperlipidemia, prediabetes and vitamin D.   His blood pressure has been controlled at home, today their BP is BP: 106/60 mmHg.   He does workout. He denies chest pain, shortness of breath, dizziness.  He reports that he has been getting a lot of walking in.  He has been walking every where he goes.     He is on cholesterol medication and denies myalgias. His cholesterol is at goal. The cholesterol last visit was:   Lab Results  Component Value Date   CHOL 183 07/03/2015   HDL 25* 07/03/2015   LDLCALC 127 07/03/2015   TRIG 156* 07/03/2015   CHOLHDL 7.3* 07/03/2015     He has been working on diet and exercise for prediabetes, and denies foot ulcerations, hyperglycemia, hypoglycemia , increased appetite, nausea, paresthesia of the feet, polydipsia, polyuria, visual disturbances, vomiting and weight loss. Last A1C in the office was:  Lab Results  Component Value Date   HGBA1C 5.6 07/03/2015    Patient is on Vitamin D supplement.  Lab Results  Component Value Date   VD25OH 46 07/03/2015     He reports that he has been having some issues with his ears.  He is concerned for skin cancer.   Current Medications:  Current Outpatient Prescriptions on File Prior to Visit   Medication Sig Dispense Refill  . ALPRAZolam (XANAX) 1 MG tablet TAKE 1/2 TO 1 TALET BY MOUTH 3 TIMES A DAY AS NEEDED FOR ANXIETY 90 tablet 5  . Cholecalciferol (VITAMIN D PO) Take 10,000 Int'l Units by mouth daily.     . Flaxseed, Linseed, (FLAXSEED OIL) 1200 MG CAPS Take 1 capsule by mouth daily.     . Omega-3 Fatty Acids (FISH OIL) 1200 MG CAPS Take 1 capsule by mouth daily.     Marland Kitchen omeprazole (PRILOSEC) 40 MG capsule TAKE 1 CAPSULE BY MOUTH DAILY 90 capsule 1   No current facility-administered medications on file prior to visit.    Medical History:  Past Medical History  Diagnosis Date  . Hypercholesteremia   . Morbid obesity (HCC)   . Bruise 08/08/11    pt states brusing x 1 month  . Tick bites 08/08/11    60 to 70 ticks in last hunting season , last > 1 month  . 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 Chest pain 08/08/2011    "a little in last month"  . Leg cramps 08/08/2011    bad in last 1.5 months  . Acid reflux   . Arthritis   . Prediabetes   . Idiopathic thrombocytopenic purpura (ITP)   . Hypertension, Labile 11/03/2013    no meds  . Blood dyscrasia     ITP  . Sleep apnea  wears CPAP "sometimes"  . Fracture of left clavicle 02/08/2015    Allergies:  Allergies  Allergen Reactions  . Niacin And Related Swelling    Red rash and swelling  . Gemfibrozil   . Lipitor [Atorvastatin]   . Penicillins Other (See Comments)    Unknown childhood allergy Received 2 Gms Ancef with no obvious reaction     Review of Systems:  Review of Systems  Constitutional: Negative for fever, chills and malaise/fatigue.  HENT: Negative for congestion, ear pain and sore throat.   Respiratory: Negative for cough, shortness of breath and wheezing.   Cardiovascular: Negative for chest pain, palpitations and leg swelling.  Gastrointestinal: Positive for constipation. Negative for heartburn, abdominal pain, diarrhea,  blood in stool and melena.  Genitourinary: Negative.   Skin:       Bilateral ear lesions  Neurological: Negative for dizziness, sensory change, loss of consciousness and headaches.  Psychiatric/Behavioral: Negative for depression. The patient is not nervous/anxious and does not have insomnia.     Family history- Review and unchanged  Social history- Review and unchanged  Physical Exam: BP 106/60 mmHg  Pulse 84  Temp(Src) 98.2 F (36.8 C) (Temporal)  Resp 18  Ht 5' 9.75" (1.772 m)  Wt 224 lb (101.606 kg)  BMI 32.36 kg/m2 Wt Readings from Last 3 Encounters:  01/03/16 224 lb (101.606 kg)  07/03/15 224 lb (101.606 kg)  02/08/15 227 lb 4 oz (103.08 kg)    General Appearance: Well nourished well developed, in no apparent distress. Eyes: PERRLA, EOMs, conjunctiva no swelling or erythema ENT/Mouth: Ear canals normal without obstruction, swelling, erythma, discharge.  TMs normal bilaterally.  Oropharynx moist, clear, without exudate, or postoropharyngeal swelling. Neck: Supple, thyroid normal,no cervical adenopathy  Respiratory: Respiratory effort normal, Breath sounds clear A&P without rhonchi, wheeze, or rale.  No retractions, no accessory usage. Cardio: RRR with no MRGs. Brisk peripheral pulses without edema.  Abdomen: Soft, + BS,  Non tender, no guarding, rebound, hernias, masses. Musculoskeletal: Full ROM, 5/5 strength, Normal gait Skin: Warm, dry without rashes, lesions, ecchymosis. Bilateral crusting to the upper ears.  0.5 mm in size.   Neuro: Awake and oriented X 3, Cranial nerves intact. Normal muscle tone, no cerebellar symptoms. Psych: Normal affect, Insight and Judgment appropriate.    Terri Piedraourtney Forcucci, PA-C 3:54 PM [facility]

## 2016-01-03 NOTE — Patient Instructions (Signed)
Trazodone tablets  What is this medicine?  TRAZODONE (TRAZ oh done) is used to treat depression.  This medicine may be used for other purposes; ask your health care provider or pharmacist if you have questions.  What should I tell my health care provider before I take this medicine?  They need to know if you have any of these conditions:  -attempted suicide or thinking about it  -bipolar disorder  -bleeding problems  -glaucoma  -heart disease, or previous heart attack  -irregular heart beat  -kidney or liver disease  -low levels of sodium in the blood  -an unusual or allergic reaction to trazodone, other medicines, foods, dyes or preservatives  -pregnant or trying to get pregnant  -breast-feeding  How should I use this medicine?  Take this medicine by mouth with a glass of water. Follow the directions on the prescription label. Take this medicine shortly after a meal or a light snack. Take your medicine at regular intervals. Do not take your medicine more often than directed. Do not stop taking this medicine suddenly except upon the advice of your doctor. Stopping this medicine too quickly may cause serious side effects or your condition may worsen.  A special MedGuide will be given to you by the pharmacist with each prescription and refill. Be sure to read this information carefully each time.  Talk to your pediatrician regarding the use of this medicine in children. Special care may be needed.  Overdosage: If you think you have taken too much of this medicine contact a poison control center or emergency room at once.  NOTE: This medicine is only for you. Do not share this medicine with others.  What if I miss a dose?  If you miss a dose, take it as soon as you can. If it is almost time for your next dose, take only that dose. Do not take double or extra doses.  What may interact with this medicine?  Do not take this medicine with any of the following medications:  -certain medicines for fungal infections like  fluconazole, itraconazole, ketoconazole, posaconazole, voriconazole  -cisapride  -dofetilide  -dronedarone  -linezolid  -MAOIs like Carbex, Eldepryl, Marplan, Nardil, and Parnate  -mesoridazine  -methylene blue (injected into a vein)  -pimozide  -saquinavir  -thioridazine  -ziprasidone  This medicine may also interact with the following medications:  -alcohol  -antiviral medicines for HIV or AIDS  -aspirin and aspirin-like medicines  -barbiturates like phenobarbital  -certain medicines for blood pressure, heart disease, irregular heart beat  -certain medicines for depression, anxiety, or psychotic disturbances  -certain medicines for migraine headache like almotriptan, eletriptan, frovatriptan, naratriptan, rizatriptan, sumatriptan, zolmitriptan  -certain medicines for seizures like carbamazepine and phenytoin  -certain medicines for sleep  -certain medicines that treat or prevent blood clots like dalteparin, enoxaparin, warfarin  -digoxin  -fentanyl  -lithium  -NSAIDS, medicines for pain and inflammation, like ibuprofen or naproxen  -other medicines that prolong the QT interval (cause an abnormal heart rhythm)  -rasagiline  -supplements like St. John's wort, kava kava, valerian  -tramadol  -tryptophan  This list may not describe all possible interactions. Give your health care provider a list of all the medicines, herbs, non-prescription drugs, or dietary supplements you use. Also tell them if you smoke, drink alcohol, or use illegal drugs. Some items may interact with your medicine.  What should I watch for while using this medicine?  Tell your doctor if your symptoms do not get better or if they get worse.   Visit your doctor or health care professional for regular checks on your progress. Because it may take several weeks to see the full effects of this medicine, it is important to continue your treatment as prescribed by your doctor.  Patients and their families should watch out for new or worsening thoughts of  suicide or depression. Also watch out for sudden changes in feelings such as feeling anxious, agitated, panicky, irritable, hostile, aggressive, impulsive, severely restless, overly excited and hyperactive, or not being able to sleep. If this happens, especially at the beginning of treatment or after a change in dose, call your health care professional.  You may get drowsy or dizzy. Do not drive, use machinery, or do anything that needs mental alertness until you know how this medicine affects you. Do not stand or sit up quickly, especially if you are an older patient. This reduces the risk of dizzy or fainting spells. Alcohol may interfere with the effect of this medicine. Avoid alcoholic drinks.  This medicine may cause dry eyes and blurred vision. If you wear contact lenses you may feel some discomfort. Lubricating drops may help. See your eye doctor if the problem does not go away or is severe.  Your mouth may get dry. Chewing sugarless gum, sucking hard candy and drinking plenty of water may help. Contact your doctor if the problem does not go away or is severe.  What side effects may I notice from receiving this medicine?  Side effects that you should report to your doctor or health care professional as soon as possible:  -allergic reactions like skin rash, itching or hives, swelling of the face, lips, or tongue  -fast, irregular heartbeat  -feeling faint or lightheaded, falls  -painful erections or other sexual dysfunction  -suicidal thoughts or other mood changes  -trembling  Side effects that usually do not require medical attention (report to your doctor or health care professional if they continue or are bothersome):  -constipation  -headache  -muscle aches or pains  -nausea, vomiting  -unusually weak or tired  This list may not describe all possible side effects. Call your doctor for medical advice about side effects. You may report side effects to FDA at 1-800-FDA-1088.  Where should I keep my  medicine?  Keep out of the reach of children.  Store at room temperature between 15 and 30 degrees C (59 to 86 degrees F). Protect from light. Keep container tightly closed. Throw away any unused medicine after the expiration date.  NOTE: This sheet is a summary. It may not cover all possible information. If you have questions about this medicine, talk to your doctor, pharmacist, or health care provider.     © 2016, [employer]. (2013-03-07 15:46:28)

## 2016-01-04 LAB — CBC WITH DIFFERENTIAL/PLATELET
BASOS ABS: 84 {cells}/uL (ref 0–200)
Basophils Relative: 1 %
EOS ABS: 336 {cells}/uL (ref 15–500)
EOS PCT: 4 %
HCT: 41.3 % (ref 38.5–50.0)
HEMOGLOBIN: 13.9 g/dL (ref 13.2–17.1)
Lymphocytes Relative: 37 %
Lymphs Abs: 3108 cells/uL (ref 850–3900)
MCH: 28.8 pg (ref 27.0–33.0)
MCHC: 33.7 g/dL (ref 32.0–36.0)
MCV: 85.7 fL (ref 80.0–100.0)
MONOS PCT: 9 %
MPV: 10.1 fL (ref 7.5–12.5)
Monocytes Absolute: 756 cells/uL (ref 200–950)
NEUTROS PCT: 49 %
Neutro Abs: 4116 cells/uL (ref 1500–7800)
PLATELETS: 135 10*3/uL — AB (ref 140–400)
RBC: 4.82 MIL/uL (ref 4.20–5.80)
RDW: 13.9 % (ref 11.0–15.0)
WBC: 8.4 10*3/uL (ref 3.8–10.8)

## 2016-01-04 LAB — TSH: TSH: 1.43 m[IU]/L (ref 0.40–4.50)

## 2016-01-04 LAB — VITAMIN B12: Vitamin B-12: 449 pg/mL (ref 200–1100)

## 2016-01-04 LAB — LIPID PANEL
CHOL/HDL RATIO: 7.3 ratio — AB (ref <=5.0)
Cholesterol: 169 mg/dL (ref 125–200)
HDL: 23 mg/dL — ABNORMAL LOW (ref >=40)
LDL Cholesterol: 97 mg/dL (ref <130)
Triglycerides: 247 mg/dL — ABNORMAL HIGH (ref <150)
VLDL: 49 mg/dL — ABNORMAL HIGH (ref <30)

## 2016-01-04 LAB — BASIC METABOLIC PANEL WITH GFR
BUN: 10 mg/dL (ref 7–25)
CALCIUM: 9.1 mg/dL (ref 8.6–10.3)
CHLORIDE: 100 mmol/L (ref 98–110)
CO2: 22 mmol/L (ref 20–31)
CREATININE: 1.02 mg/dL (ref 0.70–1.33)
GFR, Est African American: >89 mL/min (ref >=60)
GFR, Est Non African American: 82 mL/min (ref >=60)
Glucose, Bld: 69 mg/dL (ref 65–99)
Potassium: 4.2 mmol/L (ref 3.5–5.3)
SODIUM: 136 mmol/L (ref 135–146)

## 2016-01-04 LAB — HEMOGLOBIN A1C
HEMOGLOBIN A1C: 5.4 % (ref <5.7)
MEAN PLASMA GLUCOSE: 108 mg/dL

## 2016-01-04 LAB — HEPATIC FUNCTION PANEL
ALBUMIN: 4.1 g/dL (ref 3.6–5.1)
ALT: 14 U/L (ref 9–46)
AST: 16 U/L (ref 10–35)
Alkaline Phosphatase: 88 U/L (ref 40–115)
BILIRUBIN DIRECT: 0.1 mg/dL (ref <=0.2)
Indirect Bilirubin: 0.4 mg/dL (ref 0.2–1.2)
Total Bilirubin: 0.5 mg/dL (ref 0.2–1.2)
Total Protein: 6.6 g/dL (ref 6.1–8.1)

## 2016-01-04 LAB — IRON AND TIBC
%SAT: 24 % (ref 15–60)
Iron: 78 ug/dL (ref 50–180)
TIBC: 330 ug/dL (ref 250–425)
UIBC: 252 ug/dL (ref 125–400)

## 2016-03-29 ENCOUNTER — Other Ambulatory Visit: Payer: Self-pay | Admitting: Internal Medicine

## 2016-04-06 ENCOUNTER — Other Ambulatory Visit: Payer: Self-pay | Admitting: Internal Medicine

## 2016-04-06 DIAGNOSIS — F419 Anxiety disorder, unspecified: Secondary | ICD-10-CM

## 2016-04-08 ENCOUNTER — Other Ambulatory Visit: Payer: Self-pay | Admitting: Internal Medicine

## 2016-04-08 DIAGNOSIS — F419 Anxiety disorder, unspecified: Secondary | ICD-10-CM

## 2016-04-10 ENCOUNTER — Ambulatory Visit (INDEPENDENT_AMBULATORY_CARE_PROVIDER_SITE_OTHER): Payer: BLUE CROSS/BLUE SHIELD | Admitting: Physician Assistant

## 2016-04-10 ENCOUNTER — Encounter: Payer: Self-pay | Admitting: Physician Assistant

## 2016-04-10 VITALS — BP 128/74 | HR 65 | Temp 97.5°F | Resp 16 | Ht 69.75 in | Wt 220.8 lb

## 2016-04-10 DIAGNOSIS — I10 Essential (primary) hypertension: Secondary | ICD-10-CM

## 2016-04-10 DIAGNOSIS — E559 Vitamin D deficiency, unspecified: Secondary | ICD-10-CM | POA: Diagnosis not present

## 2016-04-10 DIAGNOSIS — E785 Hyperlipidemia, unspecified: Secondary | ICD-10-CM

## 2016-04-10 DIAGNOSIS — Z79899 Other long term (current) drug therapy: Secondary | ICD-10-CM | POA: Diagnosis not present

## 2016-04-10 DIAGNOSIS — R7303 Prediabetes: Secondary | ICD-10-CM

## 2016-04-10 DIAGNOSIS — H8111 Benign paroxysmal vertigo, right ear: Secondary | ICD-10-CM

## 2016-04-10 LAB — CBC WITH DIFFERENTIAL/PLATELET
BASOS ABS: 96 {cells}/uL (ref 0–200)
Basophils Relative: 1 %
EOS ABS: 288 {cells}/uL (ref 15–500)
EOS PCT: 3 %
HEMATOCRIT: 43.3 % (ref 38.5–50.0)
HEMOGLOBIN: 15.1 g/dL (ref 13.2–17.1)
LYMPHS ABS: 2976 {cells}/uL (ref 850–3900)
Lymphocytes Relative: 31 %
MCH: 30.5 pg (ref 27.0–33.0)
MCHC: 34.9 g/dL (ref 32.0–36.0)
MCV: 87.5 fL (ref 80.0–100.0)
MONO ABS: 672 {cells}/uL (ref 200–950)
MPV: 10.1 fL (ref 7.5–12.5)
Monocytes Relative: 7 %
NEUTROS ABS: 5568 {cells}/uL (ref 1500–7800)
Neutrophils Relative %: 58 %
Platelets: 131 10*3/uL — ABNORMAL LOW (ref 140–400)
RBC: 4.95 MIL/uL (ref 4.20–5.80)
RDW: 13.1 % (ref 11.0–15.0)
WBC: 9.6 10*3/uL (ref 3.8–10.8)

## 2016-04-10 LAB — BASIC METABOLIC PANEL WITH GFR
BUN: 15 mg/dL (ref 7–25)
CALCIUM: 9.7 mg/dL (ref 8.6–10.3)
CO2: 25 mmol/L (ref 20–31)
Chloride: 104 mmol/L (ref 98–110)
Creat: 1 mg/dL (ref 0.70–1.33)
GFR, EST NON AFRICAN AMERICAN: 84 mL/min (ref >=60)
GLUCOSE: 89 mg/dL (ref 65–99)
Potassium: 4.7 mmol/L (ref 3.5–5.3)
Sodium: 137 mmol/L (ref 135–146)

## 2016-04-10 LAB — TSH: TSH: 2.88 mIU/L (ref 0.40–4.50)

## 2016-04-10 LAB — HEPATIC FUNCTION PANEL
ALBUMIN: 4.5 g/dL (ref 3.6–5.1)
ALT: 17 U/L (ref 9–46)
AST: 18 U/L (ref 10–35)
Alkaline Phosphatase: 95 U/L (ref 40–115)
BILIRUBIN INDIRECT: 0.4 mg/dL (ref 0.2–1.2)
Bilirubin, Direct: 0.1 mg/dL (ref <=0.2)
TOTAL PROTEIN: 7.4 g/dL (ref 6.1–8.1)
Total Bilirubin: 0.5 mg/dL (ref 0.2–1.2)

## 2016-04-10 LAB — LIPID PANEL
CHOLESTEROL: 195 mg/dL (ref 125–200)
HDL: 29 mg/dL — ABNORMAL LOW (ref >=40)
LDL Cholesterol: 115 mg/dL (ref <130)
TRIGLYCERIDES: 254 mg/dL — AB (ref <150)
Total CHOL/HDL Ratio: 6.7 Ratio — ABNORMAL HIGH (ref <=5.0)
VLDL: 51 mg/dL — AB (ref <30)

## 2016-04-10 LAB — MAGNESIUM: MAGNESIUM: 2.3 mg/dL (ref 1.5–2.5)

## 2016-04-10 MED ORDER — AMITRIPTYLINE HCL 50 MG PO TABS: ORAL_TABLET | ORAL | 0 refills | Status: DC
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
## 2016-04-10 NOTE — Patient Instructions (Signed)
Will treat insomnia with amitriptyline to possibly help with vertigo/dizziness as well Take 50mg  at night, can do up to 3 at night Follow up eye doctor Continue low dose aspirin Will get US of carotids  Get on over the counter flonase and over the counter allergy pill read below  Your ears and sinuses are connected by the eustachian tube. When your sinuses are inflamed, this can close off the tube and cause fluid to collect in your middle ear. This can then cause dizziness, popping, clicking, ringing, and echoing in your ears. This is often NOT an infection and does NOT require antibiotics, it is caused by inflammation so the treatments help the inflammation. This can take a long time to get better so please be patient.  Here are things you can do to help with this: - Try the Flonase or Nasonex. Remember to spray each nostril twice towards the outer part of your eye.  Do not sniff but instead pinch your nose and tilt your head back to help the medicine get into your sinuses.  The best time to do this is at bedtime.Stop if you get blurred vision or nose bleeds.  -While drinking fluids, pinch and hold nose close and swallow, to help open eustachian tubes to drain fluid behind ear drums. -Please pick one of the over the counter allergy medications below and take it once daily for allergies.  It will also help with fluid behind ear drums. Claritin or loratadine cheapest but likely the weakest  Zyrtec or certizine at night because it can make you sleepy The strongest is allegra or fexafinadine  Cheapest at [employer], [employer], [employer] -can use decongestant over the counter, please do not use if you have high blood pressure or certain heart conditions.   if worsening HA, changes vision/speech, imbalance, weakness go to the ER

## 2016-04-11 ENCOUNTER — Encounter: Payer: Self-pay | Admitting: Physician Assistant

## 2016-04-11 DIAGNOSIS — D696 Thrombocytopenia, unspecified: Secondary | ICD-10-CM | POA: Insufficient documentation

## 2016-04-11 LAB — VITAMIN D 25 HYDROXY (VIT D DEFICIENCY, FRACTURES): VIT D 25 HYDROXY: 81 ng/mL (ref 30–100)

## 2016-04-11 LAB — HEMOGLOBIN A1C
Hgb A1c MFr Bld: 5.2 % (ref <5.7)
Mean Plasma Glucose: 103 mg/dL

## 2016-05-14 ENCOUNTER — Ambulatory Visit (INDEPENDENT_AMBULATORY_CARE_PROVIDER_SITE_OTHER): Payer: BLUE CROSS/BLUE SHIELD | Admitting: Physician Assistant

## 2016-05-14 ENCOUNTER — Encounter: Payer: Self-pay | Admitting: Physician Assistant

## 2016-05-14 VITALS — BP 110/80 | HR 86 | Temp 98.6°F | Resp 16 | Wt 216.4 lb

## 2016-05-14 DIAGNOSIS — H8111 Benign paroxysmal vertigo, right ear: Secondary | ICD-10-CM | POA: Diagnosis not present

## 2016-05-14 DIAGNOSIS — H9311 Tinnitus, right ear: Secondary | ICD-10-CM

## 2016-05-14 DIAGNOSIS — H9191 Unspecified hearing loss, right ear: Secondary | ICD-10-CM | POA: Diagnosis not present

## 2016-05-14 MED ORDER — AMITRIPTYLINE HCL 25 MG PO TABS: ORAL_TABLET | ORAL | 1 refills | Status: DC

## 2016-05-14 NOTE — Patient Instructions (Signed)
Follow up with an eye doctor We are going to get a CT head to look for something call acoustic neuroma, mass, stroke.   Vertigo Vertigo means you feel like you or your surroundings are moving when they are not. Vertigo can be dangerous if it occurs when you are at work, driving, or performing difficult activities.  CAUSES  Vertigo occurs when there is a conflict of signals sent to your brain from the visual and sensory systems in your body. There are many different causes of vertigo, including:  Infections, especially in the inner ear.  A bad reaction to a drug or misuse of alcohol and medicines.  Withdrawal from drugs or alcohol.  Rapidly changing positions, such as lying down or rolling over in bed.  A migraine headache.  Decreased blood flow to the brain.  Increased pressure in the brain from a head injury, infection, tumor, or bleeding. SYMPTOMS  You may feel as though the world is spinning around or you are falling to the ground. Because your balance is upset, vertigo can cause nausea and vomiting. You may have involuntary eye movements (nystagmus). DIAGNOSIS  Vertigo is usually diagnosed by physical exam. If the cause of your vertigo is unknown, your caregiver may perform imaging tests, such as an MRI scan (magnetic resonance imaging). TREATMENT  Most cases of vertigo resolve on their own, without treatment. Depending on the cause, your caregiver may prescribe certain medicines. If your vertigo is related to body position issues, your caregiver may recommend movements or procedures to correct the problem. In rare cases, if your vertigo is caused by certain inner ear problems, you may need surgery. HOME CARE INSTRUCTIONS   Follow your caregiver's instructions.  Avoid driving.  Avoid operating heavy machinery.  Avoid performing any tasks that would be dangerous to you or others during a vertigo episode.  Tell your caregiver if you notice that certain medicines seem to be  causing your vertigo. Some of the medicines used to treat vertigo episodes can actually make them worse in some people. SEEK IMMEDIATE MEDICAL CARE IF:   Your medicines do not relieve your vertigo or are making it worse.  You develop problems with talking, walking, weakness, or using your arms, hands, or legs.  You develop severe headaches.  Your nausea or vomiting continues or gets worse.  You develop visual changes.  A family member notices behavioral changes.  Your condition gets worse. MAKE SURE YOU:  Understand these instructions.  Will watch your condition.  Will get help right away if you are not doing well or get worse.   This information is not intended to replace advice given to you by your health care provider. Make sure you discuss any questions you have with your health care provider.   Document Released: 05/14/2005 Document Revised: 10/27/2011 Document Reviewed: 11/27/2014 Elsevier Interactive Patient Education [employer]2016 [employer]
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
## 2016-05-15 ENCOUNTER — Other Ambulatory Visit: Payer: Self-pay | Admitting: Physician Assistant

## 2016-05-15 DIAGNOSIS — H8111 Benign paroxysmal vertigo, right ear: Secondary | ICD-10-CM

## 2016-06-10 ENCOUNTER — Ambulatory Visit (INDEPENDENT_AMBULATORY_CARE_PROVIDER_SITE_OTHER): Payer: BLUE CROSS/BLUE SHIELD | Admitting: Neurology

## 2016-06-10 ENCOUNTER — Encounter: Payer: Self-pay | Admitting: Neurology

## 2016-06-10 ENCOUNTER — Telehealth: Payer: Self-pay | Admitting: Neurology

## 2016-06-10 VITALS — BP 129/75 | HR 61 | Ht 69.75 in | Wt 218.2 lb

## 2016-06-10 DIAGNOSIS — R519 Headache, unspecified: Secondary | ICD-10-CM

## 2016-06-10 DIAGNOSIS — R51 Headache: Secondary | ICD-10-CM

## 2016-06-10 DIAGNOSIS — H905 Unspecified sensorineural hearing loss: Secondary | ICD-10-CM | POA: Diagnosis not present

## 2016-06-10 DIAGNOSIS — G450 Vertebro-basilar artery syndrome: Secondary | ICD-10-CM | POA: Diagnosis not present

## 2016-06-10 DIAGNOSIS — R42 Dizziness and giddiness: Secondary | ICD-10-CM | POA: Diagnosis not present

## 2016-06-10 DIAGNOSIS — H9190 Unspecified hearing loss, unspecified ear: Secondary | ICD-10-CM

## 2016-06-10 DIAGNOSIS — H539 Unspecified visual disturbance: Secondary | ICD-10-CM

## 2016-06-10 NOTE — Telephone Encounter (Signed)
Danielle, we can just go forward with the MRI of the brain for now. If I don;t do the p2p will the mri of the brain get denies too like what happened int he past with the other patient? thanks
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
## 2016-06-10 NOTE — Patient Instructions (Addendum)
Remember to drink plenty of fluid, eat healthy meals and do not skip any meals. Try to eat protein with a every meal and eat a healthy snack such as fruit or nuts in between meals. Try to keep a regular sleep-wake schedule and try to exercise daily, particularly in the form of walking, 20-30 minutes a day, if you can.  suggest  As far as diagnostic testing: MRI of the brain, ENT (Dr. Haroldine Lawsrossley), vestibular therapy   Our phone number is [phone]. We also have an after hours call service for urgent matters and there is a physician on-call for urgent questions. For any emergencies you know to call 911 or go to the nearest emergency room

## 2016-06-10 NOTE — Telephone Encounter (Signed)
Patients MRI Brain w/wo was approved but the MRA was denied. It is requiring P2P, the phone number is [phone]. Please call within the next 2 business days. BCBS ID [id number].

## 2016-06-12 NOTE — Telephone Encounter (Signed)
I called and requested to withdraw the MRA. They stated that if the patient receives the MRI and you reviews those results and decide to proceed with MRA we can initiate a new case. MRI Brain Auth- [id number] (07/09/16).

## 2016-06-16 NOTE — Telephone Encounter (Signed)
[facility]

## 2016-06-19 ENCOUNTER — Other Ambulatory Visit: Payer: Self-pay | Admitting: Neurology

## 2016-06-19 ENCOUNTER — Ambulatory Visit
Admission: RE | Admit: 2016-06-19 | Discharge: 2016-06-19 | Disposition: A | Discharge status: Home / Self Care | Payer: BLUE CROSS/BLUE SHIELD | Source: Ambulatory Visit | Attending: Neurology | Admitting: Neurology

## 2016-06-19 DIAGNOSIS — T7589XA Other specified effects of external causes, initial encounter: Secondary | ICD-10-CM

## 2016-06-24 ENCOUNTER — Ambulatory Visit: Payer: Self-pay

## 2016-06-25 ENCOUNTER — Ambulatory Visit (INDEPENDENT_AMBULATORY_CARE_PROVIDER_SITE_OTHER): Payer: BLUE CROSS/BLUE SHIELD

## 2016-06-25 DIAGNOSIS — H539 Unspecified visual disturbance: Secondary | ICD-10-CM | POA: Diagnosis not present

## 2016-06-25 DIAGNOSIS — R42 Dizziness and giddiness: Secondary | ICD-10-CM

## 2016-06-25 DIAGNOSIS — R51 Headache: Secondary | ICD-10-CM

## 2016-06-25 DIAGNOSIS — R519 Headache, unspecified: Secondary | ICD-10-CM

## 2016-06-26 MED ORDER — GADOPENTETATE DIMEGLUMINE 469.01 MG/ML IV SOLN: 20.0000 mL | Freq: Once | INTRAVENOUS | Status: DC | PRN

## 2016-07-01 ENCOUNTER — Other Ambulatory Visit: Payer: Self-pay | Admitting: Neurology

## 2016-07-01 ENCOUNTER — Telehealth: Payer: Self-pay | Admitting: Neurology

## 2016-07-01 DIAGNOSIS — G950 Syringomyelia and syringobulbia: Secondary | ICD-10-CM

## 2016-07-01 DIAGNOSIS — G935 Compression of brain: Secondary | ICD-10-CM

## 2016-07-01 NOTE — Telephone Encounter (Signed)
Spoke to patient regarding MRI of hte brain. 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 No other abnormalities are noted in the brain

## 2016-07-27 ENCOUNTER — Other Ambulatory Visit: Payer: Self-pay | Admitting: Internal Medicine

## 2016-07-27 DIAGNOSIS — F419 Anxiety disorder, unspecified: Secondary | ICD-10-CM

## 2016-07-27 NOTE — Telephone Encounter (Signed)
Please call alprazolam 

## 2016-08-07 ENCOUNTER — Ambulatory Visit (INDEPENDENT_AMBULATORY_CARE_PROVIDER_SITE_OTHER): Payer: BLUE CROSS/BLUE SHIELD | Admitting: Internal Medicine

## 2016-08-07 ENCOUNTER — Encounter: Payer: Self-pay | Admitting: Internal Medicine

## 2016-08-07 VITALS — BP 116/80 | HR 72 | Temp 97.2°F | Resp 16 | Ht 69.5 in | Wt 212.0 lb

## 2016-08-07 DIAGNOSIS — E782 Mixed hyperlipidemia: Secondary | ICD-10-CM

## 2016-08-07 DIAGNOSIS — I10 Essential (primary) hypertension: Secondary | ICD-10-CM | POA: Diagnosis not present

## 2016-08-07 DIAGNOSIS — Z111 Encounter for screening for respiratory tuberculosis: Secondary | ICD-10-CM

## 2016-08-07 DIAGNOSIS — R7303 Prediabetes: Secondary | ICD-10-CM

## 2016-08-07 DIAGNOSIS — Z79899 Other long term (current) drug therapy: Secondary | ICD-10-CM

## 2016-08-07 DIAGNOSIS — E559 Vitamin D deficiency, unspecified: Secondary | ICD-10-CM

## 2016-08-07 DIAGNOSIS — K2100 Gastro-esophageal reflux disease with esophagitis, without bleeding: Secondary | ICD-10-CM

## 2016-08-07 DIAGNOSIS — Z136 Encounter for screening for cardiovascular disorders: Secondary | ICD-10-CM

## 2016-08-07 DIAGNOSIS — Z1212 Encounter for screening for malignant neoplasm of rectum: Secondary | ICD-10-CM

## 2016-08-07 DIAGNOSIS — Z23 Encounter for immunization: Secondary | ICD-10-CM

## 2016-08-07 DIAGNOSIS — Z0000 Encounter for general adult medical examination without abnormal findings: Secondary | ICD-10-CM

## 2016-08-07 DIAGNOSIS — R5383 Other fatigue: Secondary | ICD-10-CM

## 2016-08-07 DIAGNOSIS — N4340 Spermatocele of epididymis, unspecified: Secondary | ICD-10-CM

## 2016-08-07 DIAGNOSIS — Z0001 Encounter for general adult medical examination with abnormal findings: Secondary | ICD-10-CM

## 2016-08-07 DIAGNOSIS — Z125 Encounter for screening for malignant neoplasm of prostate: Secondary | ICD-10-CM | POA: Diagnosis not present

## 2016-08-07 LAB — CBC WITH DIFFERENTIAL/PLATELET
BASOS ABS: 0 {cells}/uL (ref 0–200)
Basophils Relative: 0 %
EOS ABS: 164 {cells}/uL (ref 15–500)
EOS PCT: 2 %
HEMATOCRIT: 41 % (ref 38.5–50.0)
Hemoglobin: 13.8 g/dL (ref 13.2–17.1)
LYMPHS ABS: 2542 {cells}/uL (ref 850–3900)
Lymphocytes Relative: 31 %
MCH: 29.6 pg (ref 27.0–33.0)
MCHC: 33.7 g/dL (ref 32.0–36.0)
MCV: 87.8 fL (ref 80.0–100.0)
MONO ABS: 410 {cells}/uL (ref 200–950)
MPV: 9.7 fL (ref 7.5–12.5)
Monocytes Relative: 5 %
NEUTROS ABS: 5084 {cells}/uL (ref 1500–7800)
Neutrophils Relative %: 62 %
Platelets: 138 10*3/uL — ABNORMAL LOW (ref 140–400)
RBC: 4.67 MIL/uL (ref 4.20–5.80)
RDW: 13.4 % (ref 11.0–15.0)
WBC: 8.2 10*3/uL (ref 3.8–10.8)

## 2016-08-07 LAB — HEMOGLOBIN A1C
HEMOGLOBIN A1C: 5.1 % (ref <5.7)
MEAN PLASMA GLUCOSE: 100 mg/dL

## 2016-08-07 LAB — TSH: TSH: 1.72 mIU/L (ref 0.40–4.50)

## 2016-08-07 LAB — VITAMIN B12: VITAMIN B 12: 451 pg/mL (ref 200–1100)

## 2016-08-07 LAB — PSA: PSA: 0.2 ng/mL (ref <=4.0)

## 2016-08-07 NOTE — Patient Instructions (Signed)

## 2016-08-07 NOTE — Progress Notes (Signed)
[facility]   Lucky CowboyWilliam Alannah Averhart, M.D.    Dyanne CarrelAmanda R. Steffanie Dunnollier, P.A.-C      Terri Piedraourtney Forcucci, P.A.-C  [facility]                [address]                [place], [place] [id number] Telephone [phone] Telefax [phone] Annual  Screening/Preventative Visit  & Comprehensive Evaluation & Examination     This very nice 55 y.o. [name] presents for a Screening/Preventative Visit & comprehensive evaluation and management of multiple medical co-morbidities.  Patient has been followed for labile HTN, Prediabetes, Hyperlipidemia, GERD and Vitamin D Deficiency. Patent's GERD is controlled with meds. Patient has OSA and uses his CPAP sporadically.      Patient has seen Dr Cordella RegisterBen Herrick in the past for a Left Spermatocele that he feels has gotten larger and desires to have re-evaluated.      Patient has hx/o labile HTN monitored expectantly since 2013. Patient's BP has been controlled at home.  Today's BP is at goal - 116/80. Patient denies any cardiac symptoms as chest pain, palpitations, shortness of breath, dizziness or ankle swelling.     Patient's hyperlipidemia is controlled with diet and medications. Patient denies myalgias or other medication SE's. Last lipids were at goal albeit elevated Trig's:  Lab Results  Component Value Date   CHOL 195 04/10/2016   HDL 29 (L) 04/10/2016   LDLCALC 115 04/10/2016   TRIG 254 (H) 04/10/2016   CHOLHDL 6.7 (H) 04/10/2016      Patient has prediabetes circa 2012 with A1c 5.7%and normalized to 5.6% in 2014 and patient denies reactive hypoglycemic symptoms, visual blurring, diabetic polys or paresthesias. Last A1c was Normal and at goal:  Lab Results  Component Value Date   HGBA1C 5.2 04/10/2016       Finally, patient has history of Vitamin D Deficiency in 2011 of "20" and last vitamin D was at goal: Lab Results  Component Value Date   VD25OH 9181 04/10/2016   Current Outpatient  Prescriptions on File Prior to Visit  Medication Sig  . ALPRAZolam (XANAX) 1 MG tablet TAKE 1/2-1 TABLET BY MOUTH 3 TIMES DAILY AS NEEDED FOR ANXIETY  . amitriptyline (ELAVIL) 25 MG tablet 1/2-1 pill at night for sleep, nerve pain, and dizziness  . Cholecalciferol (VITAMIN D PO) Take 10,000 Int'l Units by mouth daily.   . Flaxseed, Linseed, (FLAXSEED OIL) 1200 MG CAPS Take 1 capsule by mouth daily.   . Magnesium 500 MG TABS Take by mouth daily.  . Omega-3 Fatty Acids (FISH OIL) 1200 MG CAPS Take 1 capsule by mouth daily.   Marland Kitchen. omeprazole (PRILOSEC) 40 MG capsule TAKE 1 CAPSULE BY MOUTH DAILY   Current Facility-Administered Medications on File Prior to Visit  Medication  . gadopentetate dimeglumine (MAGNEVIST) injection 20 mL   Allergies  Allergen Reactions  . Niacin And Related Swelling    Red rash and swelling  . Gemfibrozil   . Lipitor [Atorvastatin]   . Penicillins Other (See Comments)    Unknown childhood allergy Received 2 Gms Ancef with no obvious reaction   Past Medical History:  Diagnosis Date  . Acid reflux   . Arthritis   . Blood dyscrasia    ITP  . Bruise 08/08/11   pt states brusing x 1 month  . Chest pain 08/08/2011   "a little in last month"  . Disorder of both ears  08/08/2011   blood in ears in past month - put on steroids, "tubes stopped up"  . Fracture of left clavicle 02/08/2015  . Hypercholesteremia   . Hypertension, Labile 11/03/2013   no meds  . Idiopathic thrombocytopenic purpura (ITP)   . Leg cramps 08/08/2011   bad in last 1.5 months  . Morbid obesity (HCC)   . Prediabetes   . Sleep apnea    wears CPAP "sometimes"  . Tick bites 08/08/11   60 to 70 ticks in last hunting season , last > 1 month   Health Maintenance  Topic Date Due  . INFLUENZA VACCINE  03/18/2016  . COLONOSCOPY  08/09/2024  . TETANUS/TDAP  02/03/2025  . Hepatitis C Screening  Completed  . HIV Screening  Completed   Immunization History  Administered Date(s) Administered  .  Influenza Split 05/24/2013, 06/22/2014  . Influenza, Seasonal, Injecte, Preservative Fre 07/03/2015  . Influenza,inj,quad, With Preservative 08/07/2016  . PPD Test 06/22/2014, 07/03/2015, 08/07/2016  . Pneumococcal Polysaccharide-23 05/19/2012  . Td 08/18/2008, 02/04/2015   Past Surgical History:  Procedure Laterality Date  . BACK SURGERY  07/2010   lumbar - 3 discs  . FRACTURE SURGERY     right foot fracture, no surgery  . LUMBAR DISC SURGERY  2011  . ORIF CLAVICULAR FRACTURE Left 02/08/2015   Procedure: OPEN REDUCTION INTERNAL FIXATION (ORIF) LEFT CLAVICULAR FRACTURE;  Surgeon: Teryl Lucy, MD;  Location: [facility];  Service: Orthopedics;  Laterality: Left;   Family History  Problem Relation Age of Onset  . Uterine cancer Mother   . Uterine cancer Sister   . Stroke Neg Hx    Social History   Social History  . Marital status: Single    Spouse name: N/A  . Number of children: 0  . Years of education: 10   Occupational History  . [employer]     Social History Main Topics  . Smoking status: Former Smoker    Packs/day: 1.00    Years: 8.00    Types: Cigarettes    Quit date: 01/03/2015  . Smokeless tobacco: Current User    Types: Chew     Comment: smokes 1-2 cigarettes daily  . Alcohol use 0.0 oz/week     Comment: drank 12 beers last night on 08/07/11, pt states rarely drinks  . Drug use: No     Comment: occasionally chews tobacco  . Sexual activity: Yes   Other Topics Concern  . Not on file   Social History Narrative   Lives alone   Caffeine use: Coffee daily   Decaf tea    ROS Constitutional: Denies fever, chills, weight loss/gain, headaches, insomnia,  night sweats or change in appetite. Does c/o fatigue. Eyes: Denies redness, blurred vision, diplopia, discharge, itchy or watery eyes.  ENT: Denies discharge, congestion, post nasal drip, epistaxis, sore throat, earache, hearing loss, dental pain, Tinnitus, Vertigo, Sinus pain or snoring.   Cardio: Denies chest pain, palpitations, irregular heartbeat, syncope, dyspnea, diaphoresis, orthopnea, PND, claudication or edema Respiratory: denies cough, dyspnea, DOE, pleurisy, hoarseness, laryngitis or wheezing.  Gastrointestinal: Denies dysphagia, heartburn, reflux, water brash, pain, cramps, nausea, vomiting, bloating, diarrhea, constipation, hematemesis, melena, hematochezia, jaundice or hemorrhoids Genitourinary: Denies dysuria, frequency, urgency, nocturia, hesitancy, discharge, hematuria or flank pain Musculoskeletal: Denies arthralgia, myalgia, stiffness, Jt. Swelling, pain, limp or strain/sprain. Denies Falls. Skin: Denies puritis, rash, hives, warts, acne, eczema or change in skin lesion Neuro: No weakness, tremor, incoordination, spasms, paresthesia or pain Psychiatric: Denies confusion, memory  loss or sensory loss. Denies Depression. Endocrine: Denies change in weight, skin, hair change, nocturia, and paresthesia, diabetic polys, visual blurring or hyper / hypo glycemic episodes.  Heme/Lymph: No excessive bleeding, bruising or enlarged lymph nodes.  Physical Exam  BP 116/80   Pulse 72   Temp 97.2 F (36.2 C)   Resp 16   Ht 5' 9.5" (1.765 m)   Wt 212 lb (96.2 kg)   BMI 30.86 kg/m   General Appearance: Well nourished, in no apparent distress.  Eyes: PERRLA, EOMs, conjunctiva no swelling or erythema, normal fundi and vessels. Sinuses: No frontal/maxillary tenderness ENT/Mouth: EACs patent / TMs  nl. Nares clear without erythema, swelling, mucoid exudates. Oral hygiene is good. No erythema, swelling, or exudate. Tongue normal, non-obstructing. Tonsils not swollen or erythematous. Hearing normal.  Neck: Supple, thyroid normal. No bruits, nodes or JVD. Respiratory: Respiratory effort normal.  BS equal and clear bilateral without rales, rhonci, wheezing or stridor. Cardio: Heart sounds are normal with regular rate and rhythm and no murmurs, rubs or gallops. Peripheral pulses  are normal and equal bilaterally without edema. No aortic or femoral bruits. Chest: symmetric with normal excursions and percussion.  Abdomen: Soft, with Nl bowel sounds. Nontender, no guarding, rebound, hernias, masses, or organomegaly.  Lymphatics: Non tender without lymphadenopathy.  Genitourinary: No hernias.Testes nl. DRE - prostate nl for age - smooth & firm w/o nodules. Soft Cystic mass of the left testes.  Musculoskeletal: Full ROM all peripheral extremities, joint stability, 5/5 strength, and normal gait. Skin: Warm and dry without rashes, lesions, cyanosis, clubbing or  ecchymosis.  Neuro: Cranial nerves intact, reflexes equal bilaterally. Normal muscle tone, no cerebellar symptoms. Sensation intact.  Pysch: Alert and oriented X 3 with normal affect, insight and judgment appropriate.   Assessment and Plan  1. Annual Preventative/Screening Exam    2. Essential hypertension  - Microalbumin / creatinine urine ratio - EKG 12-Lead - US, RETROPERITNL ABD,  LK[place]D - Urinalysis, Routine w reflex microscopic - CBC with Differential/Platelet - BASIC METABOLIC PANEL WITH GFR - TSH  3. Mixed hyperlipidemia  - EKG 12-Lead - US, RETROPERITNL ABD,  LTD - Hepatic function panel - Lipid panel - TSH  4. Prediabetes  - EKG 12-Lead - US, RETROPERITNL ABD,  LTD - Hemoglobin A1c - Insulin, random  5. Vitamin D deficiency  - VITAMIN D 25 Hydroxy   6. GERD   7. Screening for rectal cancer  - POC Hemoccult Bld/Stl   8. Prostate cancer screening  - PSA  9. Screening for ischemic heart disease  - EKG 12-Lead  10. Screening for AAA (aortic abdominal aneurysm)  - US, RETROPERITNL ABD,  LTD  11. Other fatigue  - Vitamin B12 - Iron and TIBC - Testosterone - CBC with Differential/Platelet  12. Medication management  - Urinalysis, Routine w reflex microscopic - CBC with Differential/Platelet - BASIC METABOLIC PANEL WITH GFR - Hepatic function panel - Magnesium  13.  Screening examination for pulmonary tuberculosis  - PPD  14. Need for prophylactic vaccination and inoculation against influenza  - Flu Vaccine QUAD with preservative  15. Left Spermatocele by hx, increasing in size  - refer back to Dr Marlou PorchHerrick       Continue prudent diet as discussed, weight control, BP monitoring, regular exercise, and medications as discussed.  Discussed med effects and SE's. Routine screening labs and tests as requested with regular follow-up as recommended. Over 40 minutes of exam, counseling, chart review and high complex critical decision making was performed

## 2016-08-08 ENCOUNTER — Other Ambulatory Visit: Payer: Self-pay | Admitting: Internal Medicine

## 2016-08-08 DIAGNOSIS — E782 Mixed hyperlipidemia: Secondary | ICD-10-CM

## 2016-08-08 LAB — BASIC METABOLIC PANEL WITH GFR
BUN: 10 mg/dL (ref 7–25)
CALCIUM: 9 mg/dL (ref 8.6–10.3)
CO2: 24 mmol/L (ref 20–31)
CREATININE: 1.18 mg/dL (ref 0.70–1.33)
Chloride: 102 mmol/L (ref 98–110)
GFR, Est African American: 79 mL/min (ref >=60)
GFR, Est Non African American: 69 mL/min (ref >=60)
GLUCOSE: 89 mg/dL (ref 65–99)
Potassium: 4.4 mmol/L (ref 3.5–5.3)
Sodium: 138 mmol/L (ref 135–146)

## 2016-08-08 LAB — URINALYSIS, ROUTINE W REFLEX MICROSCOPIC
Bilirubin Urine: NEGATIVE
Glucose, UA: NEGATIVE
HGB URINE DIPSTICK: NEGATIVE
Ketones, ur: NEGATIVE
LEUKOCYTES UA: NEGATIVE
NITRITE: NEGATIVE
PROTEIN: NEGATIVE
Specific Gravity, Urine: 1.009 (ref 1.001–1.035)
pH: 6.5 (ref 5.0–8.0)

## 2016-08-08 LAB — MAGNESIUM: MAGNESIUM: 2 mg/dL (ref 1.5–2.5)

## 2016-08-08 LAB — HEPATIC FUNCTION PANEL
ALT: 18 U/L (ref 9–46)
AST: 18 U/L (ref 10–35)
Albumin: 4.3 g/dL (ref 3.6–5.1)
Alkaline Phosphatase: 86 U/L (ref 40–115)
Bilirubin, Direct: 0.1 mg/dL (ref <=0.2)
Indirect Bilirubin: 0.5 mg/dL (ref 0.2–1.2)
TOTAL PROTEIN: 7 g/dL (ref 6.1–8.1)
Total Bilirubin: 0.6 mg/dL (ref 0.2–1.2)

## 2016-08-08 LAB — LIPID PANEL
CHOLESTEROL: 202 mg/dL — AB (ref <200)
HDL: 26 mg/dL — ABNORMAL LOW (ref >40)
LDL Cholesterol: 150 mg/dL — ABNORMAL HIGH (ref <100)
Total CHOL/HDL Ratio: 7.8 Ratio — ABNORMAL HIGH (ref <5.0)
Triglycerides: 132 mg/dL (ref <150)
VLDL: 26 mg/dL (ref <30)

## 2016-08-08 LAB — INSULIN, RANDOM: Insulin: 5.7 u[IU]/mL (ref 2.0–19.6)

## 2016-08-08 LAB — IRON AND TIBC
%SAT: 21 % (ref 15–60)
IRON: 69 ug/dL (ref 50–180)
TIBC: 321 ug/dL (ref 250–425)
UIBC: 252 ug/dL (ref 125–400)

## 2016-08-08 LAB — VITAMIN D 25 HYDROXY (VIT D DEFICIENCY, FRACTURES): VIT D 25 HYDROXY: 87 ng/mL (ref 30–100)

## 2016-08-08 LAB — MICROALBUMIN / CREATININE URINE RATIO
Creatinine, Urine: 67 mg/dL (ref 20–370)
MICROALB UR: 0.2 mg/dL
MICROALB/CREAT RATIO: 3 ug/mg{creat} (ref <30)

## 2016-08-08 LAB — TESTOSTERONE: TESTOSTERONE: 555 ng/dL (ref 250–827)

## 2016-08-08 MED ORDER — ROSUVASTATIN CALCIUM 40 MG PO TABS: ORAL_TABLET | ORAL | 5 refills | Status: DC

## 2016-08-20 ENCOUNTER — Other Ambulatory Visit: Payer: Self-pay

## 2016-08-20 DIAGNOSIS — Z1212 Encounter for screening for malignant neoplasm of rectum: Secondary | ICD-10-CM

## 2016-08-20 LAB — POC HEMOCCULT BLD/STL (HOME/3-CARD/SCREEN)
Card #2 Fecal Occult Blod, POC: NEGATIVE
Card #3 Fecal Occult Blood, POC: NEGATIVE
Fecal Occult Blood, POC: NEGATIVE

## 2016-08-27 ENCOUNTER — Other Ambulatory Visit: Payer: Self-pay | Admitting: Physician Assistant

## 2016-09-27 ENCOUNTER — Other Ambulatory Visit: Payer: Self-pay | Admitting: Internal Medicine

## 2016-10-25 ENCOUNTER — Other Ambulatory Visit: Payer: Self-pay | Admitting: Internal Medicine

## 2016-10-25 DIAGNOSIS — F419 Anxiety disorder, unspecified: Secondary | ICD-10-CM

## 2016-10-25 NOTE — Telephone Encounter (Signed)
Please call Alprazolam 

## 2016-12-01 ENCOUNTER — Other Ambulatory Visit: Payer: Self-pay | Admitting: Internal Medicine

## 2016-12-01 DIAGNOSIS — E782 Mixed hyperlipidemia: Secondary | ICD-10-CM

## 2017-01-30 ENCOUNTER — Other Ambulatory Visit: Payer: Self-pay | Admitting: Internal Medicine

## 2017-01-30 DIAGNOSIS — F419 Anxiety disorder, unspecified: Secondary | ICD-10-CM

## 2017-02-05 ENCOUNTER — Ambulatory Visit: Payer: Self-pay | Admitting: Internal Medicine

## 2017-02-18 ENCOUNTER — Other Ambulatory Visit: Payer: Self-pay | Admitting: Internal Medicine

## 2017-03-05 ENCOUNTER — Encounter: Payer: Self-pay | Admitting: Internal Medicine

## 2017-03-19 ENCOUNTER — Other Ambulatory Visit: Payer: Self-pay | Admitting: Internal Medicine

## 2017-04-02 ENCOUNTER — Other Ambulatory Visit: Payer: Self-pay | Admitting: Internal Medicine

## 2017-04-02 DIAGNOSIS — F419 Anxiety disorder, unspecified: Secondary | ICD-10-CM

## 2017-04-02 MED ORDER — ALPRAZOLAM 1 MG PO TABS: ORAL_TABLET | ORAL | 1 refills | Status: DC

## 2017-04-02 NOTE — Telephone Encounter (Signed)
Please call Alprazolam 

## 2017-05-22 ENCOUNTER — Encounter: Payer: Self-pay | Admitting: Internal Medicine

## 2017-05-27 ENCOUNTER — Other Ambulatory Visit: Payer: Self-pay | Admitting: Internal Medicine

## 2017-05-27 DIAGNOSIS — F419 Anxiety disorder, unspecified: Secondary | ICD-10-CM

## 2017-05-27 NOTE — Telephone Encounter (Signed)
Please call Alpraz  

## 2017-07-01 ENCOUNTER — Other Ambulatory Visit: Payer: Self-pay | Admitting: Internal Medicine

## 2017-07-01 DIAGNOSIS — F419 Anxiety disorder, unspecified: Secondary | ICD-10-CM

## 2017-07-01 NOTE — Telephone Encounter (Signed)
XANAX HAS BEEN CALLED INTO PHARMACY ON NOV 14TH 2018 BY DD

## 2017-07-05 ENCOUNTER — Other Ambulatory Visit: Payer: Self-pay | Admitting: Internal Medicine

## 2017-07-05 DIAGNOSIS — F419 Anxiety disorder, unspecified: Secondary | ICD-10-CM

## 2017-07-06 ENCOUNTER — Other Ambulatory Visit: Payer: Self-pay | Admitting: Internal Medicine

## 2017-07-06 DIAGNOSIS — F419 Anxiety disorder, unspecified: Secondary | ICD-10-CM

## 2017-07-20 ENCOUNTER — Other Ambulatory Visit: Payer: Self-pay | Admitting: Physician Assistant

## 2017-07-20 DIAGNOSIS — E782 Mixed hyperlipidemia: Secondary | ICD-10-CM

## 2017-08-07 ENCOUNTER — Other Ambulatory Visit: Payer: Self-pay | Admitting: Internal Medicine

## 2017-08-07 DIAGNOSIS — F419 Anxiety disorder, unspecified: Secondary | ICD-10-CM

## 2017-09-06 ENCOUNTER — Other Ambulatory Visit: Payer: Self-pay | Admitting: Internal Medicine

## 2017-09-06 DIAGNOSIS — F419 Anxiety disorder, unspecified: Secondary | ICD-10-CM

## 2017-09-09 ENCOUNTER — Ambulatory Visit: Payer: BLUE CROSS/BLUE SHIELD | Admitting: Internal Medicine

## 2017-09-09 ENCOUNTER — Encounter: Payer: Self-pay | Admitting: Internal Medicine

## 2017-09-09 VITALS — BP 140/86 | HR 72 | Temp 97.8°F | Resp 16 | Ht 69.5 in | Wt 216.6 lb

## 2017-09-09 DIAGNOSIS — Z136 Encounter for screening for cardiovascular disorders: Secondary | ICD-10-CM

## 2017-09-09 DIAGNOSIS — E291 Testicular hypofunction: Secondary | ICD-10-CM

## 2017-09-09 DIAGNOSIS — Z1212 Encounter for screening for malignant neoplasm of rectum: Secondary | ICD-10-CM

## 2017-09-09 DIAGNOSIS — Z0001 Encounter for general adult medical examination with abnormal findings: Secondary | ICD-10-CM

## 2017-09-09 DIAGNOSIS — E782 Mixed hyperlipidemia: Secondary | ICD-10-CM

## 2017-09-09 DIAGNOSIS — R5383 Other fatigue: Secondary | ICD-10-CM

## 2017-09-09 DIAGNOSIS — Z87891 Personal history of nicotine dependence: Secondary | ICD-10-CM

## 2017-09-09 DIAGNOSIS — Z111 Encounter for screening for respiratory tuberculosis: Secondary | ICD-10-CM | POA: Diagnosis not present

## 2017-09-09 DIAGNOSIS — Z0000 Encounter for general adult medical examination without abnormal findings: Secondary | ICD-10-CM | POA: Diagnosis not present

## 2017-09-09 DIAGNOSIS — K2100 Gastro-esophageal reflux disease with esophagitis, without bleeding: Secondary | ICD-10-CM

## 2017-09-09 DIAGNOSIS — E559 Vitamin D deficiency, unspecified: Secondary | ICD-10-CM | POA: Diagnosis not present

## 2017-09-09 DIAGNOSIS — Z79899 Other long term (current) drug therapy: Secondary | ICD-10-CM

## 2017-09-09 DIAGNOSIS — I10 Essential (primary) hypertension: Secondary | ICD-10-CM | POA: Diagnosis not present

## 2017-09-09 DIAGNOSIS — Z1211 Encounter for screening for malignant neoplasm of colon: Secondary | ICD-10-CM

## 2017-09-09 DIAGNOSIS — R7303 Prediabetes: Secondary | ICD-10-CM

## 2017-09-09 DIAGNOSIS — Z125 Encounter for screening for malignant neoplasm of prostate: Secondary | ICD-10-CM

## 2017-09-09 NOTE — Patient Instructions (Signed)

## 2017-09-09 NOTE — Progress Notes (Signed)
[facility]   [name], M.D.     Dyanne Carrel. Steffanie Dunn, P.A.-C Judd Gaudier, DNP [facility]                [address]                [place], [place]. [address] Telephone [phone] Telefax [phone] Annual  Screening/Preventative Visit  & Comprehensive Evaluation & Examination     This very nice 57 y.o. [name] presents for a Screening/Preventative Visit & comprehensive evaluation and management of multiple medical co-morbidities.  Patient has been followed for HTN, Prediabetes, Hyperlipidemia and Vitamin D Deficiency. Patient has OSA & uses his CPAP sporadically. Patient's GERD is controlled w/Omeprazole.      Patient has labile HTN  Followed expectantly since 2013. Patient's BP has been controlled at home.  Today's BP ws initially sl elevated and re-checked at goal -140/86. Patient denies any cardiac symptoms as chest pain, palpitations, shortness of breath, dizziness or ankle swelling.     Patient's hyperlipidemia is controlled with diet and medications. Patient denies myalgias or other medication SE's. Last lipids were not at goal: Lab Results  Component Value Date   CHOL 202 (H) 08/07/2016   HDL 26 (L) 08/07/2016   LDLCALC 150 (H) 08/07/2016   TRIG 132 08/07/2016   CHOLHDL 7.8 (H) 08/07/2016      Patient has prediabetes (A1c 5.7%/2012 and then 5.6%/2013) and patient denies reactive hypoglycemic symptoms, visual blurring, diabetic polys or paresthesias. Last A1c was Normal & at goal: Lab Results  Component Value Date   HGBA1C 5.1 08/07/2016       Finally, patient has history of Vitamin D Deficiency ("20"/2011) and last vitamin D was at goal: Lab Results  Component Value Date   VD25OH 87 08/07/2016   Current Outpatient Medications on File Prior to Visit  Medication Sig  . ALPRAZolam (XANAX) 1 MG tablet Take 1/2 TO 1 Tab 2-3 x / Daily ONLY if needed for Anxiety Attack. Please LIMIT to 5 Days or 15  tablets/week to avoid Addiction  . amitriptyline (ELAVIL) 25 MG tablet TAKE 1/2-1 TABLET BY MOUTH AT NIGHT FOR SLEEP, NERVE PAIN, AND DIZZINESS  . Cholecalciferol (VITAMIN D PO) Take 10,000 Int'l Units by mouth daily.   . Flaxseed, Linseed, (FLAXSEED OIL) 1200 MG CAPS Take 1 capsule by mouth daily.   . Omega-3 Fatty Acids (FISH OIL) 1200 MG CAPS Take 1 capsule by mouth daily.   Marland Kitchen omeprazole (PRILOSEC) 40 MG capsule TAKE 1 CAPSULE BY MOUTH DAILY  . rosuvastatin (CRESTOR) 40 MG tablet TAKE 1/2 TO 1 TABLET DAILY OR AS DIRECTED FOR CHOLESTEROL   No current facility-administered medications on file prior to visit.    Allergies  Allergen Reactions  . Niacin And Related Swelling    Red rash and swelling  . Gemfibrozil   . Lipitor [Atorvastatin]   . Penicillins Other (See Comments)    Unknown childhood allergy Received 2 Gms Ancef with no obvious reaction   Past Medical History:  Diagnosis Date  . Acid reflux   . Arthritis   . Blood dyscrasia    ITP  . Bruise 08/08/11   pt states brusing x 1 month  . Chest pain 08/08/2011   "a little in last month"  . 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 Fracture of left clavicle 02/08/2015  . Hypercholesteremia   .  Hypertension, Labile 11/03/2013   no meds  . Idiopathic thrombocytopenic purpura (ITP)   . Leg cramps 08/08/2011   bad in last 1.5 months  . Morbid obesity (HCC)   . Prediabetes   . Sleep apnea    wears CPAP "sometimes"  . Tick bites 08/08/11   60 to 70 ticks in last hunting season , last > 1 month   Health Maintenance  Topic Date Due  . TETANUS/TDAP  02/03/2025  . COLONOSCOPY  04/02/2027  . INFLUENZA VACCINE  Completed  . Hepatitis C Screening  Completed  . HIV Screening  Completed   Immunization History  Administered Date(s) Administered  . Influenza Split 05/24/2013, 06/22/2014, 04/18/2017  . Influenza, Seasonal, Injecte, Preservative Fre 07/03/2015  .  Influenza,inj,quad, With Preservative 08/07/2016  . PPD Test 06/22/2014, 07/03/2015, 08/07/2016, 09/09/2017  . Pneumococcal Polysaccharide-23 05/19/2012  . Td 08/18/2008, 02/04/2015   Last Colon - 04/01/2017 - Dr Ewing Schlein recc 10 yr f/u  Past Surgical History:  Procedure Laterality Date  . BACK SURGERY  07/2010   lumbar - 3 discs  . FRACTURE SURGERY     right foot fracture, no surgery  . LUMBAR DISC SURGERY  2011  . ORIF CLAVICULAR FRACTURE Left 02/08/2015   Procedure: OPEN REDUCTION INTERNAL FIXATION (ORIF) LEFT CLAVICULAR FRACTURE;  Surgeon: Teryl Lucy, MD;  Location: [facility];  Service: Orthopedics;  Laterality: Left;   Family History  Problem Relation Age of Onset  . Uterine cancer Mother   . Uterine cancer Sister   . Stroke Neg Hx    Social History   Socioeconomic History  . Marital status: Single    Spouse name: Not on file  . Number of children: 0  . Years of education: 10  Occupational History  . Occupation: [employer]   Tobacco Use  . Smoking status: Former Smoker    Packs/day: 1.00    Years: 8.00    Pack years: 8.00    Types: Cigarettes    Last attempt to quit: 01/03/2015    Years since quitting: 2.6  . Smokeless tobacco: Current User    Types: Chew  . Tobacco comment: smokes 1-2 cigarettes daily  Substance and Sexual Activity  . Alcohol use: Yes    Alcohol/week: 0.0 oz    Comment: drank 12 beers last night on 08/07/11, pt states rarely drinks  . Drug use: No    Comment: occasionally chews tobacco  . Sexual activity: Yes  Social History Narrative   Lives alone   Caffeine use: Coffee daily   Decaf tea    ROS Constitutional: Denies fever, chills, weight loss/gain, headaches, insomnia,  night sweats or change in appetite. Does c/o fatigue. Eyes: Denies redness, blurred vision, diplopia, discharge, itchy or watery eyes.  ENT: Denies discharge, congestion, post nasal drip, epistaxis, sore throat, earache, hearing loss, dental pain,  Tinnitus, Vertigo, Sinus pain or snoring.  Cardio: Denies chest pain, palpitations, irregular heartbeat, syncope, dyspnea, diaphoresis, orthopnea, PND, claudication or edema Respiratory: denies cough, dyspnea, DOE, pleurisy, hoarseness, laryngitis or wheezing.  Gastrointestinal: Denies dysphagia, heartburn, reflux, water brash, pain, cramps, nausea, vomiting, bloating, diarrhea, constipation, hematemesis, melena, hematochezia, jaundice or hemorrhoids Genitourinary: Denies dysuria, frequency, urgency, nocturia, hesitancy, discharge, hematuria or flank pain Musculoskeletal: Denies arthralgia, myalgia, stiffness, Jt. Swelling, pain, limp or strain/sprain. Denies Falls. Skin: Denies puritis, rash, hives, warts, acne, eczema or change in skin lesion Neuro: No weakness, tremor, incoordination, spasms, paresthesia or pain Psychiatric: Denies confusion, memory loss or sensory loss. Denies Depression.  Endocrine: Denies change in weight, skin, hair change, nocturia, and paresthesia, diabetic polys, visual blurring or hyper / hypo glycemic episodes.  Heme/Lymph: No excessive bleeding, bruising or enlarged lymph nodes.  Physical Exam  BP 140/86   Pulse 72   Temp 97.8 F (36.6 C)   Resp 16   Ht 5' 9.5" (1.765 m)   Wt 216 lb 9.6 oz (98.2 kg)   BMI 31.53 kg/m   General Appearance: Well nourished and well groomed and in no apparent distress.  Eyes: PERRLA, EOMs, conjunctiva no swelling or erythema, normal fundi and vessels. Sinuses: No frontal/maxillary tenderness ENT/Mouth: EACs patent / TMs  nl. Nares clear without erythema, swelling, mucoid exudates. Oral hygiene is good. No erythema, swelling, or exudate. Tongue normal, non-obstructing. Tonsils not swollen or erythematous. Hearing normal.  Neck: Supple, thyroid normal. No bruits, nodes or JVD. Respiratory: Respiratory effort normal.  BS equal and clear bilateral without rales, rhonci, wheezing or stridor. Cardio: Heart sounds are normal with  regular rate and rhythm and no murmurs, rubs or gallops. Peripheral pulses are normal and equal bilaterally without edema. No aortic or femoral bruits. Chest: symmetric with normal excursions and percussion.  Abdomen: Soft, with Nl bowel sounds. Nontender, no guarding, rebound, hernias, masses, or organomegaly.  Lymphatics: Non tender without lymphadenopathy.  Genitourinary: No hernias.Testes nl. DRE - prostate nl for age - smooth & firm w/o nodules. Musculoskeletal: Full ROM all peripheral extremities, joint stability, 5/5 strength, and normal gait. Skin: Warm and dry without rashes, lesions, cyanosis, clubbing or  ecchymosis.  Neuro: Cranial nerves intact, reflexes equal bilaterally. Normal muscle tone, no cerebellar symptoms. Sensation intact.  Pysch: Alert and oriented X 3 with normal affect, insight and judgment appropriate.   Assessment and Plan  1. Annual Preventative/Screening Exam   2. Essential hypertension  - EKG 12-Lead - US, RETROPERITNL ABD,  LTD - Urinalysis, Routine w reflex microscopic - Microalbumin / creatinine urine ratio - CBC with Differential/Platelet - BASIC METABOLIC PANEL WITH GFR - Magnesium - TSH  3. Hyperlipidemia, mixed  - EKG 12-Lead - US, RETROPERITNL ABD,  LTD - Hepatic function panel - Lipid panel - TSH  4. Prediabetes  - EKG 12-Lead - US, RETROPERITNL ABD,  LTD - Hemoglobin A1c - Insulin, random  5. Vitamin D deficiency  - VITAMIN D 25 Hydroxy    6. GERD  - CBC with Differential/Platelet  7. Screening for colorectal cancer  - POC Hemoccult Bld/Stl   8. Prostate cancer screening  - PSA  9. Screening for ischemic heart disease  - EKG 12-Lead  10. Former smoker  - EKG 12-Lead - US, RETROPERITNL ABD,  LTD  11. Screening for AAA (aortic abdominal aneurysm)  - US, RETROPERITNL ABD,  LTD  12. Screening examination for pulmonary tuberculosis  - PPD  13. Fatigue, unspecified type  - Iron,Total/Total Iron Binding  Cap - Vitamin B12 - Testosterone - CBC with Differential/Platelet - TSH  14. Medication management  - Urinalysis, Routine w reflex microscopic - Microalbumin / creatinine urine ratio - CBC with Differential/Platelet        Patient was counseled in prudent diet, weight control to achieve/maintain BMI less than 25, BP monitoring, regular exercise and medications as discussed.  Discussed med effects and SE's. Routine screening labs and tests as requested with regular follow-up as recommended. Over 40 minutes of exam, counseling, chart review and high complex critical decision making was performed

## 2017-09-10 LAB — INSULIN, RANDOM: INSULIN: 6.1 u[IU]/mL (ref 2.0–19.6)

## 2017-09-10 LAB — HEMOGLOBIN A1C
EAG (MMOL/L): 6 (calc)
HEMOGLOBIN A1C: 5.4 %{Hb} (ref <5.7)
MEAN PLASMA GLUCOSE: 108 (calc)

## 2017-09-10 LAB — TSH: TSH: 2.27 mIU/L (ref 0.40–4.50)

## 2017-09-10 LAB — VITAMIN D 25 HYDROXY (VIT D DEFICIENCY, FRACTURES): VIT D 25 HYDROXY: 79 ng/mL (ref 30–100)

## 2017-09-10 LAB — BASIC METABOLIC PANEL WITH GFR
BUN: 9 mg/dL (ref 7–25)
CALCIUM: 9.9 mg/dL (ref 8.6–10.3)
CO2: 28 mmol/L (ref 20–32)
Chloride: 105 mmol/L (ref 98–110)
Creat: 1.03 mg/dL (ref 0.70–1.33)
GFR, EST AFRICAN AMERICAN: 93 mL/min/{1.73_m2} (ref >=60)
GFR, EST NON AFRICAN AMERICAN: 80 mL/min/{1.73_m2} (ref >=60)
Glucose, Bld: 90 mg/dL (ref 65–99)
POTASSIUM: 5 mmol/L (ref 3.5–5.3)
Sodium: 140 mmol/L (ref 135–146)

## 2017-09-10 LAB — MICROALBUMIN / CREATININE URINE RATIO
CREATININE, URINE: 109 mg/dL (ref 20–320)
Microalb Creat Ratio: 4 mcg/mg creat (ref <30)
Microalb, Ur: 0.4 mg/dL

## 2017-09-10 LAB — CBC WITH DIFFERENTIAL/PLATELET
BASOS ABS: 59 {cells}/uL (ref 0–200)
BASOS PCT: 0.6 %
EOS PCT: 2.2 %
Eosinophils Absolute: 216 cells/uL (ref 15–500)
HCT: 44.1 % (ref 38.5–50.0)
HEMOGLOBIN: 15 g/dL (ref 13.2–17.1)
Lymphs Abs: 2999 cells/uL (ref 850–3900)
MCH: 29.4 pg (ref 27.0–33.0)
MCHC: 34 g/dL (ref 32.0–36.0)
MCV: 86.3 fL (ref 80.0–100.0)
MONOS PCT: 5.5 %
MPV: 10.6 fL (ref 7.5–12.5)
NEUTROS ABS: 5988 {cells}/uL (ref 1500–7800)
Neutrophils Relative %: 61.1 %
Platelets: 175 10*3/uL (ref 140–400)
RBC: 5.11 10*6/uL (ref 4.20–5.80)
RDW: 12.2 % (ref 11.0–15.0)
Total Lymphocyte: 30.6 %
WBC mixed population: 539 cells/uL (ref 200–950)
WBC: 9.8 10*3/uL (ref 3.8–10.8)

## 2017-09-10 LAB — MAGNESIUM: MAGNESIUM: 2.2 mg/dL (ref 1.5–2.5)

## 2017-09-10 LAB — IRON, TOTAL/TOTAL IRON BINDING CAP
%SAT: 17 % (calc) (ref 15–60)
IRON: 65 ug/dL (ref 50–180)
TIBC: 374 ug/dL (ref 250–425)

## 2017-09-10 LAB — HEPATIC FUNCTION PANEL
AG RATIO: 1.5 (calc) (ref 1.0–2.5)
ALBUMIN MSPROF: 4.6 g/dL (ref 3.6–5.1)
ALKALINE PHOSPHATASE (APISO): 90 U/L (ref 40–115)
ALT: 16 U/L (ref 9–46)
AST: 21 U/L (ref 10–35)
BILIRUBIN INDIRECT: 0.4 mg/dL (ref 0.2–1.2)
Bilirubin, Direct: 0.1 mg/dL (ref 0.0–0.2)
GLOBULIN: 3 g/dL (ref 1.9–3.7)
Total Bilirubin: 0.5 mg/dL (ref 0.2–1.2)
Total Protein: 7.6 g/dL (ref 6.1–8.1)

## 2017-09-10 LAB — URINALYSIS, ROUTINE W REFLEX MICROSCOPIC
BILIRUBIN URINE: NEGATIVE
GLUCOSE, UA: NEGATIVE
Hgb urine dipstick: NEGATIVE
Ketones, ur: NEGATIVE
Leukocytes, UA: NEGATIVE
Nitrite: NEGATIVE
PH: 6.5 (ref 5.0–8.0)
Protein, ur: NEGATIVE
Specific Gravity, Urine: 1.015 (ref 1.001–1.03)

## 2017-09-10 LAB — LIPID PANEL
CHOLESTEROL: 116 mg/dL (ref <200)
HDL: 28 mg/dL — AB (ref >40)
LDL Cholesterol (Calc): 65 mg/dL (calc)
Non-HDL Cholesterol (Calc): 88 mg/dL (calc) (ref <130)
Total CHOL/HDL Ratio: 4.1 (calc) (ref <5.0)
Triglycerides: 146 mg/dL (ref <150)

## 2017-09-10 LAB — TESTOSTERONE: Testosterone: 436 ng/dL (ref 250–827)

## 2017-09-10 LAB — VITAMIN B12: Vitamin B-12: 928 pg/mL (ref 200–1100)

## 2017-09-10 LAB — PSA: PSA: 0.2 ng/mL (ref <=4.0)

## 2017-09-11 LAB — TB SKIN TEST
Induration: 0 mm
TB Skin Test: NEGATIVE

## 2017-09-12 ENCOUNTER — Other Ambulatory Visit: Payer: Self-pay | Admitting: Internal Medicine

## 2017-09-12 DIAGNOSIS — E782 Mixed hyperlipidemia: Secondary | ICD-10-CM

## 2017-09-12 MED ORDER — ROSUVASTATIN CALCIUM 40 MG PO TABS: ORAL_TABLET | ORAL | 1 refills | Status: DC

## 2017-10-05 ENCOUNTER — Other Ambulatory Visit: Payer: Self-pay | Admitting: Internal Medicine

## 2017-10-05 DIAGNOSIS — F419 Anxiety disorder, unspecified: Secondary | ICD-10-CM

## 2017-11-05 ENCOUNTER — Other Ambulatory Visit: Payer: Self-pay | Admitting: Physician Assistant

## 2017-11-05 DIAGNOSIS — F419 Anxiety disorder, unspecified: Secondary | ICD-10-CM

## 2017-12-06 ENCOUNTER — Other Ambulatory Visit: Payer: Self-pay | Admitting: Internal Medicine

## 2017-12-06 DIAGNOSIS — F419 Anxiety disorder, unspecified: Secondary | ICD-10-CM

## 2017-12-07 NOTE — Progress Notes (Signed)
Assessment and Plan:    Essential hypertension - continue medications, DASH diet, exercise and monitor at home. Call if greater than 130/80.  -     CBC with Differential/Platelet -     COMPLETE METABOLIC PANEL WITH GFR -     TSH  Idiopathic thrombocytopenic purpura (ITP) (HCC) Monitor  Morbid obesity (HCC) - follow up 3 months for progress monitoring - increase veggies, decrease carbs - long discussion about weight loss, diet, and exercise  Hyperlipidemia, mixed -continue medications, check lipids, decrease fatty foods, increase activity.  -     Lipid panel  Medication management -     Magnesium  Thrombocytopenia (HCC) Monitor  Myalgia Get on tumeric -     Magnesium -     CK   Continue diet and meds as discussed. Further disposition pending results of labs.  HPI 57 y.o. male  presents for 3 month follow up with hypertension, hyperlipidemia, prediabetes and vitamin D.   His blood pressure has been controlled at home, today their BP is BP: 130/74.   He does workout. He denies chest pain, shortness of breath, dizziness.  He reports that he has been getting a lot of walking in.  He has been walking every where he goes.     He is on cholesterol medication, he is on crestor 1 pill a day and denies myalgias. His cholesterol is at goal. The cholesterol last visit was:   Lab Results  Component Value Date   CHOL 116 09/09/2017   HDL 28 (L) 09/09/2017   LDLCALC 65 09/09/2017   TRIG 146 09/09/2017   CHOLHDL 4.1 09/09/2017    He has been working on diet and exercise for prediabetes, and denies foot ulcerations, hyperglycemia, hypoglycemia , increased appetite, nausea, paresthesia of the feet, polydipsia, polyuria, visual disturbances, vomiting and weight loss. Last A1C in the office was:  Lab Results  Component Value Date   HGBA1C 5.4 09/09/2017   Patient is on Vitamin D supplement.  Lab Results  Component Value Date   VD25OH 79 09/09/2017     BMI is Body mass index is  31.56 kg/m., he is working on diet and exercise. Wt Readings from Last 3 Encounters:  12/09/17 216 lb 12.8 oz (98.3 kg)  09/09/17 216 lb 9.6 oz (98.2 kg)  08/07/16 212 lb (96.2 kg)    Current Medications:  Current Outpatient Medications on File Prior to Visit  Medication Sig Dispense Refill  . ALPRAZolam (XANAX) 1 MG tablet 1/2-1 TAB 2-3 TIMES A DAY IF NEED FOR ANXIETY ATTACK.LIMIT 15 TABS OR 5 DAYS/WEEK TO AVOID ADDICTION 60 tablet 0  . amitriptyline (ELAVIL) 25 MG tablet TAKE 1/2-1 TABLET BY MOUTH AT NIGHT FOR SLEEP, NERVE PAIN, AND DIZZINESS 90 tablet 1  . Cholecalciferol (VITAMIN D PO) Take 10,000 Int'l Units by mouth daily.     . Flaxseed, Linseed, (FLAXSEED OIL) 1200 MG CAPS Take 1 capsule by mouth daily.     . Omega-3 Fatty Acids (FISH OIL) 1200 MG CAPS Take 1 capsule by mouth daily.     Marland Kitchen omeprazole (PRILOSEC) 40 MG capsule TAKE 1 CAPSULE BY MOUTH DAILY 90 capsule 1  . rosuvastatin (CRESTOR) 40 MG tablet TAKE 1/2 TO 1 TABLET DAILY OR AS DIRECTED FOR CHOLESTEROL 90 tablet 1   No current facility-administered medications on file prior to visit.     Medical History:  Past Medical History:  Diagnosis Date  . Acid reflux   . Arthritis   . Blood dyscrasia  ITP  . Bruise 08/08/11   pt states brusing x 1 month  . Chest pain 08/08/2011   "a little in last month"  . 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 Fracture of left clavicle 02/08/2015  . Hypercholesteremia   . Hypertension, Labile 11/03/2013   no meds  . Idiopathic thrombocytopenic purpura (ITP) (HCC)   . Leg cramps 08/08/2011   bad in last 1.5 months  . Morbid obesity (HCC)   . Prediabetes   . Sleep apnea    wears CPAP "sometimes"  . Tick bites 08/08/11   60 to 70 ticks in last hunting season , last > 1 month    Allergies:  Allergies  Allergen Reactions  . Niacin And Related Swelling    Red rash and swelling  . Gemfibrozil   . Penicillins  Other (See Comments)    Unknown childhood allergy Received 2 Gms Ancef with no obvious reaction     Review of Systems:  Review of Systems  Constitutional: Negative for chills, fever and malaise/fatigue.  HENT: Negative for congestion, ear pain and sore throat.   Eyes: Negative for blurred vision.  Respiratory: Negative for cough, shortness of breath and wheezing.   Cardiovascular: Negative for chest pain, palpitations and leg swelling.  Gastrointestinal: Negative for abdominal pain, blood in stool, constipation, diarrhea, heartburn and melena.  Genitourinary: Negative.   Musculoskeletal: Positive for myalgias and neck pain.  Neurological: Negative for dizziness, tingling, tremors, sensory change, speech change, focal weakness, seizures, loss of consciousness and headaches.  Psychiatric/Behavioral: Negative for depression. The patient is not nervous/anxious and does not have insomnia.    Allergies Allergies  Allergen Reactions  . Niacin And Related Swelling    Red rash and swelling  . Gemfibrozil   . Penicillins Other (See Comments)    Unknown childhood allergy Received 2 Gms Ancef with no obvious reaction    SURGICAL HISTORY He  has a past surgical history that includes Back surgery (07/2010); Lumbar disc surgery (2011); Fracture surgery; and ORIF clavicular fracture (Left, 02/08/2015). FAMILY HISTORY His family history includes Uterine cancer in his mother and sister. SOCIAL HISTORY He  reports that he quit smoking about 2 years ago. His smoking use included cigarettes. He has a 8.00 pack-year smoking history. His smokeless tobacco use includes chew. He reports that he drinks alcohol. He reports that he does not use drugs.  Physical Exam: BP 130/74   Pulse 67   Temp (!) 97.5 F (36.4 C)   Resp 18   Ht 5' 9.5" (1.765 m)   Wt 216 lb 12.8 oz (98.3 kg)   SpO2 97%   BMI 31.56 kg/m  Wt Readings from Last 3 Encounters:  12/09/17 216 lb 12.8 oz (98.3 kg)  09/09/17 216 lb 9.6  oz (98.2 kg)  08/07/16 212 lb (96.2 kg)   General Appearance: Well nourished well developed, in no apparent distress. Eyes: PERRLA, EOMs, conjunctiva no swelling or erythema ENT/Mouth: Ear canals normal without obstruction, swelling, erythma, discharge.  TMs normal bilaterally.  Oropharynx moist, clear, without exudate, or postoropharyngeal swelling. Neck: Supple, thyroid normal,no cervical adenopathy  Respiratory: Respiratory effort normal, Breath sounds clear A&P without rhonchi, wheeze, or rale.  No retractions, no accessory usage. Cardio: RRR with no MRGs. Brisk peripheral pulses without edema.  Abdomen: Soft, + BS,  Non tender, no guarding, rebound, hernias, masses. Musculoskeletal: Full ROM, 5/5 strength, Normal gait Skin: Warm, dry  without rashes, lesions, ecchymosis.  Neuro: Awake and oriented X 3, Cranial nerves intact. Normal muscle tone, no cerebellar symptoms. Psych: Normal affect, Insight and Judgment appropriate.    Quentin Mulling, PA-C 11:32 AM [facility]

## 2017-12-09 ENCOUNTER — Ambulatory Visit: Payer: BLUE CROSS/BLUE SHIELD | Admitting: Physician Assistant

## 2017-12-09 ENCOUNTER — Ambulatory Visit: Payer: Self-pay | Admitting: Adult Health

## 2017-12-09 ENCOUNTER — Encounter: Payer: Self-pay | Admitting: Physician Assistant

## 2017-12-09 VITALS — BP 130/74 | HR 67 | Temp 97.5°F | Resp 18 | Ht 69.5 in | Wt 216.8 lb

## 2017-12-09 DIAGNOSIS — Z79899 Other long term (current) drug therapy: Secondary | ICD-10-CM

## 2017-12-09 DIAGNOSIS — M791 Myalgia, unspecified site: Secondary | ICD-10-CM | POA: Diagnosis not present

## 2017-12-09 DIAGNOSIS — E782 Mixed hyperlipidemia: Secondary | ICD-10-CM

## 2017-12-09 DIAGNOSIS — D693 Immune thrombocytopenic purpura: Secondary | ICD-10-CM | POA: Diagnosis not present

## 2017-12-09 DIAGNOSIS — I10 Essential (primary) hypertension: Secondary | ICD-10-CM

## 2017-12-09 DIAGNOSIS — D696 Thrombocytopenia, unspecified: Secondary | ICD-10-CM

## 2017-12-09 NOTE — Patient Instructions (Addendum)
Tumeric with black pepper extract is a great natural antiinflammatory that helps with arthritis and aches and pain. Can get from [employer] or any health food store. Need to take at least 800mg  twice a day with food.   Getting in a pool 1-2 x a week would be the best  Being dehydrated can hurt your kidneys, cause fatigue, headaches, muscle aches, joint pain, and dry skin/nails so please increase your fluids.   Drink 80-100 oz a day of water, measure it out!

## 2017-12-14 LAB — CBC WITH DIFFERENTIAL/PLATELET
BASOS ABS: 59 {cells}/uL (ref 0–200)
BASOS PCT: 0.6 %
Eosinophils Absolute: 294 cells/uL (ref 15–500)
Eosinophils Relative: 3 %
HCT: 42.1 % (ref 38.5–50.0)
Hemoglobin: 14.4 g/dL (ref 13.2–17.1)
Lymphs Abs: 2999 cells/uL (ref 850–3900)
MCH: 29.3 pg (ref 27.0–33.0)
MCHC: 34.2 g/dL (ref 32.0–36.0)
MCV: 85.7 fL (ref 80.0–100.0)
MONOS PCT: 6.7 %
MPV: 10.2 fL (ref 7.5–12.5)
NEUTROS PCT: 59.1 %
Neutro Abs: 5792 cells/uL (ref 1500–7800)
PLATELETS: 146 10*3/uL (ref 140–400)
RBC: 4.91 10*6/uL (ref 4.20–5.80)
RDW: 12.7 % (ref 11.0–15.0)
Total Lymphocyte: 30.6 %
WBC: 9.8 10*3/uL (ref 3.8–10.8)
WBCMIX: 657 {cells}/uL (ref 200–950)

## 2017-12-14 LAB — COMPLETE METABOLIC PANEL WITH GFR
AG RATIO: 1.6 (calc) (ref 1.0–2.5)
ALKALINE PHOSPHATASE (APISO): 93 U/L (ref 40–115)
ALT: 19 U/L (ref 9–46)
AST: 18 U/L (ref 10–35)
Albumin: 4.3 g/dL (ref 3.6–5.1)
BILIRUBIN TOTAL: 0.4 mg/dL (ref 0.2–1.2)
BUN: 8 mg/dL (ref 7–25)
CHLORIDE: 104 mmol/L (ref 98–110)
CO2: 28 mmol/L (ref 20–32)
Calcium: 9.3 mg/dL (ref 8.6–10.3)
Creat: 0.97 mg/dL (ref 0.70–1.33)
GFR, Est African American: 100 mL/min/{1.73_m2} (ref >=60)
GFR, Est Non African American: 86 mL/min/{1.73_m2} (ref >=60)
GLUCOSE: 90 mg/dL (ref 65–99)
Globulin: 2.7 g/dL (calc) (ref 1.9–3.7)
POTASSIUM: 4.4 mmol/L (ref 3.5–5.3)
Sodium: 138 mmol/L (ref 135–146)
TOTAL PROTEIN: 7 g/dL (ref 6.1–8.1)

## 2017-12-14 LAB — LIPID PANEL
Cholesterol: 110 mg/dL (ref <200)
HDL: 31 mg/dL — AB (ref >40)
LDL Cholesterol (Calc): 56 mg/dL (calc)
NON-HDL CHOLESTEROL (CALC): 79 mg/dL (ref <130)
Total CHOL/HDL Ratio: 3.5 (calc) (ref <5.0)
Triglycerides: 143 mg/dL (ref <150)

## 2017-12-14 LAB — TSH: TSH: 1.39 m[IU]/L (ref 0.40–4.50)

## 2017-12-14 LAB — CK: Total CK: 76 U/L (ref 44–196)

## 2017-12-14 LAB — MAGNESIUM: MAGNESIUM: 2.3 mg/dL (ref 1.5–2.5)

## 2017-12-24 ENCOUNTER — Other Ambulatory Visit: Payer: Self-pay | Admitting: Neurosurgery

## 2017-12-24 DIAGNOSIS — G935 Compression of brain: Secondary | ICD-10-CM

## 2018-01-06 ENCOUNTER — Other Ambulatory Visit: Payer: Self-pay | Admitting: Physician Assistant

## 2018-01-06 DIAGNOSIS — F419 Anxiety disorder, unspecified: Secondary | ICD-10-CM

## 2018-02-06 ENCOUNTER — Other Ambulatory Visit: Payer: Self-pay | Admitting: Internal Medicine

## 2018-02-06 DIAGNOSIS — F419 Anxiety disorder, unspecified: Secondary | ICD-10-CM

## 2018-03-08 ENCOUNTER — Other Ambulatory Visit: Payer: Self-pay | Admitting: Internal Medicine

## 2018-03-08 DIAGNOSIS — F419 Anxiety disorder, unspecified: Secondary | ICD-10-CM

## 2018-03-11 ENCOUNTER — Encounter: Payer: Self-pay | Admitting: Internal Medicine

## 2018-03-11 ENCOUNTER — Ambulatory Visit (INDEPENDENT_AMBULATORY_CARE_PROVIDER_SITE_OTHER): Payer: BLUE CROSS/BLUE SHIELD | Admitting: Internal Medicine

## 2018-03-11 ENCOUNTER — Other Ambulatory Visit: Payer: Self-pay | Admitting: Internal Medicine

## 2018-03-11 VITALS — BP 112/82 | HR 76 | Temp 97.0°F | Resp 16 | Ht 69.5 in | Wt 215.2 lb

## 2018-03-11 DIAGNOSIS — N401 Enlarged prostate with lower urinary tract symptoms: Secondary | ICD-10-CM

## 2018-03-11 DIAGNOSIS — E782 Mixed hyperlipidemia: Secondary | ICD-10-CM

## 2018-03-11 DIAGNOSIS — I10 Essential (primary) hypertension: Secondary | ICD-10-CM

## 2018-03-11 DIAGNOSIS — R7303 Prediabetes: Secondary | ICD-10-CM

## 2018-03-11 DIAGNOSIS — Z79899 Other long term (current) drug therapy: Secondary | ICD-10-CM | POA: Diagnosis not present

## 2018-03-11 DIAGNOSIS — E559 Vitamin D deficiency, unspecified: Secondary | ICD-10-CM

## 2018-03-11 DIAGNOSIS — Z125 Encounter for screening for malignant neoplasm of prostate: Secondary | ICD-10-CM

## 2018-03-11 DIAGNOSIS — R350 Frequency of micturition: Secondary | ICD-10-CM

## 2018-03-11 NOTE — Patient Instructions (Signed)

## 2018-03-11 NOTE — Progress Notes (Signed)
This very nice 57 y.o. [name] presents for 6 month follow up with HTN, HLD, Pre-Diabetes and Vitamin D Deficiency. Patient has OSA & uses his CPAP sporadically. Patient has GERD controlled w/Omeprazole.      Patient is followed expectantly for labile HTN & BP has been controlled at home. Today's BP is at goal -112/82. Patient has had no complaints of any cardiac type chest pain, palpitations, dyspnea / orthopnea / PND, dizziness, claudication, or dependent edema.     Hyperlipidemia is controlled with diet & meds. Patient denies myalgias or other med SE's. Last Lipids were  Lab Results  Component Value Date   CHOL 110 12/09/2017   HDL 31 (L) 12/09/2017   LDLCALC 56 12/09/2017   TRIG 143 12/09/2017   CHOLHDL 3.5 12/09/2017      Also, the patient has history of PreDiabete (A1c 5.7%/2012 and then 5.6%/2013) and has had no symptoms of reactive hypoglycemia, diabetic polys, paresthesias or visual blurring.  Last A1c was  Lab Results  Component Value Date   HGBA1C 5.4 09/09/2017      Further, the patient also has history of Vitamin D Deficiency and supplements vitamin D without any suspected side-effects. Last vitamin D was   Lab Results  Component Value Date   VD25OH 79 09/09/2017   Current Outpatient Medications on File Prior to Visit  Medication Sig  . ALPRAZolam (XANAX) 1 MG tablet Take 1/2 to 1 tablet 2 to 3 x /day ONLY if having a severe Anxiety Attack & please limit to 10 tabs /week to avoid addiction  . Cholecalciferol (VITAMIN D PO) Take 10,000 Int'l Units by mouth daily.   . Flaxseed, Linseed, (FLAXSEED OIL) 1200 MG CAPS Take 1 capsule by mouth daily.   . Omega-3 Fatty Acids (FISH OIL) 1200 MG CAPS Take 1 capsule by mouth daily.    No current facility-administered medications on file prior to visit.    Allergies  Allergen Reactions  . Niacin And Related Swelling    Red rash and swelling  . Gemfibrozil   . Penicillins Other (See Comments)    Unknown childhood  allergy Received 2 Gms Ancef with no obvious reaction   PMHx:   Past Medical History:  Diagnosis Date  . Acid reflux   . Arthritis   . Blood dyscrasia    ITP  . Bruise 08/08/11   pt states brusing x 1 month  . Chest pain 08/08/2011   "a little in last month"  . 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 Fracture of left clavicle 02/08/2015  . Hypercholesteremia   . Hypertension, Labile 11/03/2013   no meds  . Idiopathic thrombocytopenic purpura (ITP) (HCC)   . Leg cramps 08/08/2011   bad in last 1.5 months  . Morbid obesity (HCC)   . Prediabetes   . Sleep apnea    wears CPAP "sometimes"  . Tick bites 08/08/11   60 to 70 ticks in last hunting season , last > 1 month   Immunization History  Administered Date(s) Administered  . Influenza Split 05/24/2013, 06/22/2014, 04/18/2017  . Influenza, Seasonal, Injecte, Preservative Fre 07/03/2015  . Influenza,inj,quad, With Preservative 08/07/2016  . PPD Test 06/22/2014, 07/03/2015, 08/07/2016, 09/09/2017  . Pneumococcal Polysaccharide-23 05/19/2012  . Td 08/18/2008, 02/04/2015   Past Surgical History:  Procedure Laterality Date  . BACK SURGERY  07/2010   lumbar - 3 discs  . FRACTURE  SURGERY     right foot fracture, no surgery  . LUMBAR DISC SURGERY  2011  . ORIF CLAVICULAR FRACTURE Left 02/08/2015   Procedure: OPEN REDUCTION INTERNAL FIXATION (ORIF) LEFT CLAVICULAR FRACTURE;  Surgeon: Teryl Lucy, MD;  Location: [facility];  Service: Orthopedics;  Laterality: Left;   FHx:    Reviewed / unchanged  SHx:    Reviewed / unchanged   Systems Review:  Constitutional: Denies fever, chills, wt changes, headaches, insomnia, fatigue, night sweats, change in appetite. Eyes: Denies redness, blurred vision, diplopia, discharge, itchy, watery eyes.  ENT: Denies discharge, congestion, post nasal drip, epistaxis, sore throat, earache, hearing loss, dental pain,  tinnitus, vertigo, sinus pain, snoring.  CV: Denies chest pain, palpitations, irregular heartbeat, syncope, dyspnea, diaphoresis, orthopnea, PND, claudication or edema. Respiratory: denies cough, dyspnea, DOE, pleurisy, hoarseness, laryngitis, wheezing.  Gastrointestinal: Denies dysphagia, odynophagia, heartburn, reflux, water brash, abdominal pain or cramps, nausea, vomiting, bloating, diarrhea, constipation, hematemesis, melena, hematochezia  or hemorrhoids. Genitourinary: Denies dysuria, frequency, urgency, nocturia, hesitancy, discharge, hematuria or flank pain. Musculoskeletal: Denies arthralgias, myalgias, stiffness, jt. swelling, pain, limping or strain/sprain.  Skin: Denies pruritus, rash, hives, warts, acne, eczema or change in skin lesion(s). Neuro: No weakness, tremor, incoordination, spasms, paresthesia or pain. Psychiatric: Denies confusion, memory loss or sensory loss. Endo: Denies change in weight, skin or hair change.  Heme/Lymph: No excessive bleeding, bruising or enlarged lymph nodes.  Physical Exam  BP 112/82   Pulse 76   Temp (!) 97 F (36.1 C)   Resp 16   Ht 5' 9.5" (1.765 m)   Wt 215 lb 3.2 oz (97.6 kg)   BMI 31.32 kg/m   Appears  well nourished, well groomed  and in no distress.  Eyes: PERRLA, EOMs, conjunctiva no swelling or erythema. Sinuses: No frontal/maxillary tenderness ENT/Mouth: EAC's clear, TM's nl w/o erythema, bulging. Nares clear w/o erythema, swelling, exudates. Oropharynx clear without erythema or exudates. Oral hygiene is good. Tongue normal, non obstructing. Hearing intact.  Neck: Supple. Thyroid not palpable. Car 2+/2+ without bruits, nodes or JVD. Chest: Respirations nl with BS clear & equal w/o rales, rhonchi, wheezing or stridor.  Cor: Heart sounds normal w/ regular rate and rhythm without sig. murmurs, gallops, clicks or rubs. Peripheral pulses normal and equal  without edema.  Abdomen: Soft & bowel sounds normal. Non-tender w/o guarding,  rebound, hernias, masses or organomegaly.  Lymphatics: Unremarkable.  Musculoskeletal: Full ROM all peripheral extremities, joint stability, 5/5 strength and normal gait.  Skin: Warm, dry without exposed rashes, lesions or ecchymosis apparent.  Neuro: Cranial nerves intact, reflexes equal bilaterally. Sensory-motor testing grossly intact. Tendon reflexes grossly intact.  Pysch: Alert & oriented x 3.  Insight and judgement nl & appropriate. No ideations.  Assessment and Plan:  1. Essential hypertension  - Continue medication, monitor blood pressure at home.  - Continue DASH diet.  Reminder to go to the ER if any CP,  SOB, nausea, dizziness, severe HA, changes vision/speech.  - CBC with Differential/Platelet - COMPLETE METABOLIC PANEL WITH GFR - Magnesium - TSH  2. Hyperlipidemia, mixed  - Continue diet/meds, exercise,& lifestyle modifications.  - Continue monitor periodic cholesterol/liver & renal functions   - Lipid panel - TSH  3. Prediabetes  - Continue diet, exercise, lifestyle modifications.  - Monitor appropriate labs.  - Hemoglobin A1c - Insulin, random  4. Vitamin D deficiency  - Continue supplementation.  - VITAMIN D 25 Hydroxyl  5. Medication management  - CBC with Differential/Platelet - COMPLETE METABOLIC  PANEL WITH GFR - Magnesium - Lipid panel - TSH - Hemoglobin A1c - Insulin, random - VITAMIN D 25 Hydroxy       Discussed  regular exercise, BP monitoring, weight control to achieve/maintain BMI less than 25 and discussed med and SE's. Recommended labs to assess and monitor clinical status with further disposition pending results of labs. Over 30 minutes of exam, counseling, chart review was performed.

## 2018-03-12 LAB — CBC WITH DIFFERENTIAL/PLATELET
Basophils Absolute: 56 cells/uL (ref 0–200)
Basophils Relative: 0.6 %
EOS ABS: 291 {cells}/uL (ref 15–500)
Eosinophils Relative: 3.1 %
HCT: 43.8 % (ref 38.5–50.0)
Hemoglobin: 15.1 g/dL (ref 13.2–17.1)
Lymphs Abs: 3346 cells/uL (ref 850–3900)
MCH: 29.9 pg (ref 27.0–33.0)
MCHC: 34.5 g/dL (ref 32.0–36.0)
MCV: 86.7 fL (ref 80.0–100.0)
MONOS PCT: 6.2 %
MPV: 10.7 fL (ref 7.5–12.5)
NEUTROS PCT: 54.5 %
Neutro Abs: 5123 cells/uL (ref 1500–7800)
PLATELETS: 154 10*3/uL (ref 140–400)
RBC: 5.05 10*6/uL (ref 4.20–5.80)
RDW: 12.6 % (ref 11.0–15.0)
TOTAL LYMPHOCYTE: 35.6 %
WBC: 9.4 10*3/uL (ref 3.8–10.8)
WBCMIX: 583 {cells}/uL (ref 200–950)

## 2018-03-12 LAB — COMPLETE METABOLIC PANEL WITH GFR
AG RATIO: 2 (calc) (ref 1.0–2.5)
ALKALINE PHOSPHATASE (APISO): 81 U/L (ref 40–115)
ALT: 19 U/L (ref 9–46)
AST: 19 U/L (ref 10–35)
Albumin: 4.9 g/dL (ref 3.6–5.1)
BILIRUBIN TOTAL: 0.6 mg/dL (ref 0.2–1.2)
BUN: 10 mg/dL (ref 7–25)
CO2: 28 mmol/L (ref 20–32)
Calcium: 9.6 mg/dL (ref 8.6–10.3)
Chloride: 103 mmol/L (ref 98–110)
Creat: 0.98 mg/dL (ref 0.70–1.33)
GFR, Est African American: 99 mL/min/{1.73_m2} (ref >=60)
GFR, Est Non African American: 85 mL/min/{1.73_m2} (ref >=60)
GLUCOSE: 93 mg/dL (ref 65–99)
Globulin: 2.4 g/dL (calc) (ref 1.9–3.7)
POTASSIUM: 4.1 mmol/L (ref 3.5–5.3)
Sodium: 138 mmol/L (ref 135–146)
Total Protein: 7.3 g/dL (ref 6.1–8.1)

## 2018-03-12 LAB — HEMOGLOBIN A1C
EAG (MMOL/L): 6.3 (calc)
HEMOGLOBIN A1C: 5.6 %{Hb} (ref <5.7)
Mean Plasma Glucose: 114 (calc)

## 2018-03-12 LAB — LIPID PANEL
CHOLESTEROL: 102 mg/dL (ref <200)
HDL: 27 mg/dL — AB (ref >40)
LDL Cholesterol (Calc): 52 mg/dL (calc)
NON-HDL CHOLESTEROL (CALC): 75 mg/dL (ref <130)
TRIGLYCERIDES: 157 mg/dL — AB (ref <150)
Total CHOL/HDL Ratio: 3.8 (calc) (ref <5.0)

## 2018-03-12 LAB — INSULIN, RANDOM: INSULIN: 8.2 u[IU]/mL (ref 2.0–19.6)

## 2018-03-12 LAB — MAGNESIUM: MAGNESIUM: 2.1 mg/dL (ref 1.5–2.5)

## 2018-03-12 LAB — VITAMIN D 25 HYDROXY (VIT D DEFICIENCY, FRACTURES): VIT D 25 HYDROXY: 78 ng/mL (ref 30–100)

## 2018-03-12 LAB — TSH: TSH: 1.86 mIU/L (ref 0.40–4.50)

## 2018-03-14 ENCOUNTER — Encounter: Payer: Self-pay | Admitting: Internal Medicine

## 2018-03-16 ENCOUNTER — Other Ambulatory Visit: Payer: Self-pay | Admitting: Internal Medicine

## 2018-03-16 DIAGNOSIS — G4700 Insomnia, unspecified: Secondary | ICD-10-CM

## 2018-03-16 MED ORDER — GABAPENTIN 600 MG PO TABS: ORAL_TABLET | ORAL | 0 refills | Status: DC

## 2018-03-17 ENCOUNTER — Telehealth: Payer: Self-pay | Admitting: *Deleted

## 2018-03-17 NOTE — Telephone Encounter (Signed)
Patient called and states he refuses to take the Gabapentin,due to side effects. He states he will try Melatonin. Dr Oneta RackMcKeown is aware.

## 2018-04-10 ENCOUNTER — Other Ambulatory Visit: Payer: Self-pay | Admitting: Internal Medicine

## 2018-04-10 DIAGNOSIS — F419 Anxiety disorder, unspecified: Secondary | ICD-10-CM

## 2018-04-10 DIAGNOSIS — G4700 Insomnia, unspecified: Secondary | ICD-10-CM

## 2018-05-18 ENCOUNTER — Other Ambulatory Visit: Payer: Self-pay | Admitting: Internal Medicine

## 2018-05-18 DIAGNOSIS — F419 Anxiety disorder, unspecified: Secondary | ICD-10-CM

## 2018-05-19 ENCOUNTER — Other Ambulatory Visit: Payer: Self-pay | Admitting: Internal Medicine

## 2018-05-19 DIAGNOSIS — F419 Anxiety disorder, unspecified: Secondary | ICD-10-CM

## 2018-05-19 MED ORDER — ALPRAZOLAM 1 MG PO TABS: ORAL_TABLET | ORAL | 0 refills | Status: DC

## 2018-06-27 ENCOUNTER — Other Ambulatory Visit: Payer: Self-pay | Admitting: Internal Medicine

## 2018-06-27 DIAGNOSIS — F419 Anxiety disorder, unspecified: Secondary | ICD-10-CM

## 2018-07-12 ENCOUNTER — Other Ambulatory Visit: Payer: Self-pay | Admitting: Internal Medicine

## 2018-07-12 DIAGNOSIS — E782 Mixed hyperlipidemia: Secondary | ICD-10-CM

## 2018-07-26 ENCOUNTER — Other Ambulatory Visit: Payer: Self-pay | Admitting: Internal Medicine

## 2018-07-30 ENCOUNTER — Other Ambulatory Visit: Payer: Self-pay | Admitting: Physician Assistant

## 2018-07-30 DIAGNOSIS — F419 Anxiety disorder, unspecified: Secondary | ICD-10-CM

## 2018-08-30 ENCOUNTER — Other Ambulatory Visit: Payer: Self-pay | Admitting: Internal Medicine

## 2018-08-30 DIAGNOSIS — F419 Anxiety disorder, unspecified: Secondary | ICD-10-CM

## 2018-09-13 ENCOUNTER — Encounter: Payer: Self-pay | Admitting: Adult Health

## 2018-09-13 ENCOUNTER — Ambulatory Visit (INDEPENDENT_AMBULATORY_CARE_PROVIDER_SITE_OTHER): Payer: BLUE CROSS/BLUE SHIELD | Admitting: Adult Health

## 2018-09-13 VITALS — BP 124/82 | HR 69 | Temp 97.5°F | Ht 69.5 in | Wt 220.0 lb

## 2018-09-13 DIAGNOSIS — J302 Other seasonal allergic rhinitis: Secondary | ICD-10-CM | POA: Diagnosis not present

## 2018-09-13 DIAGNOSIS — H9201 Otalgia, right ear: Secondary | ICD-10-CM

## 2018-09-13 MED ORDER — AZITHROMYCIN 250 MG PO TABS: ORAL_TABLET | ORAL | 1 refills | Status: AC

## 2018-09-13 MED ORDER — FEXOFENADINE HCL 180 MG PO TABS: 180.0000 mg | ORAL_TABLET | Freq: Every day | ORAL | 2 refills | Status: DC

## 2018-09-13 MED ORDER — PREDNISONE 20 MG PO TABS: ORAL_TABLET | ORAL | 0 refills | Status: DC

## 2018-09-13 MED ORDER — FLUTICASONE PROPIONATE 50 MCG/ACT NA SUSP: NASAL | 2 refills | Status: DC

## 2018-09-13 NOTE — Progress Notes (Signed)
Assessment and Plan:  [name] was seen today for acute visit.  Diagnoses and all orders for this visit:  Seasonal allergic rhinitis, unspecified trigger Will treat for possible superimposed infection, but suspect main issue is untreated allergies Suggested symptomatic OTC remedies. Nasal saline spray for congestion. Nasal steroids, allergy pill, oral steroids offered Follow up as needed. -     fexofenadine (ALLEGRA) 180 MG tablet; Take 1 tablet (180 mg total) by mouth daily. -     predniSONE (DELTASONE) 20 MG tablet; 2 tablets daily for 3 days, 1 tablet daily for 4 days. -     azithromycin (ZITHROMAX) 250 MG tablet; Take 2 tablets (500 mg) on  Day 1,  followed by 1 tablet (250 mg) once daily on Days 2 through 5. -     fluticasone (FLONASE) 50 MCG/ACT nasal spray; 1 spray in each nostril daily  Right ear pain Suspect secondary to above; steroid taper, nasal steroid, allergy med, decongestant recommended Follow up if not improving in 2-3 days   Further disposition pending results of labs. Discussed med's effects and SE's.   Over 15 minutes of exam, counseling, chart review, and critical decision making was performed.   Future Appointments  Date Time Provider Department Center  09/27/2018  2:00 PM Lucky Cowboy, MD [id number] None    ------------------------------------------------------------------------------------------------------------------   HPI BP 124/82   Pulse 69   Temp (!) 97.5 F (36.4 C)   Ht 5' 9.5" (1.765 m)   Wt 220 lb (99.8 kg)   SpO2 93%   BMI 32.02 kg/m   58 y.o.male former smoker, sleep apnea poorly compliant with CPAP presents for 6 weeks of nasal congestion, facial pressure, then started developing sinus headache, R ear sharp pains and tenderness in neck x 3 days. He denies fever/chills. He denies blurry vision, dizziness, ear discharge. Hearing is somewhat muffled on R. He does endorse post-nasal drip. Denies sore throat, denies cough, wheezing, dyspnea,  chest pains. He does endorse sneezing and watery eyes.   He tried an unknown "sinus pill" which works in the AM but runs out in afternoon and evening  He reports typically doesn't have allergies  Past Medical History:  Diagnosis Date  . Acid reflux   . Arthritis   . Blood dyscrasia    ITP  . Bruise 08/08/11   pt states brusing x 1 month  . Chest pain 08/08/2011   "a little in last month"  . 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 Fracture of left clavicle 02/08/2015  . Hypercholesteremia   . Hypertension, Labile 11/03/2013   no meds  . Idiopathic thrombocytopenic purpura (ITP) (HCC)   . Leg cramps 08/08/2011   bad in last 1.5 months  . Morbid obesity (HCC)   . Prediabetes   . Sleep apnea    wears CPAP "sometimes"  . Tick bites 08/08/11   60 to 70 ticks in last hunting season , last > 1 month     Allergies  Allergen Reactions  . Niacin And Related Swelling    Red rash and swelling  . Gemfibrozil   . Penicillins Other (See Comments)    Unknown childhood allergy Received 2 Gms Ancef with no obvious reaction    Current Outpatient Medications on File Prior to Visit  Medication Sig  . ALPRAZolam (XANAX) 1 MG tablet Take 1/2-1 tablet 2 - 3 x /day ONLY if needed for Anxiety Attack &  limit to 5 days /week to avoid addiction  . ASPIRIN 81 PO Take by mouth daily.  . Cholecalciferol (VITAMIN D PO) Take 10,000 Int'l Units by mouth daily.   . Flaxseed, Linseed, (FLAXSEED OIL) 1200 MG CAPS Take 1 capsule by mouth daily.   . Magnesium 500 MG TABS Take by mouth daily.  . Omega-3 Fatty Acids (FISH OIL) 1200 MG CAPS Take 1 capsule by mouth daily.   . rosuvastatin (CRESTOR) 40 MG tablet TAKE 1/2 TO 1 TABLET DAILY OR AS DIRECTED FOR CHOLESTEROL  . Zinc 50 MG TABS Take by mouth daily.  Marland Kitchen amitriptyline (ELAVIL) 25 MG tablet TAKE 1/2 TO 1 TABLET BY MOUTH AT BEDTIME AS NEEDED FOR SLEEP, NERVE PAIN, & DIZZINESS  . gabapentin  (NEURONTIN) 600 MG tablet TAKE 1/2 TO 1 TABLET 1 HOUR BEFORE BEDTIME   No current facility-administered medications on file prior to visit.     ROS: all negative except above.   Physical Exam:  BP 124/82   Pulse 69   Temp (!) 97.5 F (36.4 C)   Ht 5' 9.5" (1.765 m)   Wt 220 lb (99.8 kg)   SpO2 93%   BMI 32.02 kg/m   General Appearance: Well nourished, in no apparent distress. Eyes: PERRLA, EOMs, conjunctiva no swelling or erythema Sinuses: No Frontal/maxillary tenderness ENT/Mouth: Ext aud canals clear, TMs without erythema, bulging. No erythema, swelling, or exudate on post pharynx.  Tonsils not swollen or erythematous. Hearing normal.  Neck: Supple, thyroid normal.  Respiratory: Respiratory effort normal, BS equal bilaterally without rales, rhonchi, wheezing or stridor.  Cardio: RRR with no MRGs. Brisk peripheral pulses without edema.  Abdomen: Soft, + BS.  Non tender, no guarding, rebound, hernias, masses. Lymphatics: No palpable lymphadenopathy, R upper anterior throat tenderness over eustachian tube  Musculoskeletal: Symmetrical strength, normal gait.   Skin: Warm, dry without rashes, lesions, ecchymosis.  Neuro: Cranial nerves intact. Normal muscle tone, no cerebellar symptoms. Psych: Awake and oriented X 3, normal affect, Insight and Judgment appropriate.     Dan Maker, NP 3:49 PM [facility]

## 2018-09-13 NOTE — Patient Instructions (Addendum)
   Check to see what the "sinus medication" is -    Medicines you can use  Nasal congestion  Little Remedies saline spray (aerosol/mist)- can try this, it is in the kids section - pseudoephedrine (Sudafed)- behind the counter, do not use if you have high blood pressure, medicine that have -D in them.  - phenylephrine (Sudafed PE) -Dextormethorphan + chlorpheniramine (Coridcidin HBP)- okay if you have high blood pressure -Oxymetazoline (Afrin) nasal spray- LIMIT to 3 days -Saline nasal spray -Neti pot (used distilled or bottled water)  Ear pain/congestion  -pseudoephedrine (sudafed) - Nasonex/flonase nasal spray  Fever  -Acetaminophen (Tyelnol) -Ibuprofen (Advil, motrin, aleve)  Sore Throat  -Acetaminophen (Tyelnol) -Ibuprofen (Advil, motrin, aleve) -Drink a lot of water -Gargle with salt water - Rest your voice (don't talk) -Throat sprays -Cough drops  Body Aches  -Acetaminophen (Tyelnol) -Ibuprofen (Advil, motrin, aleve)  Headache  -Acetaminophen (Tyelnol) -Ibuprofen (Advil, motrin, aleve) - Exedrin, Exedrin Migraine  Allergy symptoms (cough, sneeze, runny nose, itchy eyes) -Claritin or loratadine cheapest but likely the weakest  -Zyrtec or certizine at night because it can make you sleepy -The strongest is allegra or fexafinadine  Cheapest at [employer], [employer], [employer]  Cough  -Dextromethorphan (Delsym)- medicine that has DM in it -Guafenesin (Mucinex/Robitussin) - cough drops - drink lots of water  Chest Congestion  -Guafenesin (Mucinex/Robitussin)  Red Itchy Eyes  - Naphcon-A  Upset Stomach  - Bland diet (nothing spicy, greasy, fried, and high acid foods like tomatoes, oranges, berries) -OKAY- cereal, bread, soup, crackers, rice -Eat smaller more frequent meals -reduce caffeine, no alcohol -Loperamide (Imodium-AD) if diarrhea -Prevacid for heart burn  General health when sick  -Hydration -wash your hands frequently -keep surfaces clean -change  pillow cases and sheets often -Get fresh air but do not exercise strenuously -Vitamin D, double up on it - Vitamin C -Zinc

## 2018-09-24 ENCOUNTER — Other Ambulatory Visit: Payer: Self-pay | Admitting: Internal Medicine

## 2018-09-24 DIAGNOSIS — E782 Mixed hyperlipidemia: Secondary | ICD-10-CM

## 2018-09-24 DIAGNOSIS — F419 Anxiety disorder, unspecified: Secondary | ICD-10-CM

## 2018-09-24 MED ORDER — ALPRAZOLAM 1 MG PO TABS: ORAL_TABLET | ORAL | 0 refills | Status: DC

## 2018-09-24 MED ORDER — ROSUVASTATIN CALCIUM 40 MG PO TABS: ORAL_TABLET | ORAL | 0 refills | Status: DC

## 2018-09-27 ENCOUNTER — Encounter: Payer: Self-pay | Admitting: Internal Medicine

## 2018-11-28 ENCOUNTER — Other Ambulatory Visit: Payer: Self-pay | Admitting: Adult Health

## 2018-12-01 ENCOUNTER — Other Ambulatory Visit: Payer: Self-pay | Admitting: Adult Health

## 2019-10-19 ENCOUNTER — Encounter: Payer: Self-pay | Admitting: Internal Medicine

## 2020-01-02 ENCOUNTER — Other Ambulatory Visit: Payer: Self-pay | Admitting: Internal Medicine

## 2020-09-05 ENCOUNTER — Ambulatory Visit (INDEPENDENT_AMBULATORY_CARE_PROVIDER_SITE_OTHER): Payer: 59 | Admitting: Internal Medicine

## 2020-09-05 ENCOUNTER — Other Ambulatory Visit: Payer: Self-pay

## 2020-09-05 ENCOUNTER — Encounter: Payer: Self-pay | Admitting: Internal Medicine

## 2020-09-05 ENCOUNTER — Other Ambulatory Visit: Payer: Self-pay | Admitting: *Deleted

## 2020-09-05 VITALS — BP 128/84 | HR 73 | Temp 97.2°F | Resp 16 | Ht 69.5 in | Wt 228.0 lb

## 2020-09-05 DIAGNOSIS — M2550 Pain in unspecified joint: Secondary | ICD-10-CM

## 2020-09-05 DIAGNOSIS — E782 Mixed hyperlipidemia: Secondary | ICD-10-CM

## 2020-09-05 DIAGNOSIS — R7309 Other abnormal glucose: Secondary | ICD-10-CM

## 2020-09-05 DIAGNOSIS — I10 Essential (primary) hypertension: Secondary | ICD-10-CM

## 2020-09-05 DIAGNOSIS — E559 Vitamin D deficiency, unspecified: Secondary | ICD-10-CM

## 2020-09-05 DIAGNOSIS — Z79899 Other long term (current) drug therapy: Secondary | ICD-10-CM

## 2020-09-05 DIAGNOSIS — G540 Brachial plexus disorders: Secondary | ICD-10-CM

## 2020-09-05 MED ORDER — GABAPENTIN 300 MG PO CAPS: ORAL_CAPSULE | ORAL | 2 refills | Status: DC

## 2020-09-05 MED ORDER — FLUTICASONE PROPIONATE 50 MCG/ACT NA SUSP: NASAL | 3 refills | Status: DC

## 2020-09-05 MED ORDER — DEXAMETHASONE 4 MG PO TABS: ORAL_TABLET | ORAL | 0 refills | Status: DC

## 2020-09-05 NOTE — Progress Notes (Signed)
History of Present Illness:        This very nice 60 y.o.  [name] returns for follow-up after changing insurance from the [facility] presents for 3 month follow up with HTN, HLD, Pre-Diabetes and Vitamin D Deficiency. Patient has OSA &uses his CPAP sporadically.        Today, patient is also c/o bilat radicular type pains (T7 & T8) dermatomes which he describes as like "electric shocks" . He also is c/o pains of his Rt wrist & bilat ankles.        Patient has hx/o labile HTN  followed expectantly & BP has been controlled at home. Today's BP  Is at goal - 128/84. Patient has had no complaints of any cardiac type chest pain, palpitations, dyspnea / orthopnea / PND, dizziness, claudication, or dependent edema.       Hyperlipidemia is controlled with diet & Rosuvastatin Patient denies myalgias or other med SE's. Last Lipids were   Lab Results  Component Value Date   CHOL 102 03/11/2018   HDL 27 (L) 03/11/2018   LDLCALC 52 03/11/2018   TRIG 157 (H) 03/11/2018   CHOLHDL 3.8 03/11/2018    Also, the patient has history of PreDiabetes  (A1c 5.7%/2012)  and has had no symptoms of reactive hypoglycemia, diabetic polys, paresthesias or visual blurring.  Last A1c was at goal:  Lab Results  Component Value Date   HGBA1C 5.6 03/11/2018       Further, the patient also has history of Vitamin D Deficiency ("20"/2011) and supplements vitamin D without any suspected side-effects. Last vitamin D was at goal:  Lab Results  Component Value Date   VD25OH 78 03/11/2018    Current Outpatient Medications on File Prior to Visit  Medication Sig  . ALPRAZolam  1 MG tablet Take 1/2-1 tablet 2 - 3 x /day   . ASPIRIN 81 mg Take  daily.  Marland Kitchen VITAMIN D  Take 10,000 Int'l Units  daily.   . fexofenadine 180 MG tablet Take 1 tablet Daily for Allergies  . FLAXSEED OIL 1200 MG  Take 1 capsule  daily.   Marland Kitchen FLONASE  nasal spray Use 1 to 2 sprays each Nostril 1 to 2 x /day  . Magnesium 500 MG TABS Take by  mouth daily.  . Omega-3 FISH OIL 1200 MG  Take 1 capsule  daily.   . rosuvastatin  40 MG tablet Take 1 tablet daily  for Cholesterol  . Zinc 50 MG TABS Takedaily.    Allergies  Allergen Reactions  . Niacin And Related Swelling    Red rash and swelling  . Gemfibrozil   . Penicillins Other (See Comments)    Unknown childhood allergy Received 2 Gms Ancef with no obvious reaction    PMHx:   Past Medical History:  Diagnosis Date  . Acid reflux   . Arthritis   . Blood dyscrasia    ITP  . Bruise 08/08/11   pt states brusing x 1 month  . Chest pain 08/08/2011   "a little in last month"  . 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 Fracture of left clavicle 02/08/2015  . Hypercholesteremia   . Hypertension, Labile 11/03/2013   no meds  . Idiopathic thrombocytopenic purpura (ITP) (HCC)   . Leg cramps 08/08/2011   bad in last 1.5 months  . Morbid obesity (HCC)   . Prediabetes   .  Sleep apnea    wears CPAP "sometimes"  . Tick bites 08/08/11   60 to 70 ticks in last hunting season , last > 1 month    Immunization History  Administered Date(s) Administered  . Influenza Inj Mdck Quad Pf 05/19/2018  . Influenza Split 05/24/2013, 06/22/2014, 04/18/2017  . Influenza, Seasonal, Injecte, Preservative Fre 07/03/2015  . Influenza,inj,Quad PF,6+ Mos 07/06/2017, 05/10/2019  . Influenza,inj,quad, With Preservative 08/07/2016  . Influenza-Unspecified 05/19/2018  . PPD Test 07/03/2015, 08/07/2016, 09/09/2017  . Pneumococcal Polysaccharide-23 05/19/2012  . Td 08/18/2008, 02/04/2015    Past Surgical History:  Procedure Laterality Date  . BACK SURGERY  07/2010   lumbar - 3 discs  . FRACTURE SURGERY     right foot fracture, no surgery  . LUMBAR DISC SURGERY  2011  . ORIF CLAVICULAR FRACTURE Left 02/08/2015   Procedure: OPEN REDUCTION INTERNAL FIXATION (ORIF) LEFT CLAVICULAR FRACTURE;  Surgeon: Teryl Lucy, MD;  Location:  [facility];  Service: Orthopedics;  Laterality: Left;    FHx:    Reviewed / unchanged  SHx:    Reviewed / unchanged   Systems Review:  Constitutional: Denies fever, chills, wt changes, headaches, insomnia, fatigue, night sweats, change in appetite. Eyes: Denies redness, blurred vision, diplopia, discharge, itchy, watery eyes.  ENT: Denies discharge, congestion, post nasal drip, epistaxis, sore throat, earache, hearing loss, dental pain, tinnitus, vertigo, sinus pain, snoring.  CV: Denies chest pain, palpitations, irregular heartbeat, syncope, dyspnea, diaphoresis, orthopnea, PND, claudication or edema. Respiratory: denies cough, dyspnea, DOE, pleurisy, hoarseness, laryngitis, wheezing.  Gastrointestinal: Denies dysphagia, odynophagia, heartburn, reflux, water brash, abdominal pain or cramps, nausea, vomiting, bloating, diarrhea, constipation, hematemesis, melena, hematochezia  or hemorrhoids. Genitourinary: Denies dysuria, frequency, urgency, nocturia, hesitancy, discharge, hematuria or flank pain. Musculoskeletal: Denies arthralgias, myalgias, stiffness, jt. swelling, pain, limping or strain/sprain.  Skin: Denies pruritus, rash, hives, warts, acne, eczema or change in skin lesion(s). Neuro: No weakness, tremor, incoordination, spasms, paresthesia or pain. Psychiatric: Denies confusion, memory loss or sensory loss. Endo: Denies change in weight, skin or hair change.  Heme/Lymph: No excessive bleeding, bruising or enlarged lymph nodes.  Physical Exam  BP 128/84   Pulse 73   Temp (!) 97.2 F (36.2 C)   Resp 16   Ht 5' 9.5" (1.765 m)   Wt 228 lb (103.4 kg)   SpO2 96%   BMI 33.19 kg/m   Appears  well nourished, well groomed  and in no distress.  Eyes: PERRLA, EOMs, conjunctiva no swelling or erythema. Sinuses: No frontal/maxillary tenderness ENT/Mouth: EAC's clear, TM's nl w/o erythema, bulging. Nares clear w/o erythema, swelling, exudates. Oropharynx clear without  erythema or exudates. Oral hygiene is good. Tongue normal, non obstructing. Hearing intact.  Neck: Supple. Thyroid not palpable. Car 2+/2+ without bruits, nodes or JVD. Chest: Respirations nl with BS clear & equal w/o rales, rhonchi, wheezing or stridor.  Cor: Heart sounds normal w/ regular rate and rhythm without sig. murmurs, gallops, clicks or rubs. Peripheral pulses normal and equal  without edema.  Abdomen: Soft & bowel sounds normal. Non-tender w/o guarding, rebound, hernias, masses or organomegaly.  Lymphatics: Unremarkable.  Musculoskeletal: Full ROM all peripheral extremities, joint stability, 5/5 strength and normal gait.  Skin: Warm, dry without exposed rashes, lesions or ecchymosis apparent.  Neuro: Cranial nerves intact, reflexes equal bilaterally. Sensory-motor testing grossly intact. Tendon reflexes grossly intact.  Pysch: Alert & oriented x 3.  Insight and judgement nl & appropriate. No ideations.  Assessment and Plan:  1. Essential hypertension  -  Continue medication, monitor blood pressure at home.  - Continue DASH diet.  Reminder to go to the ER if any CP,  SOB, nausea, dizziness, severe HA, changes vision/speech.  - CBC with Differential/Platelet - COMPLETE METABOLIC PANEL WITH GFR - Magnesium - TSH  2. Hyperlipidemia, mixed  - Continue diet/meds, exercise,& lifestyle modifications.  - Continue monitor periodic cholesterol/liver & renal functions   - Lipid panel  3. Abnormal glucose  - Continue diet, exercise  - Lifestyle modifications.  - Monitor appropriate labs.  - Hemoglobin A1c - Insulin, random  4. Vitamin D deficiency  - Continue supplementation.  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  Dispense: 90 capsule; Refill: 2  6. Arthralgia, unspecified joint  - dexamethasone  4 MG tablet;  Take 1 tab 3 x day - 3  days, then 2 x day - 3 days,  then 1 tab daily  Dispense: 20 tablet  7. Medication managemen - CBC with Differential/Platelet - COMPLETE METABOLIC PANEL WITH GFR - Magnesium - Lipid panel - TSH - Hemoglobin A1c - Insulin, random - VITAMIN D 25 Hydroxy       Discussed  regular exercise, BP monitoring, weight control to achieve/maintain BMI less than 25 and discussed med and SE's. Recommended labs to assess and monitor clinical status with further disposition pending results of labs.  I discussed the assessment and treatment plan with the patient. The patient was provided an opportunity to ask questions and all were answered. The patient agreed with the plan and demonstrated an understanding of the instructions.  I provided over 30 minutes of exam, counseling, chart review and  complex critical decision making.         The patient was advised to call back or seek an in-person evaluation if the symptoms worsen or if the condition fails to improve as anticipated.   Marinus Maw, MD

## 2020-09-05 NOTE — Patient Instructions (Signed)

## 2020-09-06 LAB — LIPID PANEL
Cholesterol: 108 mg/dL (ref <200)
HDL: 28 mg/dL — ABNORMAL LOW (ref >=40)
LDL Cholesterol (Calc): 54 mg/dL (calc)
Non-HDL Cholesterol (Calc): 80 mg/dL (calc) (ref <130)
Total CHOL/HDL Ratio: 3.9 (calc) (ref <5.0)
Triglycerides: 179 mg/dL — ABNORMAL HIGH (ref <150)

## 2020-09-06 LAB — COMPLETE METABOLIC PANEL WITH GFR
AG Ratio: 1.9 (calc) (ref 1.0–2.5)
ALT: 18 U/L (ref 9–46)
AST: 18 U/L (ref 10–35)
Albumin: 4.7 g/dL (ref 3.6–5.1)
Alkaline phosphatase (APISO): 100 U/L (ref 35–144)
BUN: 13 mg/dL (ref 7–25)
CO2: 25 mmol/L (ref 20–32)
Calcium: 9.5 mg/dL (ref 8.6–10.3)
Chloride: 103 mmol/L (ref 98–110)
Creat: 1.02 mg/dL (ref 0.70–1.25)
GFR, Est African American: 92 mL/min/{1.73_m2} (ref >=60)
GFR, Est Non African American: 80 mL/min/{1.73_m2} (ref >=60)
Globulin: 2.5 g/dL (calc) (ref 1.9–3.7)
Glucose, Bld: 90 mg/dL (ref 65–99)
Potassium: 4.1 mmol/L (ref 3.5–5.3)
Sodium: 137 mmol/L (ref 135–146)
Total Bilirubin: 0.4 mg/dL (ref 0.2–1.2)
Total Protein: 7.2 g/dL (ref 6.1–8.1)

## 2020-09-06 LAB — CBC WITH DIFFERENTIAL/PLATELET
Absolute Monocytes: 632 cells/uL (ref 200–950)
Basophils Absolute: 71 cells/uL (ref 0–200)
Basophils Relative: 0.7 %
Eosinophils Absolute: 255 cells/uL (ref 15–500)
Eosinophils Relative: 2.5 %
HCT: 45.5 % (ref 38.5–50.0)
Hemoglobin: 15.5 g/dL (ref 13.2–17.1)
Lymphs Abs: 3468 cells/uL (ref 850–3900)
MCH: 29.9 pg (ref 27.0–33.0)
MCHC: 34.1 g/dL (ref 32.0–36.0)
MCV: 87.8 fL (ref 80.0–100.0)
MPV: 10.3 fL (ref 7.5–12.5)
Monocytes Relative: 6.2 %
Neutro Abs: 5773 cells/uL (ref 1500–7800)
Neutrophils Relative %: 56.6 %
Platelets: 196 10*3/uL (ref 140–400)
RBC: 5.18 10*6/uL (ref 4.20–5.80)
RDW: 12.1 % (ref 11.0–15.0)
Total Lymphocyte: 34 %
WBC: 10.2 10*3/uL (ref 3.8–10.8)

## 2020-09-06 LAB — INSULIN, RANDOM: Insulin: 8.3 u[IU]/mL

## 2020-09-06 LAB — MAGNESIUM: Magnesium: 2.3 mg/dL (ref 1.5–2.5)

## 2020-09-06 LAB — TSH: TSH: 2.23 mIU/L (ref 0.40–4.50)

## 2020-09-06 LAB — HEMOGLOBIN A1C
Hgb A1c MFr Bld: 5.6 % of total Hgb (ref <5.7)
Mean Plasma Glucose: 114 mg/dL
eAG (mmol/L): 6.3 mmol/L

## 2020-09-06 LAB — VITAMIN D 25 HYDROXY (VIT D DEFICIENCY, FRACTURES): Vit D, 25-Hydroxy: 81 ng/mL (ref 30–100)

## 2020-09-08 NOTE — Progress Notes (Signed)
========================================================== ==========================================================  -    Total Chol = 108     &   LDL Chol = 54    Both  Excellent   - Very low risk for Heart Attack  / Stroke ==========================================================  - Recommend DECREASE your Rosuvastatin /Crestor 40 mg dose                                                                                         to 1/2 tablet (20 mg)  ========================================================== ==========================================================  -  A1c - Normal - Great    -     No Diabetes  ! ========================================================== ==========================================================  -  Vitamin D = 1   - Excellent  ========================================================== ==========================================================  - All Else - CBC - Kidneys - Electrolytes - Liver - Magnesium & Thyroid    - all  Normal / OK ==========================================================  -  Keep up the [place] Work  ! ========================================================== ==========================================================

## 2020-09-19 NOTE — Progress Notes (Signed)
   History of Present Illness:                                                   This very nice 60 y.o.  [name] with HTN, HLD, Pre-Diabetes, OSA and Vit D Deficiency returns for 2 week f/u re: shoulder pains & numb paresthesias of his UE. He was treated with a Decadron taper which he d/c'd due to GERD sx's and in lieu of the prescribed Gabapentin 300 mg caps, he had  An old Rx of 600 mg tabs which he takes 1/4 tab  (=150 mg )    Medications   .  rosuvastatin 40 MG tablet, Take 1 tablet daily    .  FLONASE 50 MCG/ACT nasal spray, Use 1 to 2 sprays each Nostril 1 to 2 x /day  .  ASPIRIN 81 PO, Take  Daily.  .  Cholecalciferol (VITAMIN D PO), Take 10,000 Int'l Units by mouth daily.  .  Flaxseed, Linseed, (FLAXSEED OIL) 1200 MG CAPS, Take 1 capsule by mouth daily.   Marland Kitchen  gabapentin (NEURONTIN) 300 MG capsule, Take    1 capsule    3 x /day     for Neuropathy pains.  .  Magnesium 500 MG TABS, Take by mouth daily. .  Omega-3 Fatty Acids (FISH OIL) 1200 MG CAPS, Take 1 capsule by mouth daily.  .  Zinc 50 MG TABS, Take by mouth daily.  Problem list He has Morbid obesity (HCC); Hyperlipidemia, mixed; GERD; Prediabetes; Idiopathic thrombocytopenic purpura (ITP) (HCC); Essential hypertension; Vitamin D deficiency; Medication management; Thrombocytopenia (HCC); and Vertigo on their problem list.   Observations/Objective:  BP 114/74   Pulse 64   Temp (!) 97.2 F (36.2 C)   Resp 16   Ht 5' 9.5" (1.765 m)   Wt 235 lb (106.6 kg)   SpO2 96%   BMI 34.21 kg/m   HEENT - WNL. Neck - supple.  Chest - Clear equal BS. Cor - Nl HS. RRR w/o sig MGR. PP 1(+). No edema. MS- FROM w/o deformities.  Gait Nl. Neuro -  Nl w/o focal abnormalities.  Assessment and Plan:  1. 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 Arthralgia of shoulder, unspecified laterality    Advised to continue to use Gabapentin as needed    Follow Up Instructions:        I discussed the assessment and treatment  plan with the patient. The patient was provided an opportunity to ask questions and all were answered. The patient agreed with the plan and demonstrated an understanding of the instructions.       The patient was advised to call back or seek an in-person evaluation if the symptoms worsen or if the condition fails to improve as anticipated.    Marinus Maw, MD

## 2020-09-20 ENCOUNTER — Other Ambulatory Visit: Payer: Self-pay

## 2020-09-20 ENCOUNTER — Ambulatory Visit (INDEPENDENT_AMBULATORY_CARE_PROVIDER_SITE_OTHER): Payer: 59 | Admitting: Internal Medicine

## 2020-09-20 VITALS — BP 114/74 | HR 64 | Temp 97.2°F | Resp 16 | Ht 69.5 in | Wt 235.0 lb

## 2020-09-20 DIAGNOSIS — G540 Brachial plexus disorders: Secondary | ICD-10-CM

## 2020-09-20 DIAGNOSIS — M25519 Pain in unspecified shoulder: Secondary | ICD-10-CM | POA: Diagnosis not present

## 2020-09-23 ENCOUNTER — Encounter: Payer: Self-pay | Admitting: Internal Medicine

## 2020-12-01 ENCOUNTER — Other Ambulatory Visit: Payer: Self-pay | Admitting: Internal Medicine

## 2020-12-01 DIAGNOSIS — G540 Brachial plexus disorders: Secondary | ICD-10-CM

## 2020-12-06 ENCOUNTER — Ambulatory Visit (INDEPENDENT_AMBULATORY_CARE_PROVIDER_SITE_OTHER): Payer: 59 | Admitting: Adult Health

## 2020-12-06 ENCOUNTER — Ambulatory Visit: Payer: 59 | Admitting: Adult Health Nurse Practitioner

## 2020-12-06 ENCOUNTER — Other Ambulatory Visit: Payer: Self-pay

## 2020-12-06 ENCOUNTER — Encounter: Payer: Self-pay | Admitting: Adult Health

## 2020-12-06 VITALS — BP 112/80 | HR 72 | Temp 96.4°F | Wt 238.0 lb

## 2020-12-06 DIAGNOSIS — G4733 Obstructive sleep apnea (adult) (pediatric): Secondary | ICD-10-CM | POA: Insufficient documentation

## 2020-12-06 DIAGNOSIS — R7303 Prediabetes: Secondary | ICD-10-CM

## 2020-12-06 DIAGNOSIS — I10 Essential (primary) hypertension: Secondary | ICD-10-CM | POA: Diagnosis not present

## 2020-12-06 DIAGNOSIS — E782 Mixed hyperlipidemia: Secondary | ICD-10-CM | POA: Diagnosis not present

## 2020-12-06 DIAGNOSIS — E559 Vitamin D deficiency, unspecified: Secondary | ICD-10-CM

## 2020-12-06 DIAGNOSIS — G4700 Insomnia, unspecified: Secondary | ICD-10-CM

## 2020-12-06 DIAGNOSIS — Z79899 Other long term (current) drug therapy: Secondary | ICD-10-CM

## 2020-12-06 MED ORDER — ALPRAZOLAM 1 MG PO TABS: ORAL_TABLET | ORAL | 0 refills | Status: DC

## 2020-12-06 NOTE — Patient Instructions (Signed)
    Miralax for softener -  If soft but not moving try senokot

## 2020-12-06 NOTE — Progress Notes (Signed)
Assessment and Plan:    Essential hypertension - continue medications, DASH diet, exercise and monitor at home. Call if greater than 130/80.  -     CBC with Differential/Platelet -     COMPLETE METABOLIC PANEL WITH GFR -     TSH  Abnormal glucose Recent A1Cs at goal Discussed diet/exercise, weight management  Defer A1C; check CMP  Morbid obesity (HCC) - BMI 30+ with OSA - follow up 3 months for progress monitoring - increase veggies, decrease carbs - long discussion about weight loss, diet, and exercise  Hyperlipidemia, mixed -continue medications, check lipids, decrease fatty foods, increase activity.  -     Lipid panel  Medication management -     Magnesium  OSA Needs supplies, needs repeat study as last remove Will refer to Dr. Vickey Huger.   Continue diet and meds as discussed. Further disposition pending results of labs.  Future Appointments  Date Time Provider Department Center  12/06/2020  1:45 PM Judd Gaudier, NP [facility] None  04/12/2021 11:00 AM Lucky Cowboy, MD [facility] None     HPI 61 y.o. male  presents for 3 month follow up with hypertension, hyperlipidemia, prediabetes and vitamin D.  He has been taking gabapentin for back radiculopathy, taking 300 mg TID PRN which works well for him, notes constipation with this but managing with stool softener. Tylenol doesn't help.   Notes ongoing difficulty with sleep, has tried gabapentin, trazodone, diphenhydramine. Xanax has been only med that helps thus far, without can only sleep 2-3 hours. Aware of tolerance risks and need to limit <5 days/week.   Hx of OSA, has CPAP but no supplies, last sleep study remote, has since lost a lot of weight, receptive to referral to have repeated.   BMI is There is no height or weight on file to calculate BMI., he has not been working on diet and exercise. Wt Readings from Last 3 Encounters:  09/20/20 235 lb (106.6 kg)  09/05/20 228 lb (103.4 kg)  09/13/18 220 lb (99.8  kg)    His blood pressure has been controlled at home, today their BP is  .   He does workout. He denies chest pain, shortness of breath, dizziness.  He reports that he has been getting a lot of walking in.  He has been walking every where he goes.     He is on cholesterol medication, he is on crestor 1 pill a day and denies myalgias. His cholesterol is at goal. The cholesterol last visit was:   Lab Results  Component Value Date   CHOL 108 09/05/2020   HDL 28 (L) 09/05/2020   LDLCALC 54 09/05/2020   TRIG 179 (H) 09/05/2020   CHOLHDL 3.9 09/05/2020    He has been working on diet and exercise for hx of prediabetes, and denies foot ulcerations, hyperglycemia, hypoglycemia , increased appetite, nausea, paresthesia of the feet, polydipsia, polyuria, visual disturbances, vomiting and weight loss. Last A1C in the office was:  Lab Results  Component Value Date   HGBA1C 5.6 09/05/2020   Patient is on Vitamin D supplement.  Lab Results  Component Value Date   VD25OH 67 09/05/2020      Current Medications:  Current Outpatient Medications on File Prior to Visit  Medication Sig Dispense Refill  . ASPIRIN 81 PO Take by mouth daily.    . Cholecalciferol (VITAMIN D PO) Take 10,000 Int'l Units by mouth daily.     . Flaxseed, Linseed, (FLAXSEED OIL) 1200 MG CAPS Take 1 capsule by  mouth daily.     . fluticasone (FLONASE) 50 MCG/ACT nasal spray Use 1 to 2 sprays each Nostril 1 to 2 x /day 48 g 3  . gabapentin (NEURONTIN) 300 MG capsule Take  1 capsule  3 x /day  as needed for Neuropathy Pains 270 capsule 1  . Magnesium 500 MG TABS Take by mouth daily.    . Omega-3 Fatty Acids (FISH OIL) 1200 MG CAPS Take 1 capsule by mouth daily.     . rosuvastatin (CRESTOR) 40 MG tablet Take 1 tablet daily  for Cholesterol 90 tablet 0  . Zinc 50 MG TABS Take by mouth daily.     No current facility-administered medications on file prior to visit.    Medical History:  Past Medical History:  Diagnosis Date  .  Acid reflux   . Arthritis   . Blood dyscrasia    ITP  . Bruise 08/08/11   pt states brusing x 1 month  . Chest pain 08/08/2011   "a little in last month"  . 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 Fracture of left clavicle 02/08/2015  . Hypercholesteremia   . Hypertension, Labile 11/03/2013   no meds  . Idiopathic thrombocytopenic purpura (ITP) (HCC)   . Leg cramps 08/08/2011   bad in last 1.5 months  . Morbid obesity (HCC)   . Prediabetes   . Sleep apnea    wears CPAP "sometimes"  . Tick bites 08/08/11   60 to 70 ticks in last hunting season , last > 1 month    Allergies:  Allergies  Allergen Reactions  . Niacin And Related Swelling    Red rash and swelling  . Gemfibrozil   . Penicillins Other (See Comments)    Unknown childhood allergy Received 2 Gms Ancef with no obvious reaction      Review of Systems:  Review of Systems  Constitutional: Negative for chills, fever and malaise/fatigue.  HENT: Negative for congestion, ear pain and sore throat.   Eyes: Negative for blurred vision.  Respiratory: Negative for cough, shortness of breath and wheezing.   Cardiovascular: Negative for chest pain, palpitations and leg swelling.  Gastrointestinal: Negative for abdominal pain, blood in stool, constipation, diarrhea, heartburn and melena.  Genitourinary: Negative.   Musculoskeletal: Positive for back pain and neck pain. Negative for myalgias.  Neurological: Negative for dizziness, tingling, tremors, sensory change, speech change, focal weakness, seizures, loss of consciousness and headaches.  Psychiatric/Behavioral: Negative for depression. The patient has insomnia. The patient is not nervous/anxious.    Allergies Allergies  Allergen Reactions  . Niacin And Related Swelling    Red rash and swelling  . Gemfibrozil   . Penicillins Other (See Comments)    Unknown childhood allergy Received 2 Gms Ancef with no  obvious reaction    SURGICAL HISTORY He  has a past surgical history that includes Back surgery (07/2010); Lumbar disc surgery (2011); Fracture surgery; and ORIF clavicular fracture (Left, 02/08/2015). FAMILY HISTORY His family history includes Uterine cancer in his mother and sister. SOCIAL HISTORY He  reports that he quit smoking about 5 years ago. His smoking use included cigarettes. He has a 8.00 pack-year smoking history. His smokeless tobacco use includes chew. He reports current alcohol use. He reports that he does not use drugs.  Physical Exam: There were no vitals taken for this visit. Wt Readings from Last 3 Encounters:  09/20/20 235 lb (106.6  kg)  09/05/20 228 lb (103.4 kg)  09/13/18 220 lb (99.8 kg)   General Appearance: Well nourished well developed, in no apparent distress. Eyes: PERRLA, EOMs, conjunctiva no swelling or erythema ENT/Mouth: Ear canals normal without obstruction, swelling, erythma, discharge.  TMs normal bilaterally.  Oropharynx moist, clear, without exudate, or postoropharyngeal swelling. Neck: Supple, thyroid normal,no cervical adenopathy  Respiratory: Respiratory effort normal, Breath sounds clear A&P without rhonchi, wheeze, or rale.  No retractions, no accessory usage. Cardio: RRR with no MRGs. Brisk peripheral pulses without edema.  Abdomen: Soft, + BS,  Non tender, no guarding, rebound, hernias, masses. Musculoskeletal: Full ROM, 5/5 strength, Normal gait Skin: Warm, dry without rashes, lesions, ecchymosis.  Neuro: Awake and oriented X 3, Cranial nerves intact. Normal muscle tone, no cerebellar symptoms. Psych: Normal affect, Insight and Judgment appropriate.    Dan Maker, NP 9:10 AM [facility]

## 2020-12-07 LAB — CBC WITH DIFFERENTIAL/PLATELET
Absolute Monocytes: 749 cells/uL (ref 200–950)
Basophils Absolute: 54 cells/uL (ref 0–200)
Basophils Relative: 0.5 %
Eosinophils Absolute: 310 cells/uL (ref 15–500)
Eosinophils Relative: 2.9 %
HCT: 46.8 % (ref 38.5–50.0)
Hemoglobin: 15.4 g/dL (ref 13.2–17.1)
Lymphs Abs: 3296 cells/uL (ref 850–3900)
MCH: 29.2 pg (ref 27.0–33.0)
MCHC: 32.9 g/dL (ref 32.0–36.0)
MCV: 88.6 fL (ref 80.0–100.0)
MPV: 10.5 fL (ref 7.5–12.5)
Monocytes Relative: 7 %
Neutro Abs: 6292 cells/uL (ref 1500–7800)
Neutrophils Relative %: 58.8 %
Platelets: 193 10*3/uL (ref 140–400)
RBC: 5.28 10*6/uL (ref 4.20–5.80)
RDW: 12.6 % (ref 11.0–15.0)
Total Lymphocyte: 30.8 %
WBC: 10.7 10*3/uL (ref 3.8–10.8)

## 2020-12-07 LAB — COMPLETE METABOLIC PANEL WITH GFR
AG Ratio: 2 (calc) (ref 1.0–2.5)
ALT: 20 U/L (ref 9–46)
AST: 18 U/L (ref 10–35)
Albumin: 4.5 g/dL (ref 3.6–5.1)
Alkaline phosphatase (APISO): 90 U/L (ref 35–144)
BUN: 11 mg/dL (ref 7–25)
CO2: 27 mmol/L (ref 20–32)
Calcium: 9.4 mg/dL (ref 8.6–10.3)
Chloride: 102 mmol/L (ref 98–110)
Creat: 0.99 mg/dL (ref 0.70–1.25)
GFR, Est African American: 96 mL/min/{1.73_m2} (ref >=60)
GFR, Est Non African American: 82 mL/min/{1.73_m2} (ref >=60)
Globulin: 2.3 g/dL (calc) (ref 1.9–3.7)
Glucose, Bld: 84 mg/dL (ref 65–99)
Potassium: 4.5 mmol/L (ref 3.5–5.3)
Sodium: 138 mmol/L (ref 135–146)
Total Bilirubin: 0.5 mg/dL (ref 0.2–1.2)
Total Protein: 6.8 g/dL (ref 6.1–8.1)

## 2020-12-07 LAB — TSH: TSH: 1.44 mIU/L (ref 0.40–4.50)

## 2020-12-07 LAB — LIPID PANEL
Cholesterol: 117 mg/dL (ref <200)
HDL: 26 mg/dL — ABNORMAL LOW (ref >=40)
LDL Cholesterol (Calc): 61 mg/dL (calc)
Non-HDL Cholesterol (Calc): 91 mg/dL (calc) (ref <130)
Total CHOL/HDL Ratio: 4.5 (calc) (ref <5.0)
Triglycerides: 257 mg/dL — ABNORMAL HIGH (ref <150)

## 2020-12-07 LAB — MAGNESIUM: Magnesium: 2.2 mg/dL (ref 1.5–2.5)

## 2021-01-29 ENCOUNTER — Encounter: Payer: Self-pay | Admitting: Neurology

## 2021-01-29 ENCOUNTER — Ambulatory Visit: Payer: 59 | Admitting: Neurology

## 2021-01-29 VITALS — BP 129/78 | HR 64 | Ht 70.2 in | Wt 230.0 lb

## 2021-01-29 DIAGNOSIS — F5104 Psychophysiologic insomnia: Secondary | ICD-10-CM | POA: Diagnosis not present

## 2021-01-29 DIAGNOSIS — Z8659 Personal history of other mental and behavioral disorders: Secondary | ICD-10-CM | POA: Diagnosis not present

## 2021-01-29 DIAGNOSIS — G4719 Other hypersomnia: Secondary | ICD-10-CM | POA: Insufficient documentation

## 2021-01-29 DIAGNOSIS — G4733 Obstructive sleep apnea (adult) (pediatric): Secondary | ICD-10-CM | POA: Insufficient documentation

## 2021-01-29 DIAGNOSIS — R413 Other amnesia: Secondary | ICD-10-CM

## 2021-01-29 DIAGNOSIS — F422 Mixed obsessional thoughts and acts: Secondary | ICD-10-CM | POA: Diagnosis not present

## 2021-01-29 DIAGNOSIS — J449 Chronic obstructive pulmonary disease, unspecified: Secondary | ICD-10-CM

## 2021-01-29 MED ORDER — QUETIAPINE FUMARATE 25 MG PO TABS: ORAL_TABLET | ORAL | 3 refills | Status: DC
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
## 2021-01-29 NOTE — Patient Instructions (Signed)
Quetiapine tablets What is this medication? QUETIAPINE (kwe TYE a peen) is an antipsychotic. It is used to treatschizophrenia and bipolar disorder, also known as manic-depression. This medicine may be used for other purposes; ask your health care provider orpharmacist if you have questions. COMMON BRAND NAME(S): Seroquel What should I tell my care team before I take this medication? They need to know if you have any of these conditions: blockage in your bowel cataracts dementia diabetes difficulty swallowing glaucoma heart disease high levels of prolactin history of breast cancer history of irregular heartbeat liver disease low blood counts, like low white cell, platelet, or red cell counts low blood pressure Parkinson's disease prostate disease seizures suicidal thoughts, plans or attempt; a previous suicide attempt by you or a family member thyroid disease trouble passing urine an unusual or allergic reaction to quetiapine, other medicines, foods, dyes, or preservatives pregnant or trying to get pregnant breast-feeding How should I use this medication? Take this medicine by mouth. Swallow it with a drink of water. Follow the directions on the prescription label. If it upsets your stomach you can take it with food. Take your medicine at regular intervals. Do not take it more often than directed. Do not stop taking except on the advice of your doctor or healthcare professional. A special MedGuide will be given to you by the pharmacist with eachprescription and refill. Be sure to read this information carefully each time. Talk to your pediatrician regarding the use of this medicine in children. While this drug may be prescribed for children as young as 10 years for selectedconditions, precautions do apply. Patients over age 62 years may have a stronger reaction to this medicine andneed smaller doses. Overdosage: If you think you have taken too much of this medicine contact apoison  control center or emergency room at once. NOTE: This medicine is only for you. Do not share this medicine with others. What if I miss a dose? If you miss a dose, take it as soon as you can. If it is almost time for yournext dose, take only that dose. Do not take double or extra doses. What may interact with this medication? Do not take this medicine with any of the following medications: cisapride dronedarone metoclopramide pimozide thioridazine This medicine may also interact with the following medications: alcohol antihistamines for allergy, cough, and cold atropine avasimibe certain antivirals for HIV or hepatitis certain medicines for anxiety or sleep certain medicines for bladder problems like oxybutynin, tolterodine certain medicines for depression like amitriptyline, fluoxetine, nefazodone, sertraline certain medicines for fungal infections like fluconazole, ketoconazole, itraconazole, posaconazole certain medicines for stomach problems like dicyclomine, hyoscyamine certain medicines for travel sickness like scopolamine cimetidine general anesthetics like halothane, isoflurane, methoxyflurane, propofol ipratropium levodopa or other medicines for Parkinson's disease medicines for blood pressure medicines for seizures medicines that relax muscles for surgery narcotic medicines for pain other medicines that prolong the QT interval (cause an abnormal heart rhythm) phenothiazines like chlorpromazine, prochlorperazine rifampin St. John's wort This list may not describe all possible interactions. Give your health care provider a list of all the medicines, herbs, non-prescription drugs, or dietary supplements you use. Also tell them if you smoke, drink alcohol, or use illegaldrugs. Some items may interact with your medicine. What should I watch for while using this medication? Visit your health care professional for regular checks on your progress. Tell your health care  professional if symptoms do not start to get better or if they get worse. Do not stop taking except  on your health care professional's advice. You may develop a severe reaction. Your health care professional will tell youhow much medicine to take. You may need to have an eye exam before and during use of this medicine. This medicine may increase blood sugar. Ask your health care provider ifchanges in diet or medicines are needed if you have diabetes. Patients and their families should watch out for new or worsening depression or thoughts of suicide. Also watch out for sudden or severe changes in feelings such as feeling anxious, agitated, panicky, irritable, hostile, aggressive, impulsive, severely restless, overly excited and hyperactive, or not being able to sleep. If this happens, especially at the beginning of antidepressanttreatment or after a change in dose, call your health care professional. Bonita Quin may get dizzy or drowsy. Do not drive, use machinery, or do anything that needs mental alertness until you know how this medicine affects you. Do not stand or sit up quickly, especially if you are an older patient. This reduces the risk of dizzy or fainting spells. Alcohol may interfere with the effect ofthis medicine. Avoid alcoholic drinks. This drug can cause problems with controlling your body temperature. It can lower the response of your body to cold temperatures. If possible, stay indoors during cold weather. If you must go outdoors, wear warm clothes. It can also lower the response of your body to heat. Do not overheat. Do not over-exercise. Stay out of the sun when possible. If you must be in the sun, wear cool clothing. Drink plenty of water. If you have trouble controlling your bodytemperature, call your health care provider right away. What side effects may I notice from receiving this medication? Side effects that you should report to your doctor or health care professionalas soon as  possible: allergic reactions like skin rash, itching or hives, swelling of the face, lips, or tongue breathing problems changes in vision confusion elevated mood, decreased need for sleep, racing thoughts, impulsive behavior eye pain fast, irregular heartbeat fever or chills, sore throat inability to keep still males: prolonged or painful erection problems with balance, talking, walking redness, blistering, peeling, or loosening of the skin, including inside the mouth seizures signs and symptoms of high blood sugar such as being more thirsty or hungry or having to urinate more than normal. You may also feel very tired or have blurry vision signs and symptoms of hypothyroidism like fatigue; increased sensitivity to cold; weight gain; hoarseness; thinning hair signs and symptoms of low blood pressure like dizziness; feeling faint or lightheaded; falls; unusually weak or tired signs and symptoms of neuroleptic malignant syndrome like confusion; fast, irregular heartbeat; high fever; increased sweating; stiff muscles sudden numbness or weakness of the face, arm, or leg suicidal thoughts or other mood changes trouble swallowing uncontrollable movements of the arms, face, head, mouth, neck, or upper body Side effects that usually do not require medical attention (report to yourdoctor or health care professional if they continue or are bothersome): change in sex drive or performance constipation drowsiness dry mouth upset stomach weight gain This list may not describe all possible side effects. Call your doctor for medical advice about side effects. You may report side effects to FDA at1-800-FDA-1088. Where should I keep my medication? Keep out of the reach of children. Store at room temperature between 15 and 30 degrees C (59 and 86 degrees F).Throw away any unused medicine after the expiration date. NOTE: This sheet is a summary. It may not cover all possible information. If you have  questions  about this medicine, talk to your doctor, pharmacist, orhealth care provider.  2022 Elsevier/Gold Standard (2019-06-22 14:42:48)

## 2021-02-20 ENCOUNTER — Other Ambulatory Visit: Payer: Self-pay | Admitting: Neurology

## 2021-03-25 ENCOUNTER — Ambulatory Visit (INDEPENDENT_AMBULATORY_CARE_PROVIDER_SITE_OTHER): Payer: 59 | Admitting: Neurology

## 2021-03-25 DIAGNOSIS — F5104 Psychophysiologic insomnia: Secondary | ICD-10-CM

## 2021-03-25 DIAGNOSIS — J449 Chronic obstructive pulmonary disease, unspecified: Secondary | ICD-10-CM

## 2021-03-25 DIAGNOSIS — R413 Other amnesia: Secondary | ICD-10-CM

## 2021-03-25 DIAGNOSIS — G4733 Obstructive sleep apnea (adult) (pediatric): Secondary | ICD-10-CM | POA: Diagnosis not present

## 2021-03-25 DIAGNOSIS — Z8659 Personal history of other mental and behavioral disorders: Secondary | ICD-10-CM

## 2021-03-25 DIAGNOSIS — F422 Mixed obsessional thoughts and acts: Secondary | ICD-10-CM

## 2021-03-25 DIAGNOSIS — G4719 Other hypersomnia: Secondary | ICD-10-CM

## 2021-04-02 NOTE — Progress Notes (Signed)
[facility]   HOME SLEEP TEST REPORT ( by Watch PAT)   STUDY DATE:  03-25-2021 DOB:  11/07/1960 MRN: [id number]   ORDERING CLINICIAN:  C. Dohmeier, MD REFERRING CLINICIAN: McKeown, MD    CLINICAL INFORMATION/HISTORY: patient has been seen by his [facility] physicians for chronic insomnia, neuropathy and Chiari malformation. . Blaylock has seen him for OSA. 03-14-2020, CPAP set up 2016,  the patient reportedly has not used CPAP in a while. He recently change to a new PCP and now is looking for a new sleep physician.  Presents today for chronic inability to initiate sleep. He states for as long as can remember he is used to on avg getting 2.5 hrs out of 24 hr period. He works 18 hour days but during the day finds him self dozing off at times (but never falling asleep) chronic alprazolam user. Medical history of Acid reflux, Arthritis, Blood dyscrasia, Bruise (08/08/11), Chest pain (08/08/2011), Hypercholesteremia, Hypertension, Labile (11/03/2013), Idiopathic thrombocytopenic purpura (ITP) (HCC),  Morbid obesity (HCC), Prediabetes, Sleep apnea, PTSD and Tick bites (08/08/11).    Epworth sleepiness score: 17/24. FSS 63/63 points    BMI: 32.8 kg/m   Neck Circumference: 16"   FINDINGS: Sleep Summary: This home sleep test recorded 6 hours and 51 minutes of which 5 hours and 27 minutes were sleep.  The percentage of REM sleep time was 26.1%.                                Respiratory Indices:   Calculated pAHI (per hour): The apnea hypopnea index overall was 16.5/h which constitutes mild sleep apnea.  REM sleep accentuated the apnea index slightly to 25.3/h.  In supine sleep we did not collect any data of apnea entities, the lowest apnea-hypopnea index was during prone sleep with an AHI of 13.6.  The patient slept 225 minutes in this position.   The snoring level however was loud and on average 44 dB.                                                                        Oxygen Saturation  Statistics:  O2 Saturation Range (%): Oxygen saturation varied between 86% at nadir and a maximum of 98%.  The mean oxygen saturation was 92%.  Total desaturation time at below 89% was 0.8 minutes only.                                      Pulse Rate Statistics:    Pulse Range:   pulse range was between 47 bpm and a maximum of 97 bpm with a mean heart rate of 65 bpm.  Reviewing the milligram the pulse rate varied mostly during REM sleep.             IMPRESSION:  This HST confirms the presence of mild OSA, but is not REM sleep dependent. There was no hypoxia and certainly no insomnia- the patient slept over 5 h 27 minutes.    RECOMMENDATION: This is mild apnea without significant hypoxemia and loud snoring.  The patient is a choice   between positive airway pressure therapy or dental device therapy. If he is willing to use CPAP, I will be happy to prescribe a device. 6-16 cm water pressure, with  3 cm EPR and mask of his choice.    Sleep interruptions by his own account mostly related to pain.  Primary apnea treatment will not change this.  Insomnia related to chronic pain is a secondary sleep problem and needs to be addressed with pain clinic  or his established neurologist.    INTERPRETING PHYSICIAN:   Carmen Dohmeier, MD   Medical Director of [facility].              

## 2021-04-04 ENCOUNTER — Telehealth: Payer: Self-pay | Admitting: Neurology

## 2021-04-04 ENCOUNTER — Encounter: Payer: Self-pay | Admitting: Neurology

## 2021-04-04 NOTE — Telephone Encounter (Signed)
I called pt. I advised pt that Dr. Vickey Huger reviewed their sleep study results and found that pt has moderate sleep apnea. Dr. Vickey Huger recommends that pt starts auto CPAP. I reviewed PAP compliance expectations with the pt. Pt is agreeable to starting a CPAP. I advised pt that an order will be sent to a DME, [facility], and [facility] will call the pt within about one week after they file with the pt's insurance. [facility] will show the pt how to use the machine, fit for masks, and troubleshoot the CPAP if needed. A follow up appt was made for insurance purposes with Dr. Vickey Huger on Dec 1,2022 at 9:30 am. Pt verbalized understanding to arrive 15 minutes early and bring their CPAP. A letter with all of this information in it will be mailed to the pt as a reminder. I verified with the pt that the address we have on file is correct. Pt verbalized understanding of results. Pt had no questions at this time but was encouraged to call back if questions arise. I have sent the order to [facility] and have received confirmation that they have received the order.

## 2021-04-04 NOTE — Addendum Note (Signed)
Addended by: Melvyn Novas on: 04/04/2021 05:07 PM   Modules accepted: Orders

## 2021-04-04 NOTE — Telephone Encounter (Signed)
-----   Message from Melvyn Novas, MD sent at 04/04/2021  5:07 PM EDT ----- IMPRESSION:  This HST confirms the presence of mild OSA, but is not REM sleep dependent. There was no hypoxia and certainly no insomnia- the patient slept over 5 h 27 minutes.   RECOMMENDATION: This is mild apnea without significant hypoxemia and loud snoring.  The patient is a choice between positive airway pressure therapy or dental device therapy. If he is willing to use CPAP, I will be happy to prescribe a device. 6-16 cm water pressure, with  3 cm EPR and mask of his choice.    Sleep interruptions by his own account mostly related to pain.  Primary apnea treatment will not change this.  Insomnia related to chronic pain is a secondary sleep problem and needs to be addressed with pain clinic  or his established physicians.

## 2021-04-04 NOTE — Progress Notes (Signed)
IMPRESSION:  This HST confirms the presence of mild OSA, but is not REM sleep dependent. There was no hypoxia and certainly no insomnia- the patient slept over 5 h 27 minutes.   RECOMMENDATION: This is mild apnea without significant hypoxemia and loud snoring.  The patient is a choice between positive airway pressure therapy or dental device therapy. If he is willing to use CPAP, I will be happy to prescribe a device. 6-16 cm water pressure, with  3 cm EPR and mask of his choice.    Sleep interruptions by his own account mostly related to pain.  Primary apnea treatment will not change this.  Insomnia related to chronic pain is a secondary sleep problem and needs to be addressed with pain clinic  or his established physicians.

## 2021-04-04 NOTE — Procedures (Signed)
[facility] SLEEP TEST REPORT ( by Watch PAT)   STUDY DATE:  03-25-2021 DOB:  05-04-1961 MRN: [id number]   ORDERING CLINICIAN:  Jacklynn Ganong, MD REFERRING Rosalie GumsOneta Rack, MD    CLINICAL INFORMATION/HISTORY: patient has been seen by his [facility] physicians for chronic insomnia, neuropathy and Chiari malformation. Lottie Rater has seen him for OSA. 03-14-2020, CPAP set up 2016,  the patient reportedly has not used CPAP in a while. He recently change to a new PCP and now is looking for a new sleep physician.  Presents today for chronic inability to initiate sleep. He states for as long as can remember he is used to on avg getting 2.5 hrs out of 24 hr period. He works 18 hour days but during the day finds him self dozing off at times (but never falling asleep) chronic alprazolam user. Medical history of Acid reflux, Arthritis, Blood dyscrasia, Bruise (08/08/11), Chest pain (08/08/2011), Hypercholesteremia, Hypertension, Labile (11/03/2013), Idiopathic thrombocytopenic purpura (ITP) (HCC),  Morbid obesity (HCC), Prediabetes, Sleep apnea, PTSD and Tick bites (08/08/11).    Epworth sleepiness score: 17/24. FSS 63/63 points    BMI: 32.8 kg/m   Neck Circumference: 16"   FINDINGS: Sleep Summary: This home sleep test recorded 6 hours and 51 minutes of which 5 hours and 27 minutes were sleep.  The percentage of REM sleep time was 26.1%.                                Respiratory Indices:   Calculated pAHI (per hour): The apnea hypopnea index overall was 16.5/h which constitutes mild sleep apnea.  REM sleep accentuated the apnea index slightly to 25.3/h.  In supine sleep we did not collect any data of apnea entities, the lowest apnea-hypopnea index was during prone sleep with an AHI of 13.6.  The patient slept 225 minutes in this position.   The snoring level however was loud and on average 44 dB.                                                                        Oxygen Saturation  Statistics:  O2 Saturation Range (%): Oxygen saturation varied between 86% at nadir and a maximum of 98%.  The mean oxygen saturation was 92%.  Total desaturation time at below 89% was 0.8 minutes only.                                      Pulse Rate Statistics:    Pulse Range:   pulse range was between 47 bpm and a maximum of 97 bpm with a mean heart rate of 65 bpm.  Reviewing the milligram the pulse rate varied mostly during REM sleep.             IMPRESSION:  This HST confirms the presence of mild OSA, but is not REM sleep dependent. There was no hypoxia and certainly no insomnia- the patient slept over 5 h 27 minutes.    RECOMMENDATION: This is mild apnea without significant hypoxemia and loud snoring.  The patient is a choice  between positive airway pressure therapy or dental device therapy. If he is willing to use CPAP, I will be happy to prescribe a device. 6-16 cm water pressure, with  3 cm EPR and mask of his choice.    Sleep interruptions by his own account mostly related to pain.  Primary apnea treatment will not change this.  Insomnia related to chronic pain is a secondary sleep problem and needs to be addressed with pain clinic  or his established neurologist.    INTERPRETING PHYSICIAN:   Melvyn Novas, MD   Medical Director of [facility].

## 2021-04-11 NOTE — Progress Notes (Signed)
Annual  Screening/Preventative Visit  & Comprehensive Evaluation & Examination  Future Appointments  Date Time Provider Department Center  04/12/2021 11:00 AM Lucky Cowboy, MD [facility] None  07/18/2021  9:30 AM Dohmeier, Porfirio Mylar, MD [facility] None            This very nice 60 y.o. [name] presents for a Screening /Preventative Visit & comprehensive evaluation and management of multiple medical co-morbidities.  Patient has been followed for HTN, HLD, Prediabetes and Vitamin D Deficiency.     Patient has OSA & had recent re-evaluation by Dr Dohmeier  & uses his CPAP sporadically.   In 2012, he was Dx'd with ITP and was treated by Dr Gaylyn Rong and was released as he went into remission.       Patient has been followed for labile HTN predates circa 2012 . Patient's BP has been controlled at home.  Today's BP is at goal - 130/85. Patient denies any cardiac symptoms as chest pain, palpitations, shortness of breath, dizziness or ankle swelling.       Patient's hyperlipidemia is controlled with diet and Rosuvastatin.  Patient denies myalgias or other medication SE's. Last lipids were at goal except elevated Trig's:  Lab Results  Component Value Date   CHOL 117 12/06/2020   HDL 26 (L) 12/06/2020   LDLCALC 61 12/06/2020   TRIG 257 (H) 12/06/2020   CHOLHDL 4.5 12/06/2020         Patient has hx/o prediabetes (A1c 5.9%  /2012) and patient denies reactive hypoglycemic symptoms, visual blurring, diabetic polys or paresthesias. Last A1c was normal & at goal:   Lab Results  Component Value Date   HGBA1C 5.6 09/05/2020          Finally, patient has history of Vitamin D Deficiency  ("20" /2011) and last vitamin D was at goal:   Lab Results  Component Value Date   VD25OH 81 09/05/2020     Current Outpatient Medications on File Prior to Visit  Medication Sig   ASPIRIN 81 PO Take by mouth daily.   Cholecalciferol (VITAMIN D PO) Take 10,000 Int'l Units by mouth daily.    Flaxseed, Linseed,  (FLAXSEED OIL) 1200 MG CAPS Take 1 capsule by mouth daily.    fluticasone (FLONASE) 50 MCG/ACT nasal spray Use 1 to 2 sprays each Nostril 1 to 2 x /day   gabapentin (NEURONTIN) 300 MG capsule Take  1 capsule  3 x /day  as needed for Neuropathy Pains   Magnesium 500 MG TABS Take by mouth daily.   Omega-3 Fatty Acids (FISH OIL) 1200 MG CAPS Take 1 capsule by mouth daily.    rosuvastatin (CRESTOR) 40 MG tablet Take 1 tablet daily  for Cholesterol   Zinc 50 MG TABS Take by mouth daily.      Allergies  Allergen Reactions   Niacin And Related Swelling    Red rash and swelling   Gemfibrozil    Penicillins Other (See Comments)    Unknown childhood allergy Received 2 Gms Ancef with no obvious reaction     Past Medical History:  Diagnosis Date   Acid reflux    Arthritis    Blood dyscrasia    ITP   Bruise 08/08/11   pt states brusing x 1 month   Chest pain 08/08/2011   "a little in last month"   Disorder of both ears 08/08/2011   blood in ears in past month - put on steroids, "tubes stopped up"   Fracture of left clavicle  02/08/2015   Hypercholesteremia    Hypertension, Labile 11/03/2013   no meds   Idiopathic thrombocytopenic purpura (ITP) (HCC)    Leg cramps 08/08/2011   bad in last 1.5 months   Morbid obesity (HCC)    Prediabetes    Sleep apnea    wears CPAP "sometimes"   Tick bites 08/08/11   60 to 70 ticks in last hunting season , last > 1 month     Health Maintenance  Topic Date Due   Zoster Vaccines- Shingrix (1 of 2) Never done   Pneumococcal Vaccine 630-61 Years old (2 - PCV) 05/19/2013   COVID-19 Vaccine (4 - Booster for Pfizer series) 09/25/2020   INFLUENZA VACCINE  03/18/2021   TETANUS/TDAP  02/03/2025   COLONOSCOPY  04/02/2027   Hepatitis C Screening  Completed   HIV Screening  Completed   HPV VACCINES  Aged Out     Immunization History  Administered Date(s) Administered   Influenza Inj Mdck Quad  05/19/2018   Influenza Split 05/24/2013, 06/22/2014,  04/18/2017   Influenza, Seasonal 07/03/2015   Influenza,inj,Quad  07/06/2017, 05/10/2019   Influenza,inj,quad 08/07/2016   Influenza 05/19/2018, 06/25/2020   PFIZER SARS-COV-2 Vacc 11/01/2019, 11/22/2019, 06/25/2020   PD Test 08/07/2016, 09/09/2017   Pneumococcal -23 05/19/2012   Td 08/18/2008, 02/04/2015    Last Colon - 04/01/2017 - Dr Ewing SchleinMagod Recc f/u 4-5 years ( 2022-2023)   Past Surgical History:  Procedure Laterality Date   BACK SURGERY  07/2010   lumbar - 3 discs   FRACTURE SURGERY     right foot fracture, no surgery   LUMBAR DISC SURGERY  2011   ORIF CLAVICULAR FRACTURE Left 02/08/2015   Procedure: OPEN REDUCTION INTERNAL FIXATION (ORIF) LEFT CLAVICULAR FRACTURE;  Surgeon: Teryl LucyJoshua Landau, MD;  Location: [facility];  Service: Orthopedics;  Laterality: Left;     Family History  Problem Relation Age of Onset   Uterine cancer Mother    Uterine cancer Sister    Stroke Neg Hx     Social History   Socioeconomic History   Marital status: Single   Years of education: 10   Highest education level: Not on file  Occupational History   Occupation: [employer]   Tobacco Use   Smoking status: Former    Packs/day: 1.00    Years: 8.00    Pack years: 8.00    Types: Cigarettes    Quit date: 01/03/2015    Years since quitting: 6.2   Smokeless tobacco: Current    Types: Chew   Tobacco comments:    smokes 1-2 cigarettes daily  Substance and Sexual Activity   Alcohol use: Yes    Alcohol/week: 0.0 standard drinks   Drug use: No    Comment: occasionally chews tobacco   Sexual activity: Yes      ROS Constitutional: Denies fever, chills, weight loss/gain, headaches, insomnia,  night sweats or change in appetite. Does c/o fatigue. Eyes: Denies redness, blurred vision, diplopia, discharge, itchy or watery eyes.  ENT: Denies discharge, congestion, post nasal drip, epistaxis, sore throat, earache, hearing loss, dental pain, Tinnitus, Vertigo, Sinus pain or  snoring.  Cardio: Denies chest pain, palpitations, irregular heartbeat, syncope, dyspnea, diaphoresis, orthopnea, PND, claudication or edema Respiratory: denies cough, dyspnea, DOE, pleurisy, hoarseness, laryngitis or wheezing.  Gastrointestinal: Denies dysphagia, heartburn, reflux, water brash, pain, cramps, nausea, vomiting, bloating, diarrhea, constipation, hematemesis, melena, hematochezia, jaundice or hemorrhoids Genitourinary: Denies dysuria, frequency, urgency, nocturia, hesitancy, discharge, hematuria or flank pain Musculoskeletal: Denies arthralgia, myalgia,  stiffness, Jt. Swelling, pain, limp or strain/sprain. Denies Falls. Skin: Denies puritis, rash, hives, warts, acne, eczema or change in skin lesion Neuro: No weakness, tremor, incoordination, spasms, paresthesia or pain Psychiatric: Denies confusion, memory loss or sensory loss. Denies Depression. Endocrine: Denies change in weight, skin, hair change, nocturia, and paresthesia, diabetic polys, visual blurring or hyper / hypo glycemic episodes.  Heme/Lymph: No excessive bleeding, bruising or enlarged lymph nodes.   Physical Exam  BP 130/85   Pulse 97   Temp (!) 97.3 F (36.3 C)   Resp 16   Ht 5' 9.5" (1.765 m)   Wt 231 lb 9.6 oz (105.1 kg)   SpO2 98%   BMI 33.71 kg/m   General Appearance: Well nourished and well groomed and in no apparent distress.  Eyes: PERRLA, EOMs, conjunctiva no swelling or erythema, normal fundi and vessels. Sinuses: No frontal/maxillary tenderness ENT/Mouth: EACs patent / TMs  nl. Nares clear without erythema, swelling, mucoid exudates. Oral hygiene is good. No erythema, swelling, or exudate. Tongue normal, non-obstructing. Tonsils not swollen or erythematous. Hearing normal.  Neck: Supple, thyroid not palpable. No bruits, nodes or JVD. Respiratory: Respiratory effort normal.  BS equal and clear bilateral without rales, rhonci, wheezing or stridor. Cardio: Heart sounds are normal with regular rate  and rhythm and no murmurs, rubs or gallops. Peripheral pulses are normal and equal bilaterally without edema. No aortic or femoral bruits. Chest: symmetric with normal excursions and percussion.  Abdomen: Soft, with Nl bowel sounds. Nontender, no guarding, rebound, hernias, masses, or organomegaly.  Lymphatics: Non tender without lymphadenopathy.  Musculoskeletal: Full ROM all peripheral extremities, joint stability, 5/5 strength, and normal gait. Skin: Warm and dry without rashes, lesions, cyanosis, clubbing or  ecchymosis.  Neuro: Cranial nerves intact, reflexes equal bilaterally. Normal muscle tone, no cerebellar symptoms. Sensation intact.  Pysch: Alert and oriented X 3 with normal affect, insight and judgment appropriate.   Assessment and Plan  1. Annual Preventative/Screening Exam   2. Labile hypertension  - EKG 12-Lead - [place], RETROPERITNL ABD,  LTD - Urinalysis, Routine w reflex microscopic - Microalbumin / creatinine urine ratio - CBC with Differential/Platelet - COMPLETE METABOLIC PANEL WITH GFR - Magnesium - TSH  3. Hyperlipidemia, mixed  - EKG 12-Lead - [place], RETROPERITNL ABD,  LTD - Lipid panel  4. Abnormal glucose  - EKG 12-Lead - [place], RETROPERITNL ABD,  LTD - Hemoglobin A1c - Insulin, random  5. Vitamin D deficiency  - VITAMIN D 25 Hydroxy   6. Prediabetes  - EKG 12-Lead - [place], RETROPERITNL ABD,  LTD - Hemoglobin A1c - Insulin, random  7. OSA (obstructive sleep apnea)   8. Screening-pulmonary TB  - TB Skin Test  9. Screening for colorectal cancer  - POC Hemoccult Bld/Stl   10. Prostate cancer screening  - PSA  11. Screening for ischemic heart disease  - EKG 12-Lead  12. Family history of stroke  - EKG 12-Lead - [place], RETROPERITNL ABD,  LTD  13. Former smoker  - EKG 12-Lead - [place], RETROPERITNL ABD,  LTD  14. Screening for AAA (aortic abdominal aneurysm)  - [place], RETROPERITNL ABD,  LTD  15. Fatigue, unspecified type  - Iron,  Total/Total Iron Binding Cap - Vitamin B12 - Testosterone - CBC with Differential/Platelet - TSH  16. Medication management  - Urinalysis, Routine w reflex microscopic - Microalbumin / creatinine urine ratio - CBC with Differential/Platelet - COMPLETE METABOLIC PANEL WITH GFR - Magnesium - Lipid panel - TSH - Hemoglobin A1c - Insulin,  random - VITAMIN D 25 Hydroxy           Patient was counseled in prudent diet, weight control to achieve/maintain BMI less than 25, BP monitoring, regular exercise and medications as discussed.  Discussed med effects and SE's. Routine screening labs and tests as requested with regular follow-up as recommended. Over 40 minutes of exam, counseling, chart review and high complex critical decision making was performed   Marinus Maw, MD

## 2021-04-12 ENCOUNTER — Encounter: Payer: Self-pay | Admitting: Internal Medicine

## 2021-04-12 ENCOUNTER — Other Ambulatory Visit: Payer: Self-pay

## 2021-04-12 ENCOUNTER — Ambulatory Visit (INDEPENDENT_AMBULATORY_CARE_PROVIDER_SITE_OTHER): Payer: 59 | Admitting: Internal Medicine

## 2021-04-12 VITALS — BP 130/85 | HR 97 | Temp 97.3°F | Resp 16 | Ht 69.5 in | Wt 231.6 lb

## 2021-04-12 DIAGNOSIS — R0989 Other specified symptoms and signs involving the circulatory and respiratory systems: Secondary | ICD-10-CM

## 2021-04-12 DIAGNOSIS — Z1389 Encounter for screening for other disorder: Secondary | ICD-10-CM

## 2021-04-12 DIAGNOSIS — Z131 Encounter for screening for diabetes mellitus: Secondary | ICD-10-CM

## 2021-04-12 DIAGNOSIS — Z1211 Encounter for screening for malignant neoplasm of colon: Secondary | ICD-10-CM

## 2021-04-12 DIAGNOSIS — Z111 Encounter for screening for respiratory tuberculosis: Secondary | ICD-10-CM

## 2021-04-12 DIAGNOSIS — Z823 Family history of stroke: Secondary | ICD-10-CM

## 2021-04-12 DIAGNOSIS — Z87891 Personal history of nicotine dependence: Secondary | ICD-10-CM | POA: Diagnosis not present

## 2021-04-12 DIAGNOSIS — N401 Enlarged prostate with lower urinary tract symptoms: Secondary | ICD-10-CM

## 2021-04-12 DIAGNOSIS — Z130 Encounter for screening for diseases of the blood and blood-forming organs and certain disorders involving the immune mechanism: Secondary | ICD-10-CM

## 2021-04-12 DIAGNOSIS — Z125 Encounter for screening for malignant neoplasm of prostate: Secondary | ICD-10-CM | POA: Diagnosis not present

## 2021-04-12 DIAGNOSIS — R7309 Other abnormal glucose: Secondary | ICD-10-CM

## 2021-04-12 DIAGNOSIS — R7303 Prediabetes: Secondary | ICD-10-CM

## 2021-04-12 DIAGNOSIS — R350 Frequency of micturition: Secondary | ICD-10-CM

## 2021-04-12 DIAGNOSIS — Z1329 Encounter for screening for other suspected endocrine disorder: Secondary | ICD-10-CM | POA: Diagnosis not present

## 2021-04-12 DIAGNOSIS — G4733 Obstructive sleep apnea (adult) (pediatric): Secondary | ICD-10-CM

## 2021-04-12 DIAGNOSIS — Z79899 Other long term (current) drug therapy: Secondary | ICD-10-CM | POA: Diagnosis not present

## 2021-04-12 DIAGNOSIS — Z13220 Encounter for screening for lipoid disorders: Secondary | ICD-10-CM | POA: Diagnosis not present

## 2021-04-12 DIAGNOSIS — E559 Vitamin D deficiency, unspecified: Secondary | ICD-10-CM | POA: Diagnosis not present

## 2021-04-12 DIAGNOSIS — E782 Mixed hyperlipidemia: Secondary | ICD-10-CM

## 2021-04-12 DIAGNOSIS — Z0001 Encounter for general adult medical examination with abnormal findings: Secondary | ICD-10-CM

## 2021-04-12 DIAGNOSIS — Z1212 Encounter for screening for malignant neoplasm of rectum: Secondary | ICD-10-CM

## 2021-04-12 DIAGNOSIS — Z136 Encounter for screening for cardiovascular disorders: Secondary | ICD-10-CM

## 2021-04-12 DIAGNOSIS — R5383 Other fatigue: Secondary | ICD-10-CM

## 2021-04-12 DIAGNOSIS — Z0000 Encounter for general adult medical examination without abnormal findings: Secondary | ICD-10-CM | POA: Diagnosis not present

## 2021-04-12 NOTE — Patient Instructions (Signed)

## 2021-04-14 ENCOUNTER — Encounter: Payer: Self-pay | Admitting: Internal Medicine

## 2021-04-15 ENCOUNTER — Other Ambulatory Visit: Payer: Self-pay

## 2021-04-15 DIAGNOSIS — Z111 Encounter for screening for respiratory tuberculosis: Secondary | ICD-10-CM | POA: Diagnosis not present

## 2021-04-15 DIAGNOSIS — E782 Mixed hyperlipidemia: Secondary | ICD-10-CM

## 2021-04-15 LAB — URINALYSIS, ROUTINE W REFLEX MICROSCOPIC
Bilirubin Urine: NEGATIVE
Glucose, UA: NEGATIVE
Hgb urine dipstick: NEGATIVE
Ketones, ur: NEGATIVE
Leukocytes,Ua: NEGATIVE
Nitrite: NEGATIVE
Protein, ur: NEGATIVE
Specific Gravity, Urine: 1.01 (ref 1.001–1.035)
pH: 6.5 (ref 5.0–8.0)

## 2021-04-15 LAB — COMPLETE METABOLIC PANEL WITHOUT GFR
AG Ratio: 1.9 (calc) (ref 1.0–2.5)
ALT: 18 U/L (ref 9–46)
AST: 17 U/L (ref 10–35)
Albumin: 4.5 g/dL (ref 3.6–5.1)
Alkaline phosphatase (APISO): 90 U/L (ref 35–144)
BUN: 14 mg/dL (ref 7–25)
CO2: 26 mmol/L (ref 20–32)
Calcium: 9.5 mg/dL (ref 8.6–10.3)
Chloride: 103 mmol/L (ref 98–110)
Creat: 0.98 mg/dL (ref 0.70–1.35)
Globulin: 2.4 g/dL (ref 1.9–3.7)
Glucose, Bld: 90 mg/dL (ref 65–99)
Potassium: 4.3 mmol/L (ref 3.5–5.3)
Sodium: 137 mmol/L (ref 135–146)
Total Bilirubin: 0.5 mg/dL (ref 0.2–1.2)
Total Protein: 6.9 g/dL (ref 6.1–8.1)
eGFR: 88 mL/min/{1.73_m2}

## 2021-04-15 LAB — MICROALBUMIN / CREATININE URINE RATIO
Creatinine, Urine: 48 mg/dL (ref 20–320)
Microalb Creat Ratio: 4 mcg/mg creat (ref <30)
Microalb, Ur: 0.2 mg/dL

## 2021-04-15 LAB — TSH: TSH: 1.51 m[IU]/L (ref 0.40–4.50)

## 2021-04-15 LAB — LIPID PANEL
Cholesterol: 107 mg/dL (ref <200)
HDL: 26 mg/dL — ABNORMAL LOW (ref >=40)
LDL Cholesterol (Calc): 52 mg/dL (calc)
Non-HDL Cholesterol (Calc): 81 mg/dL (calc) (ref <130)
Total CHOL/HDL Ratio: 4.1 (calc) (ref <5.0)
Triglycerides: 218 mg/dL — ABNORMAL HIGH (ref <150)

## 2021-04-15 LAB — CBC WITH DIFFERENTIAL/PLATELET
Absolute Monocytes: 737 {cells}/uL (ref 200–950)
Basophils Absolute: 64 {cells}/uL (ref 0–200)
Basophils Relative: 0.5 %
Eosinophils Absolute: 368 {cells}/uL (ref 15–500)
Eosinophils Relative: 2.9 %
HCT: 43.7 % (ref 38.5–50.0)
Hemoglobin: 14.8 g/dL (ref 13.2–17.1)
Lymphs Abs: 3137 {cells}/uL (ref 850–3900)
MCH: 30 pg (ref 27.0–33.0)
MCHC: 33.9 g/dL (ref 32.0–36.0)
MCV: 88.5 fL (ref 80.0–100.0)
MPV: 10.4 fL (ref 7.5–12.5)
Monocytes Relative: 5.8 %
Neutro Abs: 8395 {cells}/uL — ABNORMAL HIGH (ref 1500–7800)
Neutrophils Relative %: 66.1 %
Platelets: 179 10*3/uL (ref 140–400)
RBC: 4.94 Million/uL (ref 4.20–5.80)
RDW: 12.8 % (ref 11.0–15.0)
Total Lymphocyte: 24.7 %
WBC: 12.7 10*3/uL — ABNORMAL HIGH (ref 3.8–10.8)

## 2021-04-15 LAB — INSULIN, RANDOM: Insulin: 10.4 u[IU]/mL

## 2021-04-15 LAB — HEMOGLOBIN A1C
Hgb A1c MFr Bld: 5.5 % of total Hgb (ref <5.7)
Mean Plasma Glucose: 111 mg/dL
eAG (mmol/L): 6.2 mmol/L

## 2021-04-15 LAB — VITAMIN B12: Vitamin B-12: 346 pg/mL (ref 200–1100)

## 2021-04-15 LAB — IRON, TOTAL/TOTAL IRON BINDING CAP
%SAT: 21 % (calc) (ref 20–48)
Iron: 77 ug/dL (ref 50–180)
TIBC: 367 mcg/dL (calc) (ref 250–425)

## 2021-04-15 LAB — PSA: PSA: 0.28 ng/mL

## 2021-04-15 LAB — TESTOSTERONE: Testosterone: 482 ng/dL (ref 250–827)

## 2021-04-15 LAB — VITAMIN D 25 HYDROXY (VIT D DEFICIENCY, FRACTURES): Vit D, 25-Hydroxy: 79 ng/mL (ref 30–100)

## 2021-04-15 LAB — MAGNESIUM: Magnesium: 2.2 mg/dL (ref 1.5–2.5)

## 2021-04-15 MED ORDER — ROSUVASTATIN CALCIUM 40 MG PO TABS: ORAL_TABLET | ORAL | 0 refills | Status: DC

## 2021-04-21 ENCOUNTER — Other Ambulatory Visit: Payer: Self-pay | Admitting: Internal Medicine

## 2021-04-21 DIAGNOSIS — E782 Mixed hyperlipidemia: Secondary | ICD-10-CM

## 2021-05-01 ENCOUNTER — Other Ambulatory Visit: Payer: Self-pay

## 2021-05-01 DIAGNOSIS — Z1211 Encounter for screening for malignant neoplasm of colon: Secondary | ICD-10-CM

## 2021-05-01 DIAGNOSIS — Z1212 Encounter for screening for malignant neoplasm of rectum: Secondary | ICD-10-CM

## 2021-05-01 LAB — POC HEMOCCULT BLD/STL (HOME/3-CARD/SCREEN)
Card #2 Fecal Occult Blod, POC: NEGATIVE
Card #3 Fecal Occult Blood, POC: NEGATIVE
Fecal Occult Blood, POC: NEGATIVE

## 2021-05-30 ENCOUNTER — Other Ambulatory Visit: Payer: Self-pay | Admitting: Internal Medicine

## 2021-05-30 DIAGNOSIS — G540 Brachial plexus disorders: Secondary | ICD-10-CM
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
## 2021-06-17 NOTE — Patient Instructions (Addendum)
Please continue using your CPAP regularly. While your insurance requires that you use CPAP at least 4 hours each night on 70% of the nights, I recommend, that you not skip any nights and use it throughout the night if you can. Getting used to CPAP and staying with the treatment long term does take time and patience and discipline. Untreated obstructive sleep apnea when it is moderate to severe can have an adverse impact on cardiovascular health and raise her risk for heart disease, arrhythmias, hypertension, congestive heart failure, stroke and diabetes. Untreated obstructive sleep apnea causes sleep disruption, nonrestorative sleep, and sleep deprivation. This can have an impact on your day to day functioning and cause daytime sleepiness and impairment of cognitive function, memory loss, mood disturbance, and problems focussing. Using CPAP regularly can improve these symptoms.  I can not write alprazolam daily for insomnia. I will be happy to get you rescheduled with Dr Vickey Huger if you wish but she will not write this medication daily. I would encourage you to speak with PCP or let us refer you to psychiatry. I have placed a referral to psychology for cognitive behavioral therapy which is most helpful for insomnia related to anxiety. Please consider talking to your PCP regarding the pain you described.   Follow up in 4-6 months for CPAP compliance.

## 2021-06-18 ENCOUNTER — Encounter: Payer: Self-pay | Admitting: Family Medicine

## 2021-06-18 ENCOUNTER — Ambulatory Visit: Payer: 59 | Admitting: Family Medicine

## 2021-06-18 VITALS — BP 132/76 | HR 64 | Ht 69.5 in | Wt 231.5 lb

## 2021-06-18 DIAGNOSIS — G4733 Obstructive sleep apnea (adult) (pediatric): Secondary | ICD-10-CM | POA: Diagnosis not present

## 2021-06-18 DIAGNOSIS — G4700 Insomnia, unspecified: Secondary | ICD-10-CM | POA: Diagnosis not present

## 2021-06-18 DIAGNOSIS — Z9989 Dependence on other enabling machines and devices: Secondary | ICD-10-CM

## 2021-06-20 ENCOUNTER — Ambulatory Visit (INDEPENDENT_AMBULATORY_CARE_PROVIDER_SITE_OTHER): Payer: 59 | Admitting: Psychologist

## 2021-06-20 ENCOUNTER — Other Ambulatory Visit: Payer: Self-pay

## 2021-06-20 DIAGNOSIS — F411 Generalized anxiety disorder: Secondary | ICD-10-CM | POA: Diagnosis not present

## 2021-06-20 DIAGNOSIS — F5101 Primary insomnia: Secondary | ICD-10-CM | POA: Diagnosis not present

## 2021-07-15 ENCOUNTER — Ambulatory Visit: Payer: 59 | Admitting: Psychologist

## 2021-07-18 ENCOUNTER — Ambulatory Visit: Payer: 59 | Admitting: Neurology

## 2021-08-30 ENCOUNTER — Other Ambulatory Visit: Payer: Self-pay | Admitting: Internal Medicine

## 2021-09-19 ENCOUNTER — Ambulatory Visit: Payer: 59 | Admitting: Neurology

## 2021-10-16 ENCOUNTER — Ambulatory Visit: Payer: PRIVATE HEALTH INSURANCE | Admitting: Neurology

## 2021-10-16 ENCOUNTER — Encounter: Payer: Self-pay | Admitting: Neurology

## 2021-10-16 VITALS — BP 130/69 | HR 71 | Ht 69.5 in | Wt 232.0 lb

## 2021-10-16 DIAGNOSIS — G4700 Insomnia, unspecified: Secondary | ICD-10-CM

## 2021-10-16 DIAGNOSIS — F422 Mixed obsessional thoughts and acts: Secondary | ICD-10-CM | POA: Diagnosis not present

## 2021-10-16 DIAGNOSIS — F5104 Psychophysiologic insomnia: Secondary | ICD-10-CM

## 2021-10-16 DIAGNOSIS — G4739 Other sleep apnea: Secondary | ICD-10-CM

## 2021-10-16 DIAGNOSIS — Z9989 Dependence on other enabling machines and devices: Secondary | ICD-10-CM

## 2021-10-16 DIAGNOSIS — T8189XA Other complications of procedures, not elsewhere classified, initial encounter: Secondary | ICD-10-CM

## 2021-10-16 DIAGNOSIS — G4733 Obstructive sleep apnea (adult) (pediatric): Secondary | ICD-10-CM | POA: Diagnosis not present

## 2021-10-16 MED ORDER — TRAZODONE HCL 50 MG PO TABS: 50.0000 mg | ORAL_TABLET | Freq: Every day | ORAL | 1 refills | Status: DC

## 2021-10-16 NOTE — Patient Instructions (Signed)
Will ask DME to give a longer RAMP time and set 5-15 cm water , 2 cm EPR  ? ? ?CPAP and BIPAP Information ?CPAP and BIPAP are methods that use air pressure to keep your airways open and to help you breathe well. CPAP and BIPAP use different amounts of pressure. Your health care provider will tell you whether CPAP or BIPAP would be more helpful for you. ?CPAP stands for "continuous positive airway pressure." With CPAP, the amount of pressure stays the same while you breathe in (inhale) and out (exhale). ?BIPAP stands for "bi-level positive airway pressure." With BIPAP, the amount of pressure will be higher when you inhale and lower when you exhale. This allows you to take larger breaths. ?CPAP or BIPAP may be used in the hospital, or your health care provider may want you to use it at home. You may need to have a sleep study before your health care provider can order a machine for you to use at home. ?What are the advantages? ?CPAP or BIPAP can be helpful if you have: ?Sleep apnea. ?Chronic obstructive pulmonary disease (COPD). ?Heart failure. ?Medical conditions that cause muscle weakness, including muscular dystrophy or amyotrophic lateral sclerosis (ALS). ?Other problems that cause breathing to be shallow, weak, abnormal, or difficult. ?CPAP and BIPAP are most commonly used for obstructive sleep apnea (OSA) to keep the airways from collapsing when the muscles relax during sleep. ?What are the risks? ?Generally, this is a safe treatment. However, problems may occur, including: ?Irritated skin or skin sores if the mask does not fit properly. ?Dry or stuffy nose or nosebleeds. ?Dry mouth. ?Feeling gassy or bloated. ?Sinus or lung infection if the equipment is not cleaned properly. ?When should CPAP or BIPAP be used? ?In most cases, the mask only needs to be worn during sleep. Generally, the mask needs to be worn throughout the night and during any daytime naps. People with certain medical conditions may also need to  wear the mask at other times, such as when they are awake. Follow instructions from your health care provider about when to use the machine. ?What happens during CPAP or BIPAP? ?Both CPAP and BIPAP are provided by a small machine with a flexible plastic tube that attaches to a plastic mask that you wear. Air is blown through the mask into your nose or mouth. The amount of pressure that is used to blow the air can be adjusted on the machine. Your health care provider will set the pressure setting and help you find the best mask for you. ?Tips for using the mask ?Because the mask needs to be snug, some people feel trapped or closed-in (claustrophobic) when first using the mask. If you feel this way, you may need to get used to the mask. One way to do this is to hold the mask loosely over your nose or mouth and then gradually apply the mask more snugly. You can also gradually increase the amount of time that you use the mask. ?Masks are available in various types and sizes. If your mask does not fit well, talk with your health care provider about getting a different one. Some common types of masks include: ?Full face masks, which fit over the mouth and nose. ?Nasal masks, which fit over the nose. ?Nasal pillow or prong masks, which fit into the nostrils. ?If you are using a mask that fits over your nose and you tend to breathe through your mouth, a chin strap may be applied to help keep  your mouth closed. ?Use a skin barrier to protect your skin as told by your health care provider. ?Some CPAP and BIPAP machines have alarms that may sound if the mask comes off or develops a leak. ?If you have trouble with the mask, it is very important that you talk with your health care provider about finding a way to make the mask easier to tolerate. Do not stop using the mask. There could be a negative impact on your health if you stop using the mask. ?Tips for using the machine ?Place your CPAP or BIPAP machine on a secure table or  stand near an electrical outlet. ?Know where the on/off switch is on the machine. ?Follow instructions from your health care provider about how to set the pressure on your machine and when you should use it. ?Do not eat or drink while the CPAP or BIPAP machine is on. Food or fluids could get pushed into your lungs by the pressure of the CPAP or BIPAP. ?For home use, CPAP and BIPAP machines can be rented or purchased through home health care companies. Many different brands of machines are available. Renting a machine before purchasing may help you find out which particular machine works well for you. Your health insurance company may also decide which machine you may get. ?Keep the CPAP or BIPAP machine and attachments clean. Ask your health care provider for specific instructions. ?Check the humidifier if you have a dry stuffy nose or nosebleeds. Make sure it is working correctly. ?Follow these instructions at home: ?Take over-the-counter and prescription medicines only as told by your health care provider. Ask if you can take sinus medicine if your sinuses are blocked. ?Do not use any products that contain nicotine or tobacco. These products include cigarettes, chewing tobacco, and vaping devices, such as e-cigarettes. If you need help quitting, ask your health care provider. ?Keep all follow-up visits. This is important. ?Contact a health care provider if: ?You have redness or pressure sores on your head, face, mouth, or nose from the mask or head gear. ?You have trouble using the CPAP or BIPAP machine. ?You cannot tolerate wearing the CPAP or BIPAP mask. ?Someone tells you that you snore even when wearing your CPAP or BIPAP. ?Get help right away if: ?You have trouble breathing. ?You feel confused. ?Summary ?CPAP and BIPAP are methods that use air pressure to keep your airways open and to help you breathe well. ?If you have trouble with the mask, it is very important that you talk with your health care provider  about finding a way to make the mask easier to tolerate. Do not stop using the mask. There could be a negative impact to your health if you stop using the mask. ?Follow instructions from your health care provider about when to use the machine. ?This information is not intended to replace advice given to you by your health care provider. Make sure you discuss any questions you have with your health care provider. ?Document Revised: 03/13/2021 Document Reviewed: 07/13/2020 ?Elsevier Patient Education ? 2022 Elsevier Inc. ? ?
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
## 2021-10-16 NOTE — Addendum Note (Signed)
Addended by: Larey Seat on: 10/16/2021 02:22 PM ? ? Modules accepted: Orders ? ?

## 2021-10-17 NOTE — Progress Notes (Signed)
CM sent to [facility] for new order ?

## 2021-10-18 ENCOUNTER — Other Ambulatory Visit: Payer: Self-pay | Admitting: Internal Medicine

## 2021-10-18 DIAGNOSIS — E782 Mixed hyperlipidemia: Secondary | ICD-10-CM

## 2021-10-21 NOTE — Progress Notes (Deleted)
Assessment and Plan:  ? ? ?Essential hypertension ?- continue medications, DASH diet, exercise and monitor at home. Call if greater than 130/80.  ?-     CBC with Differential/Platelet ?-     COMPLETE METABOLIC PANEL WITH GFR ? ? ?Abnormal glucose ?Recent A1Cs at goal ?Discussed diet/exercise, weight management  ?A1c ? ?Morbid obesity (HCC) - BMI 30+ with OSA ?- follow up 3 months for progress monitoring ?- increase veggies, decrease carbs ?- long discussion about weight loss, diet, and exercise ? ?Hyperlipidemia, mixed ?-continue medications, check lipids, decrease fatty foods, increase activity.  ?-     Lipid panel ? ?Medication management ?Continued ? ?OSA ?Needs supplies, needs repeat study as last remove ?Will refer to Dr. Vickey Huger.  ? ?Continue diet and meds as discussed. Further disposition pending results of labs. ? ?Future Appointments  ?Date Time Provider Department Center  ?10/22/2021 10:30 AM Khaliq Turay, Loma Sousa, NP [facility] None  ?04/14/2022 10:00 AM Lucky Cowboy, MD [facility] None  ? ? ? ?HPI ?61 y.o. male  presents for 3 month follow up with hypertension, hyperlipidemia, prediabetes and vitamin D. ? ?He has been taking gabapentin for back radiculopathy, taking 300 mg TID PRN which works well for him, notes constipation with this but managing with stool softener. Tylenol doesn't help.  ? ?Notes ongoing difficulty with sleep, has tried gabapentin, trazodone, diphenhydramine. Xanax has been only med that helps thus far, without can only sleep 2-3 hours. Aware of tolerance risks and need to limit <5 days/week.  ? ?Hx of OSA, has CPAP but no supplies, last sleep study remote, has since lost a lot of weight, receptive to referral to have repeated.  ? ?BMI is There is no height or weight on file to calculate BMI., he has not been working on diet and exercise. ?Wt Readings from Last 3 Encounters:  ?10/16/21 232 lb (105.2 kg)  ?06/18/21 231 lb 8 oz (105 kg)  ?04/12/21 231 lb 9.6 oz (105.1 kg)  ? ? His blood  pressure has been controlled at home, today their BP is  .   He does workout. He denies chest pain, shortness of breath, dizziness.  He reports that he has been getting a lot of walking in.  He has been walking every where he goes.   ? ? He is on cholesterol medication, he is on crestor 1 pill a day and denies myalgias. His cholesterol is at goal. The cholesterol last visit was:   ?Lab Results  ?Component Value Date  ? CHOL 107 04/12/2021  ? HDL 26 (L) 04/12/2021  ? LDLCALC 52 04/12/2021  ? TRIG 218 (H) 04/12/2021  ? CHOLHDL 4.1 04/12/2021  ? ? He has been working on diet and exercise for hx of prediabetes, and denies foot ulcerations, hyperglycemia, hypoglycemia , increased appetite, nausea, paresthesia of the feet, polydipsia, polyuria, visual disturbances, vomiting and weight loss. Last A1C in the office was:  ?Lab Results  ?Component Value Date  ? HGBA1C 5.5 04/12/2021  ? ?Patient is on Vitamin D supplement.  ?Lab Results  ?Component Value Date  ? VD25OH 79 04/12/2021  ?   ? ?Current Medications:  ?Current Outpatient Medications on File Prior to Visit  ?Medication Sig Dispense Refill  ? ASPIRIN 81 PO Take by mouth daily.    ? Cholecalciferol (VITAMIN D PO) Take 10,000 Int'l Units by mouth daily.     ? Flaxseed, Linseed, (FLAXSEED OIL) 1200 MG CAPS Take 1 capsule by mouth daily.     ? fluticasone (FLONASE)  50 MCG/ACT nasal spray SPRAY 1 TO 2 SPRAYS IN EACH NOSTRIL 1 TO 2 TIMES DAILY 48 mL 3  ? gabapentin (NEURONTIN) 300 MG capsule TAKE 1 CAPSULE 3 TIMES DAILY AS NEEDED FOR NEUROPATHY PAINS 270 capsule 1  ? Magnesium 500 MG TABS Take by mouth daily.    ? Omega-3 Fatty Acids (FISH OIL) 1200 MG CAPS Take 1 capsule by mouth daily.     ? rosuvastatin (CRESTOR) 40 MG tablet TAKE 1 TABLET BY MOUTH EVERY DAY FOR CHOLESTEROL 90 tablet 1  ? traZODone (DESYREL) 50 MG tablet Take 1 tablet (50 mg total) by mouth at bedtime. 90 tablet 1  ? Zinc 50 MG TABS Take by mouth daily.    ? ?No current facility-administered medications  on file prior to visit.  ? ? ?Medical History:  ?Past Medical History:  ?Diagnosis Date  ? Acid reflux   ? Arthritis   ? Blood dyscrasia   ? ITP  ? Bruise 08/08/11  ? pt states brusing x 1 month  ? Chest pain 08/08/2011  ? "a little in last month"  ? Disorder of both ears 08/08/2011  ? blood in ears in past month - put on steroids, "tubes stopped up"  ? Fracture of left clavicle 02/08/2015  ? Hypercholesteremia   ? Hypertension, Labile 11/03/2013  ? no meds  ? Idiopathic thrombocytopenic purpura (ITP) (HCC)   ? Leg cramps 08/08/2011  ? bad in last 1.5 months  ? Morbid obesity (HCC)   ? Prediabetes   ? Sleep apnea   ? wears CPAP "sometimes"  ? Tick bites 08/08/11  ? 60 to 70 ticks in last hunting season , last > 1 month  ? ? ?Allergies:  ?Allergies  ?Allergen Reactions  ? Niacin And Related Swelling  ?  Red rash and swelling  ? Gemfibrozil   ? Penicillins Other (See Comments)  ?  Unknown childhood allergy ?Received 2 Gms Ancef with no obvious reaction  ?  ? ? ?Review of Systems:  ?Review of Systems  ?Constitutional:  Negative for chills, fever and malaise/fatigue.  ?HENT:  Negative for congestion, ear pain and sore throat.   ?Eyes:  Negative for blurred vision.  ?Respiratory:  Negative for cough, shortness of breath and wheezing.   ?Cardiovascular:  Negative for chest pain, palpitations and leg swelling.  ?Gastrointestinal:  Negative for abdominal pain, blood in stool, constipation, diarrhea, heartburn and melena.  ?Genitourinary: Negative.   ?Musculoskeletal:  Positive for back pain and neck pain. Negative for myalgias.  ?Neurological:  Negative for dizziness, tingling, tremors, sensory change, speech change, focal weakness, seizures, loss of consciousness and headaches.  ?Psychiatric/Behavioral:  Negative for depression. The patient has insomnia. The patient is not nervous/anxious.   ?Allergies ?Allergies  ?Allergen Reactions  ? Niacin And Related Swelling  ?  Red rash and swelling  ? Gemfibrozil   ? Penicillins  Other (See Comments)  ?  Unknown childhood allergy ?Received 2 Gms Ancef with no obvious reaction  ? ? ?SURGICAL HISTORY ?He  has a past surgical history that includes Back surgery (07/2010); Lumbar disc surgery (2011); Fracture surgery; and ORIF clavicular fracture (Left, 02/08/2015). ?FAMILY HISTORY ?His family history includes Uterine cancer in his mother and sister. ?SOCIAL HISTORY ?He  reports that he quit smoking about 6 years ago. His smoking use included cigarettes. He has a 8.00 pack-year smoking history. His smokeless tobacco use includes chew. He reports current alcohol use. He reports that he does not use drugs. ? ?Physical Exam: ?  There were no vitals taken for this visit. ?Wt Readings from Last 3 Encounters:  ?10/16/21 232 lb (105.2 kg)  ?06/18/21 231 lb 8 oz (105 kg)  ?04/12/21 231 lb 9.6 oz (105.1 kg)  ? ?General Appearance: Well nourished well developed, in no apparent distress. ?Eyes: PERRLA, EOMs, conjunctiva no swelling or erythema ?ENT/Mouth: Ear canals normal without obstruction, swelling, erythma, discharge.  TMs normal bilaterally.  Oropharynx moist, clear, without exudate, or postoropharyngeal swelling. ?Neck: Supple, thyroid normal,no cervical adenopathy  ?Respiratory: Respiratory effort normal, Breath sounds clear A&P without rhonchi, wheeze, or rale.  No retractions, no accessory usage. ?Cardio: RRR with no MRGs. Brisk peripheral pulses without edema.  ?Abdomen: Soft, + BS,  Non tender, no guarding, rebound, hernias, masses. ?Musculoskeletal: Full ROM, 5/5 strength, Normal gait ?Skin: Warm, dry without rashes, lesions, ecchymosis.  ?Neuro: Awake and oriented X 3, Cranial nerves intact. Normal muscle tone, no cerebellar symptoms. ?Psych: Normal affect, Insight and Judgment appropriate.  ? ? ?Revonda Humphrey, NP ?10:37 AM ?[facility] ? ?

## 2021-10-22 ENCOUNTER — Ambulatory Visit: Payer: Commercial Managed Care - HMO | Admitting: Nurse Practitioner

## 2021-10-22 DIAGNOSIS — I10 Essential (primary) hypertension: Secondary | ICD-10-CM

## 2021-10-22 DIAGNOSIS — E559 Vitamin D deficiency, unspecified: Secondary | ICD-10-CM

## 2021-10-22 DIAGNOSIS — Z79899 Other long term (current) drug therapy: Secondary | ICD-10-CM

## 2021-10-22 DIAGNOSIS — G4733 Obstructive sleep apnea (adult) (pediatric): Secondary | ICD-10-CM

## 2021-10-22 DIAGNOSIS — R7309 Other abnormal glucose: Secondary | ICD-10-CM

## 2021-10-22 DIAGNOSIS — E782 Mixed hyperlipidemia: Secondary | ICD-10-CM
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
## 2021-10-23 ENCOUNTER — Encounter: Payer: Self-pay | Admitting: Nurse Practitioner

## 2021-10-23 ENCOUNTER — Other Ambulatory Visit: Payer: Self-pay

## 2021-10-23 ENCOUNTER — Ambulatory Visit (INDEPENDENT_AMBULATORY_CARE_PROVIDER_SITE_OTHER): Payer: Managed Care, Other (non HMO) | Admitting: Nurse Practitioner

## 2021-10-23 VITALS — BP 118/80 | HR 76 | Temp 97.9°F | Wt 232.4 lb

## 2021-10-23 DIAGNOSIS — E782 Mixed hyperlipidemia: Secondary | ICD-10-CM

## 2021-10-23 DIAGNOSIS — I10 Essential (primary) hypertension: Secondary | ICD-10-CM | POA: Diagnosis not present

## 2021-10-23 DIAGNOSIS — G4733 Obstructive sleep apnea (adult) (pediatric): Secondary | ICD-10-CM

## 2021-10-23 DIAGNOSIS — R7309 Other abnormal glucose: Secondary | ICD-10-CM

## 2021-10-23 DIAGNOSIS — R2242 Localized swelling, mass and lump, left lower limb: Secondary | ICD-10-CM

## 2021-10-23 DIAGNOSIS — E559 Vitamin D deficiency, unspecified: Secondary | ICD-10-CM

## 2021-10-23 DIAGNOSIS — R6884 Jaw pain: Secondary | ICD-10-CM

## 2021-10-23 DIAGNOSIS — Z79899 Other long term (current) drug therapy: Secondary | ICD-10-CM

## 2021-10-23 MED ORDER — PREDNISONE 20 MG PO TABS: ORAL_TABLET | ORAL | 0 refills | Status: AC

## 2021-10-24 LAB — CBC WITH DIFFERENTIAL/PLATELET
Absolute Monocytes: 544 cells/uL (ref 200–950)
Basophils Absolute: 48 cells/uL (ref 0–200)
Basophils Relative: 0.6 %
Eosinophils Absolute: 304 cells/uL (ref 15–500)
Eosinophils Relative: 3.8 %
HCT: 43.3 % (ref 38.5–50.0)
Hemoglobin: 14.7 g/dL (ref 13.2–17.1)
Lymphs Abs: 3016 cells/uL (ref 850–3900)
MCH: 30.1 pg (ref 27.0–33.0)
MCHC: 33.9 g/dL (ref 32.0–36.0)
MCV: 88.5 fL (ref 80.0–100.0)
MPV: 10.5 fL (ref 7.5–12.5)
Monocytes Relative: 6.8 %
Neutro Abs: 4088 cells/uL (ref 1500–7800)
Neutrophils Relative %: 51.1 %
Platelets: 174 10*3/uL (ref 140–400)
RBC: 4.89 10*6/uL (ref 4.20–5.80)
RDW: 12.5 % (ref 11.0–15.0)
Total Lymphocyte: 37.7 %
WBC: 8 10*3/uL (ref 3.8–10.8)

## 2021-10-24 LAB — LIPID PANEL
Cholesterol: 101 mg/dL (ref <200)
HDL: 26 mg/dL — ABNORMAL LOW (ref >=40)
LDL Cholesterol (Calc): 52 mg/dL (calc)
Non-HDL Cholesterol (Calc): 75 mg/dL (calc) (ref <130)
Total CHOL/HDL Ratio: 3.9 (calc) (ref <5.0)
Triglycerides: 152 mg/dL — ABNORMAL HIGH (ref <150)

## 2021-10-24 LAB — COMPLETE METABOLIC PANEL WITH GFR
AG Ratio: 1.6 (calc) (ref 1.0–2.5)
ALT: 16 U/L (ref 9–46)
AST: 16 U/L (ref 10–35)
Albumin: 4.3 g/dL (ref 3.6–5.1)
Alkaline phosphatase (APISO): 93 U/L (ref 35–144)
BUN: 11 mg/dL (ref 7–25)
CO2: 24 mmol/L (ref 20–32)
Calcium: 9.4 mg/dL (ref 8.6–10.3)
Chloride: 106 mmol/L (ref 98–110)
Creat: 1.01 mg/dL (ref 0.70–1.35)
Globulin: 2.7 g/dL (calc) (ref 1.9–3.7)
Glucose, Bld: 80 mg/dL (ref 65–99)
Potassium: 4.3 mmol/L (ref 3.5–5.3)
Sodium: 141 mmol/L (ref 135–146)
Total Bilirubin: 0.5 mg/dL (ref 0.2–1.2)
Total Protein: 7 g/dL (ref 6.1–8.1)
eGFR: 85 mL/min/{1.73_m2} (ref >=60)

## 2021-10-24 LAB — TSH: TSH: 1.88 mIU/L (ref 0.40–4.50)

## 2021-11-21 ENCOUNTER — Other Ambulatory Visit: Payer: Self-pay | Admitting: Nurse Practitioner

## 2021-11-21 DIAGNOSIS — G540 Brachial plexus disorders: Secondary | ICD-10-CM

## 2021-12-10 ENCOUNTER — Ambulatory Visit (INDEPENDENT_AMBULATORY_CARE_PROVIDER_SITE_OTHER): Payer: Managed Care, Other (non HMO) | Admitting: Nurse Practitioner

## 2021-12-10 ENCOUNTER — Encounter: Payer: Self-pay | Admitting: Nurse Practitioner

## 2021-12-10 VITALS — BP 138/78 | HR 86 | Temp 97.9°F | Resp 16 | Ht 69.5 in | Wt 227.8 lb

## 2021-12-10 DIAGNOSIS — M2550 Pain in unspecified joint: Secondary | ICD-10-CM | POA: Diagnosis not present

## 2021-12-10 DIAGNOSIS — J3089 Other allergic rhinitis: Secondary | ICD-10-CM

## 2021-12-10 DIAGNOSIS — B372 Candidiasis of skin and nail: Secondary | ICD-10-CM | POA: Diagnosis not present

## 2021-12-10 DIAGNOSIS — G4700 Insomnia, unspecified: Secondary | ICD-10-CM

## 2021-12-10 DIAGNOSIS — W57XXXD Bitten or stung by nonvenomous insect and other nonvenomous arthropods, subsequent encounter: Secondary | ICD-10-CM

## 2021-12-10 DIAGNOSIS — S30861D Insect bite (nonvenomous) of abdominal wall, subsequent encounter: Secondary | ICD-10-CM

## 2021-12-10 MED ORDER — HYDROXYZINE PAMOATE 25 MG PO CAPS: 25.0000 mg | ORAL_CAPSULE | Freq: Every evening | ORAL | 0 refills | Status: DC | PRN

## 2021-12-10 MED ORDER — ECONAZOLE NITRATE 1 % EX CREA: TOPICAL_CREAM | Freq: Every day | CUTANEOUS | 0 refills | Status: AC

## 2021-12-10 MED ORDER — MELOXICAM 7.5 MG PO TABS: 7.5000 mg | ORAL_TABLET | Freq: Every day | ORAL | 2 refills | Status: DC

## 2021-12-10 MED ORDER — DICLOFENAC SODIUM 1 % EX GEL: 4.0000 g | Freq: Four times a day (QID) | CUTANEOUS | 1 refills | Status: DC

## 2021-12-10 NOTE — Patient Instructions (Signed)
Tick Bite Information, Adult  Ticks are insects that can bite. Most ticks live in shrubs and grassy areas. They climb onto people and animals that go by. Then they bite. Some ticks carry germs that can make you sick. How can I prevent tick bites? Take these steps: Use insect repellent Use an insect repellent that has 20% or higher of the ingredients DEET, picaridin, or IR3535. Follow the instructions on the label. Put it on: Bare skin. The tops of your boots. Your pant legs. The ends of your sleeves. If you use an insect repellent that has the ingredient permethrin, follow the instructions on the label. Put it on: Clothing. Boots. Supplies or outdoor gear. Tents. When you are outside Wear long sleeves and long pants. Wear light-colored clothes. Tuck your pant legs into your socks. Stay in the middle of the trail. Do not touch the bushes. Avoid walking through long grass. Check for ticks on your clothes, hair, and skin often while you are outside. Before going inside your house, check your clothes, skin, head, neck, armpits, waist, groin, and joint areas. When you go indoors Check your clothes for ticks. Dry your clothes in a dryer on high heat for 10 minutes or more. If clothes are damp, additional time may be needed. Wash your clothes right away if they need to be washed. Use hot water. Check your pets and outdoor gear. Shower right away. Check your body for ticks. Do a full body check using a mirror. What is the right way to remove a tick? Remove the tick from your skin as soon as possible. Do not remove the tick with your bare fingers. To remove a tick that is crawling on your skin: Go outdoors and brush the tick off. Use tape or a lint roller. To remove a tick that is biting: Wash your hands. If you have latex gloves, put them on. Use tweezers, curved forceps, or a tick-removal tool to grasp the tick. Grasp the tick as close to your skin and as close to the tick's head as  possible. Gently pull up until the tick lets go. Try to keep the tick's head attached to its body. Do not twist or jerk the tick. Do not squeeze or crush the tick. Do not try to remove a tick with heat, alcohol, petroleum jelly, or fingernail polish. What should I do after taking out a tick? Throw away the tick. Do not crush a tick with your fingers. Clean the bite area and your hands with soap and water, rubbing alcohol, or an iodine wash. If an antiseptic cream or ointment is available, apply a small amount to the bite area. Wash and disinfect any instruments that you used to remove the tick. How should I get rid of a live tick? To dispose of a live tick, use one of these methods: Place the tick in rubbing alcohol. Place the tick in a bag or container you can close tightly. Wrap the tick tightly in tape. Flush the tick down the toilet. Contact a doctor if: You have symptoms, such as: A fever or chills. A red rash that makes a circle (bull's-eye rash) in the bite area. Redness and swelling where the tick bit you. Headache. Pain in a muscle, joint, or bone. Being more tired than normal. Trouble walking or moving your legs. Numbness in your legs. Tender and swollen lymph glands. A part of a tick breaks off and gets stuck in your skin. Get help right away if: You cannot remove   a tick. You cannot move (have paralysis) or feel weak. You are feeling worse or have new symptoms. You find a tick that is biting you and filled with blood. This is important if you are in an area where diseases from ticks are common. Summary Ticks may carry germs that can make you sick. To prevent tick bites wear long sleeves, long pants, and light colors. Use insect repellent. Follow the instructions on the label. If the tick is biting, do not try to remove it with heat, alcohol, petroleum jelly, or fingernail polish. Use tweezers, curved forceps, or a tick-removal tool to grasp the tick. Gently pull up  until the tick lets go. Do not twist or jerk the tick. Do not squeeze or crush the tick. If you have symptoms, contact a doctor. This information is not intended to replace advice given to you by your health care provider. Make sure you discuss any questions you have with your health care provider. Document Revised: 08/01/2019 Document Reviewed: 08/01/2019 Elsevier Patient Education  2023 [employer]  

## 2021-12-10 NOTE — Progress Notes (Signed)
Assessment and Plan: ? ?[name] was seen today for a episodic visit. ? ?Diagnoses and all order for this visit: ? ?There are no diagnoses linked to this encounter. ? ?Tick bite of abdomen, subsequent encounter ?Discussed keep clothes tucked in. ?Checking for ticks after each [place]. ?Considering insect repellent. ? ?- B. Burgdorfi Antibodies ? ?Insomnia, unspecified type ?Continue CPAP ?Neurology on board ?Stop Trazadone. ?Start Hydroxyzine for tmt of anxiety/PTSD/insomnia/allergies. ?Continue to  monitor. ? ?- hydrOXYzine (VISTARIL) 25 MG capsule; Take 1 capsule (25 mg total) by mouth at bedtime as needed.  Dispense: 30 capsule; Refill: 0 ? ?Generalized joint pain ?Start Meloxicam 7.5 mg. ?Take with food. ?OTC H2 blocker, Pepcid/Prilosec ?Diclofenac Sodium gel refilled. ?Stay well hydrated. ?Monitor for bleeding, increase in abd pain.  ?Burgdorfi Antibodies pending ? ?- meloxicam (MOBIC) 7.5 MG tablet; Take 1 tablet (7.5 mg total) by mouth daily.  Dispense: 30 tablet; Refill: 2 ?- diclofenac Sodium (VOLTAREN) 1 % GEL; Apply 4 g topically 4 (four) times daily.  Dispense: 4 g; Refill: 1 ?- B. Burgdorfi Antibodies ? ?Yeast infection of the skin ? ?- econazole nitrate 1 % cream; Apply topically daily.  Dispense: 15 g; Refill: 0 ? ?Environmental and seasonal allergies ?Discussed refill on Fexofenadine however will trial Hydroxyzine at this time. ?Continue to monitor.  ? ?- hydrOXYzine (VISTARIL) 25 MG capsule; Take 1 capsule (25 mg total) by mouth at bedtime as needed.  Dispense: 30 capsule; Refill: 0 ? ? ?Continue to monitor for any increase in fever, chills, N/V, diarrhea, changes to bowel habits, blood in stool, joint pain.   ? ?Notify office for further evaluation and treatment, questions or concerns if s/s fail to improve. The risks and benefits of my recommendations, as well as other treatment options were discussed with the patient today. Questions were answered. ? ?Further disposition pending results of labs.  Discussed med's effects and SE's.   ? ?Over 30 minutes of exam, counseling, chart review, and critical decision making was performed.  ? ?Future Appointments  ?Date Time Provider Octa  ?12/10/2021  9:30 AM Darrol Jump, NP [facility] None  ?04/14/2022 10:00 AM Unk Pinto, MD [facility] None  ? ? ?------------------------------------------------------------------------------------------------------------------ ? ? ?HPI ?BP 138/78   Pulse 86   Temp 97.9 ?F (36.6 ?C)   Resp 16   Ht 5' 9.5" (1.765 m)   Wt 227 lb 12.8 oz (103.3 kg)   SpO2 96%   BMI 33.16 kg/m?  ? ? ?61 y.o.male presents for follow up on multiple issues.   ? ?Reports 3 days ago he encountered a tick bite in the naval area to which he removed with tweezers.  He did not notice a bulls eye rash.  Endorses fatigue, increase in joint and muscle pain.  Reports no history of RMSF, Lyme disease.  He states that he has not been tested in the past.  He does have an extensive surgical hx that includes Back surgery (07/2010); Lumbar disc surgery (2011); Fracture surgery; and ORIF clavicular fracture (Left, 02/08/2015). He has used Diclofenac cream in the past with some effectiveness.   ? ?He is also concerned for insomnia. Has a hx of insomnia and OSA.  Uses CPAP  nightly. Follows with Dr. Brett Fairy, Neurology.   Has used Trazodone 25 mg HS in the past, but is no longer taking as it is not effective.  Reports that he has tried to have sleep study but reports insurance will not pay.  States that he is unable to fall asleep  and stay asleep d/t pain and mind racing.  Has been told that he has a hx of PTSD that contributes to his insomnia.  Reports being placed on an anxiety/depression medication in the past but threw it away d/t SE of vivid dreams, which made the insomnia worse.  No known medication on file for review. He currently takes Gabapentin, prescribed for TID but he reports taking PRN.   ? ?He is requesting a refill on his allergy  medication, Allegra.  Has been using Flonase.  This has not helped over the past few months.  Has tried to avoid triggers.   ? ?He is also requesting a refill on antifungal cream, Econazole used to treat topical candida rash.   ? ?Allergies:  ?Allergies  ?Allergen Reactions  ? Niacin And Related Swelling  ?  Red rash and swelling  ? Gemfibrozil   ? Penicillins Other (See Comments)  ?  Unknown childhood allergy ?Received 2 Gms Ancef with no obvious reaction  ? ?Medical History:  ?has Morbid obesity ([place]); Hyperlipidemia, mixed; GERD; Prediabetes; Essential hypertension; Vitamin D deficiency; Medication management; Obesity (BMI 30.0-34.9); Vertigo; OSA (obstructive sleep apnea); Insomnia; Excessive daytime sleepiness; Memory changes; Mixed obsessional thoughts and acts; History of posttraumatic stress disorder (PTSD); Chronic insomnia; and OSA and COPD overlap syndrome ([place]) on their problem list. ?Surgical History:  ?He  has a past surgical history that includes Back surgery (07/2010); Lumbar disc surgery (2011); Fracture surgery; and ORIF clavicular fracture (Left, 02/08/2015). ?Family History:  ?Hisfamily history includes Uterine cancer in his mother and sister. ?Social History:  ? reports that he quit smoking about 6 years ago. His smoking use included cigarettes. He has a 8.00 pack-year smoking history. His smokeless tobacco use includes chew. He reports current alcohol use. He reports that he does not use drugs. ? ? ?Past Medical History:  ?Diagnosis Date  ? Acid reflux   ? Arthritis   ? Blood dyscrasia   ? ITP  ? Bruise 08/08/11  ? pt states brusing x 1 month  ? Chest pain 08/08/2011  ? "a little in last month"  ? Disorder of both ears 08/08/2011  ? blood in ears in past month - put on steroids, "tubes stopped up"  ? Fracture of left clavicle 02/08/2015  ? Hypercholesteremia   ? Hypertension, Labile 11/03/2013  ? no meds  ? Idiopathic thrombocytopenic purpura (ITP) (HCC)   ? Leg cramps 08/08/2011  ? bad in last  1.5 months  ? Morbid obesity ([place])   ? Prediabetes   ? Sleep apnea   ? wears CPAP "sometimes"  ? Tick bites 08/08/11  ? 60 to 70 ticks in last hunting season , last > 1 month  ?  ? ?Allergies  ?Allergen Reactions  ? Niacin And Related Swelling  ?  Red rash and swelling  ? Gemfibrozil   ? Penicillins Other (See Comments)  ?  Unknown childhood allergy ?Received 2 Gms Ancef with no obvious reaction  ? ? ?Current Outpatient Medications on File Prior to Visit  ?Medication Sig  ? ASPIRIN 81 PO Take by mouth daily.  ? Cholecalciferol (VITAMIN D PO) Take 10,000 Int'l Units by mouth daily.   ? Flaxseed, Linseed, (FLAXSEED OIL) 1200 MG CAPS Take 1 capsule by mouth daily.   ? fluticasone (FLONASE) 50 MCG/ACT nasal spray SPRAY 1 TO 2 SPRAYS IN EACH NOSTRIL 1 TO 2 TIMES DAILY  ? gabapentin (NEURONTIN) 300 MG capsule TAKE 1 CAPSULE 3 TIMES DAILY AS NEEDED FOR NEUROPATHY PAINS  ?  Magnesium 500 MG TABS Take by mouth daily.  ? Omega-3 Fatty Acids (FISH OIL) 1200 MG CAPS Take 1 capsule by mouth daily.   ? rosuvastatin (CRESTOR) 40 MG tablet TAKE 1 TABLET BY MOUTH EVERY DAY FOR CHOLESTEROL  ? Zinc 50 MG TABS Take by mouth daily.  ? traZODone (DESYREL) 50 MG tablet Take 1 tablet (50 mg total) by mouth at bedtime. (Patient not taking: Reported on 12/10/2021)  ? ?No current facility-administered medications on file prior to visit.  ? ? ?ROS: all negative except what is noted in the HPI.  ? ?Physical Exam: ? ?BP 138/78   Pulse 86   Temp 97.9 ?F (36.6 ?C)   Resp 16   Ht 5' 9.5" (1.765 m)   Wt 227 lb 12.8 oz (103.3 kg)   SpO2 96%   BMI 33.16 kg/m?  ? ?General Appearance: NAD.  Awake, conversant and cooperative. ?Eyes: PERRLA, EOMs intact.  Sclera white.  Conjunctiva without erythema. ?Sinuses: No frontal/maxillary tenderness.  No nasal discharge. Nares patent.  ?ENT/Mouth: Ext aud canals clear.  Bilateral TMs w/DOL and without erythema or bulging. Hearing intact.  Posterior pharynx without swelling or exudate.  Tonsils without  swelling or erythema.  ?Neck: LROM d/t pain.  Supple.  No masses, nodules or thyromegaly. ?Respiratory: Effort is regular with non-labored breathing. Breath sounds are equal bilaterally without rales, rhonchi, wheezing or strid

## 2021-12-11 LAB — B. BURGDORFI ANTIBODIES: B burgdorferi Ab IgG+IgM: <0.9 index

## 2021-12-13 ENCOUNTER — Telehealth: Payer: Self-pay | Admitting: Nurse Practitioner

## 2021-12-13 NOTE — Progress Notes (Signed)
PATIENT IS AWARE OF LAB RESULTS. -E WELCH

## 2021-12-13 NOTE — Telephone Encounter (Signed)
The first night patient took his full dose of Meloxicam, it made him loopy and he was stumbling. The next night, he only took 1/2 a pill. He didn't have those side effects after halfing his dose. Is it okay if he continues to do this?  ?

## 2022-01-09 ENCOUNTER — Ambulatory Visit (INDEPENDENT_AMBULATORY_CARE_PROVIDER_SITE_OTHER): Payer: Commercial Managed Care - HMO | Admitting: Nurse Practitioner

## 2022-01-09 ENCOUNTER — Encounter: Payer: Self-pay | Admitting: Nurse Practitioner

## 2022-01-09 VITALS — BP 135/88 | HR 60 | Temp 97.1°F | Wt 230.0 lb

## 2022-01-09 DIAGNOSIS — M25519 Pain in unspecified shoulder: Secondary | ICD-10-CM | POA: Diagnosis not present

## 2022-01-09 DIAGNOSIS — N200 Calculus of kidney: Secondary | ICD-10-CM | POA: Diagnosis not present

## 2022-01-09 DIAGNOSIS — M2550 Pain in unspecified joint: Secondary | ICD-10-CM

## 2022-01-09 DIAGNOSIS — R109 Unspecified abdominal pain: Secondary | ICD-10-CM

## 2022-01-09 NOTE — Patient Instructions (Signed)

## 2022-01-09 NOTE — Progress Notes (Signed)
Assessment and Plan:  [name] was seen today for a follow up.  Diagnoses and all order for this visit:  1. Arthralgia of shoulder, unspecified laterality Improved and stable. Continue Meloxicam 7.5 mg daily PRN Take with H2 blocker such as Pepcid/Prilosec RICE method when flared. Alternate Ice/Heat. Contact office if s/s worsen.   2. Arthralgia, unspecified joint Improved and stable. Continue Meloxicam 7.5 mg daily PRN Take with H2 blocker such as Pepcid/Prilosec RICE method when flared. Alternate Ice/Heat. Contact office if s/s worsen.  3. Right flank pain Possible kidney stone.   Stay well hydrated. Discussed symptoms of passing a stone, increase in pain, hematuria, fever, chills, N/V. Contact office s/s  worsen. Continue to monitor.   - Urinalysis, Routine w reflex microscopic  4. Nephrolithiasis Discussed [place] in the future if s/s continue.  Side effects to medication discussed in detail.  All questions and concerns addressed.  Notify office for further evaluation and treatment, questions or concerns if s/s fail to improve. The risks and benefits of my recommendations, as well as other treatment options were discussed with the patient today. Questions were answered.  Further disposition pending results of labs. Discussed med's effects and SE's.    Over 15 minutes of exam, counseling, chart review, and critical decision making was performed.   Future Appointments  Date Time Provider Department Center  04/14/2022 10:00 AM Lucky Cowboy, MD [facility] None    ------------------------------------------------------------------------------------------------------------------   HPI BP 135/88   Pulse 60   Temp (!) 97.1 F (36.2 C)   Wt 230 lb (104.3 kg)   SpO2 96%   BMI 33.48 kg/m   61 y.o.male presents for follow up on tick bite and generalized joint pain.  He is feeling much better.  Denies pain while taking Meloxicam 7.5 mg daily.  He is taking with  food and OTC H2 Blocker for gastric protection.  His Burgdorfi Antibody panel was negative for lyme disease.  Reports increase in right flak pain.  Endorses kidney stones it the past.  Pain is intermittent.  Can radiate to the RLQ.  Some discoloration to urine.  Denies fever, chills, N/V.    Past Medical History:  Diagnosis Date   Acid reflux    Arthritis    Blood dyscrasia    ITP   Bruise 08/08/11   pt states brusing x 1 month   Chest pain 08/08/2011   "a little in last month"   Disorder of both ears 08/08/2011   blood in ears in past month - put on steroids, "tubes stopped up"   Fracture of left clavicle 02/08/2015   Hypercholesteremia    Hypertension, Labile 11/03/2013   no meds   Idiopathic thrombocytopenic purpura (ITP) (HCC)    Leg cramps 08/08/2011   bad in last 1.5 months   Morbid obesity ([facility])    Prediabetes    Sleep apnea    wears CPAP "sometimes"   Tick bites 08/08/11   60 to 70 ticks in last hunting season , last > 1 month     Allergies  Allergen Reactions   Niacin And Related Swelling    Red rash and swelling   Gemfibrozil    Penicillins Other (See Comments)    Unknown childhood allergy Received 2 Gms Ancef with no obvious reaction    Current Outpatient Medications on File Prior to Visit  Medication Sig   ASPIRIN 81 PO Take by mouth daily.   Cholecalciferol (VITAMIN D PO) Take 10,000 Int'l Units by mouth daily.  diclofenac Sodium (VOLTAREN) 1 % GEL Apply 4 g topically 4 (four) times daily.   econazole nitrate 1 % cream Apply topically daily.   Flaxseed, Linseed, (FLAXSEED OIL) 1200 MG CAPS Take 1 capsule by mouth daily.    fluticasone (FLONASE) 50 MCG/ACT nasal spray SPRAY 1 TO 2 SPRAYS IN EACH NOSTRIL 1 TO 2 TIMES DAILY   gabapentin (NEURONTIN) 300 MG capsule TAKE 1 CAPSULE 3 TIMES DAILY AS NEEDED FOR NEUROPATHY PAINS   hydrOXYzine (VISTARIL) 25 MG capsule Take 1 capsule (25 mg total) by mouth at bedtime as needed.   Magnesium 500 MG TABS Take by mouth  daily.   meloxicam (MOBIC) 7.5 MG tablet Take 1 tablet (7.5 mg total) by mouth daily.   Omega-3 Fatty Acids (FISH OIL) 1200 MG CAPS Take 1 capsule by mouth daily.    rosuvastatin (CRESTOR) 40 MG tablet TAKE 1 TABLET BY MOUTH EVERY DAY FOR CHOLESTEROL   traZODone (DESYREL) 50 MG tablet Take 1 tablet (50 mg total) by mouth at bedtime.   Zinc 50 MG TABS Take by mouth daily.   No current facility-administered medications on file prior to visit.    ROS: all negative except what is noted in the HPI.   Physical Exam:  BP 135/88   Pulse 60   Temp (!) 97.1 F (36.2 C)   Wt 230 lb (104.3 kg)   SpO2 96%   BMI 33.48 kg/m   General Appearance: NAD.  Awake, conversant and cooperative. Eyes: PERRLA, EOMs intact.  Sclera white.  Conjunctiva without erythema. Sinuses: No frontal/maxillary tenderness.  No nasal discharge. Nares patent.  ENT/Mouth: Ext aud canals clear.  Bilateral TMs w/DOL and without erythema or bulging. Hearing intact.  Posterior pharynx without swelling or exudate.  Tonsils without swelling or erythema.  Neck: Supple.  No masses, nodules or thyromegaly. Respiratory: Effort is regular with non-labored breathing. Breath sounds are equal bilaterally without rales, rhonchi, wheezing or stridor.  Cardio: RRR with no MRGs. Brisk peripheral pulses without edema.  Abdomen: Right CVA tenderness, right flak pain tenderness.  Active BS in all four quadrants.   Lymphatics: Non tender without lymphadenopathy.  Musculoskeletal: Full ROM, 5/5 strength, normal ambulation.  No clubbing or cyanosis. Skin: Appropriate color for ethnicity. Warm without rashes, lesions, ecchymosis, ulcers.  Neuro: CN II-XII grossly normal. Normal muscle tone without cerebellar symptoms and intact sensation.   Psych: AO X 3,  appropriate mood and affect, insight and judgment.     Adela Glimpse, NP 9:44 AM [facility]

## 2022-01-10 LAB — URINALYSIS, ROUTINE W REFLEX MICROSCOPIC
Bilirubin Urine: NEGATIVE
Glucose, UA: NEGATIVE
Hgb urine dipstick: NEGATIVE
Ketones, ur: NEGATIVE
Leukocytes,Ua: NEGATIVE
Nitrite: NEGATIVE
Protein, ur: NEGATIVE
Specific Gravity, Urine: 1.014 (ref 1.001–1.035)
pH: 6.5 (ref 5.0–8.0)

## 2022-01-30 LAB — HM COLONOSCOPY

## 2022-02-05 ENCOUNTER — Telehealth: Payer: Self-pay | Admitting: Neurology

## 2022-02-05 NOTE — Telephone Encounter (Signed)
[employer] pending faxed notes  

## 2022-02-10 NOTE — Telephone Encounter (Signed)
CPAP Titration- [employer] auth: [id number] (exp. 02/05/22 to 05/08/22. Patient is scheduled at [facility] for 710/23 at 9 pm.

## 2022-02-24 ENCOUNTER — Ambulatory Visit (INDEPENDENT_AMBULATORY_CARE_PROVIDER_SITE_OTHER): Payer: Commercial Managed Care - HMO | Admitting: Neurology

## 2022-02-24 DIAGNOSIS — Z9989 Dependence on other enabling machines and devices: Secondary | ICD-10-CM

## 2022-02-24 DIAGNOSIS — G4739 Other sleep apnea: Secondary | ICD-10-CM

## 2022-02-24 DIAGNOSIS — F422 Mixed obsessional thoughts and acts: Secondary | ICD-10-CM

## 2022-02-24 DIAGNOSIS — F5104 Psychophysiologic insomnia: Secondary | ICD-10-CM

## 2022-02-24 DIAGNOSIS — G4700 Insomnia, unspecified: Secondary | ICD-10-CM

## 2022-02-24 DIAGNOSIS — G4733 Obstructive sleep apnea (adult) (pediatric): Secondary | ICD-10-CM | POA: Diagnosis not present

## 2022-03-04 ENCOUNTER — Telehealth: Payer: Self-pay | Admitting: *Deleted

## 2022-03-04 DIAGNOSIS — G4739 Other sleep apnea: Secondary | ICD-10-CM | POA: Insufficient documentation

## 2022-03-04 DIAGNOSIS — G4733 Obstructive sleep apnea (adult) (pediatric): Secondary | ICD-10-CM | POA: Insufficient documentation

## 2022-03-04 NOTE — Procedures (Signed)
PATIENT'S NAME:  [name], [name] DOB:      [birth year]      MR#:    [id number]     DATE OF RECORDING: 02/24/2022 Margaretann Loveless, RPSGT REFERRING M.D.:  Lucky Cowboy, MD Study Performed:   CPAP  Titration HISTORY:  RV on New CPAP : 10-16-2021: He does feel that CPAP therapy helps him sleep deeper and he wakes feeling more refreshed, but he can't go to sleep. He didn't tolerate Seroquel, he did change to gabapentin and that helped pain, some help with sleep.  [name] has been seen after his sleep study by Shawnie Dapper in November 2022.  he reports today that he has trouble using the new CPAP machine and also that he did not get filters and other supplies in time.  He has mild obstructive sleep apnea, a home sleep test in August that showed mild apnea with an AHI with an AHI of 16.5/h and CPAP was ordered as an auto titration device.  He returns after HST : Calculated pAHI (per hour): The apnea hypopnea index overall was 16.5/h which constitutes mild sleep apnea.  REM sleep accentuated the apnea index slightly to 25.3/h.  In supine sleep we did not collect any data of apnea entities, the lowest apnea-hypopnea index was during prone sleep with an AHI of 13.6.  The patient slept 225 minutes in this position.   The snoring level however was loud and on average 44 dB.                           The machine he received has a minimum pressure of 6 and a maximum pressure of 16 cm water pressure but he only used it over the last 90 days on 15 days which makes a compliance of 16.7.  On those days he used the machine only 1 hour 53 minutes on average.  Even if that is all he sleeps he is required to use it 4 hours.  Once he does go to sleep he sleeps better with CPAP but he still has trouble initiating sleep.  Sometimes he feels that the mask is suffocating him that there is sudden gushes of too much pressure. He has an average air leak of 11.2 L/min.  His average pressure was 8.3 cmH2O.  Please note that the patient never  had an attended sleep study and the residual AHI is now 3.4/h of which 2.6 apneas are central in nature.  The patient endorsed the Epworth Sleepiness Scale at 8 points and the Fatigue Score at na points.    The patient's weight 232 pounds with a height of 70 (inches), resulting in a BMI of 33.5 kg/m2.  The patient's neck circumference measured 16 inches.  CURRENT MEDICATIONS: Aspirin, Vitamin D, Flaxseed oil, Flonase, Neurontin, Magnesium, Fish oil, Crestor, Zinc   PROCEDURE:  This is a multichannel digital polysomnogram utilizing the SomnoStar 11.2 system.  Electrodes and sensors were applied and monitored per AASM Specifications.   EEG, EOG, Chin and Limb EMG, were sampled at 200 Hz.  ECG, Snore and Nasal Pressure, Thermal Airflow, Respiratory Effort, CPAP Flow and Pressure, Oximetry was sampled at 50 Hz. Digital video and audio were recorded.       CPAP was initiated under a FFM ResMEd F20 in medium size at 8 cmH20 with heated humidity per AASM night standards and pressure was advanced to  10 cmH20 with 3 cm EPR  because of hypopneas, apneas and desaturations.  At a PAP pressure of 10 cmH20, EPR 3 cm,  there was a reduction of the AHI to 0.3/h with improvement of sleep apnea.  Lights Out was at 21:37 and Lights On at 05:00. Total recording time (TRT) was 443 minutes, with a total sleep time (TST) of 253 minutes. The patient's sleep latency was 79 minutes. REM latency was 328 minutes.   The sleep efficiency was 57.1 %.    SLEEP ARCHITECTURE: WASO (Wake after sleep onset)  was 112.5 minutes.  There were 24.5 minutes in Stage N1, 160 minutes Stage N2, 42.5 minutes Stage N3 and 26 minutes in Stage REM.  The percentage of Stage N1 was 9.7%, Stage N2 was 63.2%, Stage N3 was 16.8% and Stage R (REM sleep) was 10.3%. The sleep architecture was notable for REM onset late, at about 4.15 AM.   RESPIRATORY ANALYSIS:  There was a total of 3 respiratory events: 0 obstructive apneas, 0 central apneas and 0  mixed apneas with a total of 0 apneas and an apnea index (AI) of 0 /hour. There were 3 hypopneas with a hypopnea index of .7/hour.   The total APNEA/HYPOPNEA INDEX  (AHI) was 0.7 /hour .  0 events occurred in REM sleep and 3 events in NREM. The REM AHI was 0 /hour versus a non-REM AHI of .8 /hour.  The patient spent 62.5 minutes of total sleep time in the supine position and 191 minutes in non-supine.  The supine AHI was 1.9, versus a non-supine AHI of 0.3.  OXYGEN SATURATION & C02:  The baseline 02 saturation was 96%, with the lowest being 90%. Time spent below 89% saturation equaled 0 minutes.  PERIODIC LIMB MOVEMENTS:  The patient had a total of 0 Periodic Limb Movements. The arousals were noted as: 75 were spontaneous, 0 were associated with PLMs, 0 were associated with respiratory events.  Audio and video analysis did not show any abnormal or unusual movements, behaviors, phonations or vocalizations. There were many spontaneous arousals and the patient reported feeling stiff and needing to move.    EKG was in keeping with normal sinus rhythm (NSR).  DIAGNOSIS Obstructive Sleep Apnea was well controlled under 10 cm water CPAP with 3 cm EPR, a FFM was used in medium size. The patient slept but sleep was highly fragmented.  No Central Sleep Apnea was seen.  No Sleep Related Hypoxemia was noted.  Sleep arousals due to discomfort, stiffness.  PLANS/RECOMMENDATIONS: CPAP therapy compliance is defined as 4 hours of nightly use. The current autotitration device will be reset for 7 through 14 cm water pressure, 3 cm EPR and the patient will receive a medium size ResMed FFM F 20 .     DISCUSSION: A follow up appointment will be scheduled with NP in the Sleep Clinic at [facility].   Please call [phone] with any questions.      I certify that I have reviewed the entire raw data recording prior to the issuance of this report in accordance with the Standards of  Accreditation of the American Academy of Sleep Medicine (AASM)    Ziza Hastings, M.D. Medical Director, [facility] Diplomat, American Board of Neurology and Sleep Medicine ( Neurology and Sleep Medicine)    

## 2022-03-04 NOTE — Progress Notes (Signed)
PATIENT'S NAME:  [name], [name] DOB:      [birth year]      MR#:    [id number]     DATE OF RECORDING: 02/24/2022 Margaretann Loveless, RPSGT REFERRING M.D.:  Lucky Cowboy, MD Study Performed:   CPAP  Titration HISTORY:  RV on New CPAP : 10-16-2021: He does feel that CPAP therapy helps him sleep deeper and he wakes feeling more refreshed, but he can't go to sleep. He didn't tolerate Seroquel, he did change to gabapentin and that helped pain, some help with sleep.  [name] has been seen after his sleep study by Shawnie Dapper in November 2022.  he reports today that he has trouble using the new CPAP machine and also that he did not get filters and other supplies in time.  He has mild obstructive sleep apnea, a home sleep test in August that showed mild apnea with an AHI with an AHI of 16.5/h and CPAP was ordered as an auto titration device.  He returns after HST : Calculated pAHI (per hour): The apnea hypopnea index overall was 16.5/h which constitutes mild sleep apnea.  REM sleep accentuated the apnea index slightly to 25.3/h.  In supine sleep we did not collect any data of apnea entities, the lowest apnea-hypopnea index was during prone sleep with an AHI of 13.6.  The patient slept 225 minutes in this position.   The snoring level however was loud and on average 44 dB.                           The machine he received has a minimum pressure of 6 and a maximum pressure of 16 cm water pressure but he only used it over the last 90 days on 15 days which makes a compliance of 16.7.  On those days he used the machine only 1 hour 53 minutes on average.  Even if that is all he sleeps he is required to use it 4 hours.  Once he does go to sleep he sleeps better with CPAP but he still has trouble initiating sleep.  Sometimes he feels that the mask is suffocating him that there is sudden gushes of too much pressure. He has an average air leak of 11.2 L/min.  His average pressure was 8.3 cmH2O.  Please note that the patient never  had an attended sleep study and the residual AHI is now 3.4/h of which 2.6 apneas are central in nature.  The patient endorsed the Epworth Sleepiness Scale at 8 points and the Fatigue Score at na points.    The patient's weight 232 pounds with a height of 70 (inches), resulting in a BMI of 33.5 kg/m2.  The patient's neck circumference measured 16 inches.  CURRENT MEDICATIONS: Aspirin, Vitamin D, Flaxseed oil, Flonase, Neurontin, Magnesium, Fish oil, Crestor, Zinc   PROCEDURE:  This is a multichannel digital polysomnogram utilizing the SomnoStar 11.2 system.  Electrodes and sensors were applied and monitored per AASM Specifications.   EEG, EOG, Chin and Limb EMG, were sampled at 200 Hz.  ECG, Snore and Nasal Pressure, Thermal Airflow, Respiratory Effort, CPAP Flow and Pressure, Oximetry was sampled at 50 Hz. Digital video and audio were recorded.       CPAP was initiated under a FFM ResMEd F20 in medium size at 8 cmH20 with heated humidity per AASM night standards and pressure was advanced to  10 cmH20 with 3 cm EPR  because of hypopneas, apneas and desaturations.  At a PAP pressure of 10 cmH20, EPR 3 cm,  there was a reduction of the AHI to 0.3/h with improvement of sleep apnea.  Lights Out was at 21:37 and Lights On at 05:00. Total recording time (TRT) was 443 minutes, with a total sleep time (TST) of 253 minutes. The patient's sleep latency was 79 minutes. REM latency was 328 minutes.   The sleep efficiency was 57.1 %.    SLEEP ARCHITECTURE: WASO (Wake after sleep onset)  was 112.5 minutes.  There were 24.5 minutes in Stage N1, 160 minutes Stage N2, 42.5 minutes Stage N3 and 26 minutes in Stage REM.  The percentage of Stage N1 was 9.7%, Stage N2 was 63.2%, Stage N3 was 16.8% and Stage R (REM sleep) was 10.3%. The sleep architecture was notable for REM onset late, at about 4.15 AM.   RESPIRATORY ANALYSIS:  There was a total of 3 respiratory events: 0 obstructive apneas, 0 central apneas and 0  mixed apneas with a total of 0 apneas and an apnea index (AI) of 0 /hour. There were 3 hypopneas with a hypopnea index of .7/hour.   The total APNEA/HYPOPNEA INDEX  (AHI) was 0.7 /hour .  0 events occurred in REM sleep and 3 events in NREM. The REM AHI was 0 /hour versus a non-REM AHI of .8 /hour.  The patient spent 62.5 minutes of total sleep time in the supine position and 191 minutes in non-supine.  The supine AHI was 1.9, versus a non-supine AHI of 0.3.  OXYGEN SATURATION & C02:  The baseline 02 saturation was 96%, with the lowest being 90%. Time spent below 89% saturation equaled 0 minutes.  PERIODIC LIMB MOVEMENTS:  The patient had a total of 0 Periodic Limb Movements. The arousals were noted as: 75 were spontaneous, 0 were associated with PLMs, 0 were associated with respiratory events.  Audio and video analysis did not show any abnormal or unusual movements, behaviors, phonations or vocalizations. There were many spontaneous arousals and the patient reported feeling stiff and needing to move.    EKG was in keeping with normal sinus rhythm (NSR).  DIAGNOSIS Obstructive Sleep Apnea was well controlled under 10 cm water CPAP with 3 cm EPR, a FFM was used in medium size. The patient slept but sleep was highly fragmented.  No Central Sleep Apnea was seen.  No Sleep Related Hypoxemia was noted.  Sleep arousals due to discomfort, stiffness.  PLANS/RECOMMENDATIONS: CPAP therapy compliance is defined as 4 hours of nightly use. The current autotitration device will be reset for 7 through 14 cm water pressure, 3 cm EPR and the patient will receive a medium size ResMed FFM F 20 .     DISCUSSION: A follow up appointment will be scheduled with NP in the Sleep Clinic at [facility].   Please call [phone] with any questions.      I certify that I have reviewed the entire raw data recording prior to the issuance of this report in accordance with the Standards of  Accreditation of the American Academy of Sleep Medicine (AASM)    Melvyn Novas, M.D. Medical Director, [facility] [employer] [employer] [facility] ( Neurology and Sleep Medicine)

## 2022-03-04 NOTE — Telephone Encounter (Signed)
Called pt at [phone]. Relayed sleep study results per Dr. Oliva Bustard note. Pt verbalized understanding. Sent order to [employer] via community message. Scheduled f/u w/ AL,NP 03/31/22 at 730am.

## 2022-03-04 NOTE — Addendum Note (Signed)
Addended by: Melvyn Novas on: 03/04/2022 09:41 AM   Modules accepted: Orders

## 2022-03-04 NOTE — Telephone Encounter (Signed)
-----   Message from Melvyn Novas, MD sent at 03/04/2022  9:41 AM EDT ----- DIAGNOSIS 1. Obstructive Sleep Apnea was well controlled under 10 cm water CPAP with 3 cm EPR, a FFM was used in medium size. The patient slept but sleep was highly fragmented.  2. No Central Sleep Apnea was seen.  3. No Sleep Related Hypoxemia was noted.  Sleep arousals due to discomfort, stiffness.  PLANS/RECOMMENDATIONS: 1. CPAP therapy compliance is defined as 4 hours of nightly use. The current autotitration device will be reset for 7 through 14 cm water pressure, 3 cm EPR and the patient will receive a medium size ResMed FFM F 20 .     DISCUSSION: A follow up appointment will be scheduled with NP in the Sleep Clinic at [facility].   Please call [phone] with any questions.

## 2022-03-05 ENCOUNTER — Other Ambulatory Visit: Payer: Self-pay

## 2022-03-05 DIAGNOSIS — M2550 Pain in unspecified joint: Secondary | ICD-10-CM

## 2022-03-05 MED ORDER — MELOXICAM 7.5 MG PO TABS: 7.5000 mg | ORAL_TABLET | Freq: Every day | ORAL | 2 refills | Status: DC
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
## 2022-03-27 NOTE — Patient Instructions (Addendum)
Please continue using your CPAP regularly. While your insurance requires that you use CPAP at least 4 hours each night on 70% of the nights, I recommend, that you not skip any nights and use it throughout the night if you can. Getting used to CPAP and staying with the treatment long term does take time and patience and discipline. Untreated obstructive sleep apnea when it is moderate to severe can have an adverse impact on cardiovascular health and raise her risk for heart disease, arrhythmias, hypertension, congestive heart failure, stroke and diabetes. Untreated obstructive sleep apnea causes sleep disruption, nonrestorative sleep, and sleep deprivation. This can have an impact on your day to day functioning and cause daytime sleepiness and impairment of cognitive function, memory loss, mood disturbance, and problems focussing. Using CPAP regularly can improve these symptoms.   Continue working on compliance.   Follow up in 6 months

## 2022-03-31 ENCOUNTER — Ambulatory Visit (INDEPENDENT_AMBULATORY_CARE_PROVIDER_SITE_OTHER): Payer: Commercial Managed Care - HMO | Admitting: Family Medicine

## 2022-03-31 ENCOUNTER — Encounter: Payer: Self-pay | Admitting: Family Medicine

## 2022-03-31 VITALS — BP 133/75 | HR 65 | Ht 69.5 in | Wt 227.5 lb

## 2022-03-31 DIAGNOSIS — F5104 Psychophysiologic insomnia: Secondary | ICD-10-CM

## 2022-03-31 DIAGNOSIS — Z9989 Dependence on other enabling machines and devices: Secondary | ICD-10-CM | POA: Diagnosis not present

## 2022-03-31 DIAGNOSIS — G4733 Obstructive sleep apnea (adult) (pediatric): Secondary | ICD-10-CM | POA: Diagnosis not present

## 2022-04-03 ENCOUNTER — Other Ambulatory Visit: Payer: Self-pay | Admitting: Neurology

## 2022-04-13 ENCOUNTER — Encounter: Payer: Self-pay | Admitting: Internal Medicine

## 2022-04-13 NOTE — Patient Instructions (Signed)

## 2022-04-13 NOTE — Progress Notes (Unsigned)
Annual  Screening/Preventative Visit  & Comprehensive Evaluation & Examination  Future Appointments  Date Time Provider Department  04/14/2022 10:00 AM Lucky Cowboy, MD [facility]  09/29/2022 10:30 AM Dohmeier, Porfirio Mylar, MD [facility]  04/17/2023 10:00 AM Lucky Cowboy, MD [facility]              This very nice 61 y.o. [name] presents for a Screening /Preventative Visit & comprehensive evaluation and management of multiple medical co-morbidities.  Patient has been followed for HTN, HLD, Prediabetes and Vitamin D Deficiency.     Patient is followed by Dr Vickey Huger  for OSA & CPAP sporadically.   In 2012, he was Dx'd with ITP and was treated by Dr Gaylyn Rong and was released as he went into remission.       Patient has been followed for labile HTN predates circa 2012 . Patient's BP has been controlled at home.  Today's BP is at goal - 135/85. Patient denies any cardiac symptoms as chest pain, palpitations, shortness of breath, dizziness or ankle swelling.       Patient's hyperlipidemia is controlled with diet and Rosuvastatin.  Patient denies myalgias or other medication SE's. Last lipids were at goal except elevated Trig's :  Lab Results  Component Value Date   CHOL 101 10/23/2021   HDL 26 (L) 10/23/2021   LDLCALC 52 10/23/2021   TRIG 152 (H) 10/23/2021   CHOLHDL 3.9 10/23/2021         Patient has hx/o prediabetes (A1c 5.9%  /2012) and patient denies reactive hypoglycemic symptoms, visual blurring, diabetic polys or paresthesias. Last A1c was normal & at goal :   Lab Results  Component Value Date   HGBA1C 5.5 04/12/2021        Finally, patient has history of Vitamin D Deficiency  ("20" /2011) and last vitamin D was at goal :   Lab Results  Component Value Date   VD25OH 79 04/12/2021       Current Outpatient Medications on File Prior to Visit  Medication Sig   ASPIRIN 81 PO Take daily.   VITAMIN D 10,000 Units  Take  daily.    FLAXSEED OIL 1200 MG CAPS Take 1 capsule  daily.     FLONASE  nasal spray Use 1 to 2 sprays each Nostril 1 to 2 x /day   gabapentin 300 MG capsule Take  1 capsule  3 x /day  as needed for Neuropathy Pains   Magnesium 500 MG TABS Take  daily.   Omega-3 FISH OIL 1200 MG CAPS Take 1 capsule  daily.    rosuvastatin 40 MG tablet Take 1 tablet daily  for Cholesterol   Zinc 50 MG TABS Take  daily.      Allergies  Allergen Reactions   Niacin And Related Swelling    Red rash and swelling   Gemfibrozil    Penicillins Other (See Comments)    Unknown childhood allergy Received 2 Gms Ancef with no obvious reaction     Past Medical History:  Diagnosis Date   Acid reflux    Arthritis    Blood dyscrasia    ITP   Bruise 08/08/11   pt states brusing x 1 month   Chest pain 08/08/2011   "a little in last month"   Disorder of both ears 08/08/2011   blood in ears in past month - put on steroids, "tubes stopped up"   Fracture of left clavicle 02/08/2015   Hypercholesteremia    Hypertension, Labile 11/03/2013  no meds   Idiopathic thrombocytopenic purpura (ITP) (HCC)    Leg cramps 08/08/2011   bad in last 1.5 months   Morbid obesity (HCC)    Prediabetes    Sleep apnea    wears CPAP "sometimes"   Tick bites 08/08/11   60 to 70 ticks in last hunting season , last > 1 month     Health Maintenance  Topic Date Due   Zoster Vaccines- Shingrix (1 of 2) Never done   Pneumococcal Vaccine 24-76 Years old (2 - PCV) 05/19/2013   COVID-19 Vaccine  09/25/2020   INFLUENZA VACCINE  03/18/2021   TETANUS/TDAP  02/03/2025   COLONOSCOPY  04/02/2027   Hepatitis C Screening  Completed   HIV Screening  Completed   HPV VACCINES  Aged Out     Immunization History  Administered Date(s) Administered   Influenza Inj Mdck Quad  05/19/2018   Influenza Split 05/24/2013, 06/22/2014, 04/18/2017   Influenza, Seasonal 07/03/2015   Influenza,inj,Quad  07/06/2017, 05/10/2019   Influenza,inj,quad 08/07/2016   Influenza 05/19/2018, 06/25/2020   PFIZER  SARS-COV-2 Vacc 11/01/2019, 11/22/2019, 06/25/2020   PD Test 08/07/2016, 09/09/2017   Pneumococcal -23 05/19/2012   Td 08/18/2008, 02/04/2015    Last Colon - 04/01/2017 - Dr Ewing Schlein Recc f/u 4-5 years ( 2022-2023)   Past Surgical History:  Procedure Laterality Date   BACK SURGERY  07/2010   lumbar - 3 discs   FRACTURE SURGERY     right foot fracture, no surgery   LUMBAR DISC SURGERY  2011   ORIF CLAVICULAR FRACTURE Left 02/08/2015   Procedure: OPEN REDUCTION INTERNAL FIXATION (ORIF) LEFT CLAVICULAR FRACTURE;  Surgeon: Teryl Lucy, MD;  Location: [facility];  Service: Orthopedics;  Laterality: Left;     Family History  Problem Relation Age of Onset   Uterine cancer Mother    Uterine cancer Sister    Stroke Neg Hx     Social History   Socioeconomic History   Marital status: Single   Years of education: 10   Highest education level: Not on file  Occupational History   Occupation: [employer]   Tobacco Use   Smoking status: Former    Packs/day: 1.00    Years: 8.00    Pack years: 8.00    Types: Cigarettes    Quit date: 01/03/2015    Years since quitting: 6.2   Smokeless tobacco: Current    Types: Chew   Tobacco comments:    smokes 1-2 cigarettes daily  Substance and Sexual Activity   Alcohol use: Yes    Alcohol/week: 0.0 standard drinks   Drug use: No    Comment: occasionally chews tobacco   Sexual activity: Yes      ROS Constitutional: Denies fever, chills, weight loss/gain, headaches, insomnia,  night sweats or change in appetite. Does c/o fatigue. Eyes: Denies redness, blurred vision, diplopia, discharge, itchy or watery eyes.  ENT: Denies discharge, congestion, post nasal drip, epistaxis, sore throat, earache, hearing loss, dental pain, Tinnitus, Vertigo, Sinus pain or snoring.  Cardio: Denies chest pain, palpitations, irregular heartbeat, syncope, dyspnea, diaphoresis, orthopnea, PND, claudication or edema Respiratory: denies cough,  dyspnea, DOE, pleurisy, hoarseness, laryngitis or wheezing.  Gastrointestinal: Denies dysphagia, heartburn, reflux, water brash, pain, cramps, nausea, vomiting, bloating, diarrhea, constipation, hematemesis, melena, hematochezia, jaundice or hemorrhoids Genitourinary: Denies dysuria, frequency, urgency, nocturia, hesitancy, discharge, hematuria or flank pain Musculoskeletal: Denies arthralgia, myalgia, stiffness, Jt. Swelling, pain, limp or strain/sprain. Denies Falls. Skin: Denies puritis, rash, hives, warts, acne,  eczema or change in skin lesion Neuro: No weakness, tremor, incoordination, spasms, paresthesia or pain Psychiatric: Denies confusion, memory loss or sensory loss. Denies Depression. Endocrine: Denies change in weight, skin, hair change, nocturia, and paresthesia, diabetic polys, visual blurring or hyper / hypo glycemic episodes.  Heme/Lymph: No excessive bleeding, bruising or enlarged lymph nodes.   Physical Exam  BP 135/85   Pulse 61   Temp 97.9 F (36.6 C)   Resp 16   Ht 5' 9.5" (1.765 m)   Wt 216 lb 6.4 oz (98.2 kg)   SpO2 98%   BMI 31.50 kg/m   General Appearance: Well nourished and well groomed and in no apparent distress.  Eyes: PERRLA, EOMs, conjunctiva no swelling or erythema, normal fundi and vessels. Sinuses: No frontal/maxillary tenderness ENT/Mouth: EACs patent / TMs  nl. Nares clear without erythema, swelling, mucoid exudates. Oral hygiene is good. No erythema, swelling, or exudate. Tongue normal, non-obstructing. Tonsils not swollen or erythematous. Hearing normal.  Neck: Supple, thyroid not palpable. No bruits, nodes or JVD. Respiratory: Respiratory effort normal.  BS equal and clear bilateral without rales, rhonci, wheezing or stridor. Cardio: Heart sounds are normal with regular rate and rhythm and no murmurs, rubs or gallops. Peripheral pulses are normal and equal bilaterally without edema. No aortic or femoral bruits. Chest: symmetric with normal  excursions and percussion.  Abdomen: Soft, with Nl bowel sounds. Nontender, no guarding, rebound, hernias, masses, or organomegaly.  Lymphatics: Non tender without lymphadenopathy.  Musculoskeletal: Full ROM all peripheral extremities, joint stability, 5/5 strength, and normal gait. Skin: Warm and dry without rashes, lesions, cyanosis, clubbing or  ecchymosis.  Neuro: Cranial nerves intact, reflexes equal bilaterally. Normal muscle tone, no cerebellar symptoms. Sensation intact.  Pysch: Alert and oriented X 3 with normal affect, insight and judgment appropriate.   Assessment and Plan  1. Annual Preventative/Screening Exam    2. Labile hypertension  - EKG 12-Lead - [place], RETROPERITNL ABD,  LTD - Urinalysis, Routine w reflex microscopic - Microalbumin / creatinine urine ratio - CBC with Differential/Platelet - COMPLETE METABOLIC PANEL WITH GFR - Magnesium - TSH  3. Hyperlipidemia, mixed  - EKG 12-Lead - [place], RETROPERITNL ABD,  LTD - Lipid panel - TSH  4. Abnormal glucose  - EKG 12-Lead - [place], RETROPERITNL ABD,  LTD - Hemoglobin A1c - Insulin, random  5. Vitamin D deficiency  - VITAMIN D 25 Hydroxy   6. Prediabetes  - EKG 12-Lead - [place], RETROPERITNL ABD,  LTD - Hemoglobin A1c - Insulin, random  7. OSA (obstructive sleep apnea)   8. Screening-pulmonary TB  - TB Skin Test  9. Prostate cancer screening  - PSA  10. Screening for colorectal cancer  - POC Hemoccult Bld/Stl   11. Screening for heart disease  - EKG 12-Lead  12. Family history of stroke  - EKG 12-Lead - [place], RETROPERITNL ABD,  LTD  13. Former smoker  - EKG 12-Lead - [place], RETROPERITNL ABD,  LTD  14. Screening for AAA (aortic abdominal aneurysm)  - [place], RETROPERITNL ABD,  LTD - Iron, Total/Total Iron Binding Cap - Vitamin B12 - Testosterone - CBC with Differential/Platelet - COMPLETE METABOLIC PANEL WITH GFR - TSH  15. Fatigue, unspecified type  - Urinalysis, Routine w reflex  microscopic - Microalbumin / creatinine urine ratio - CBC with Differential/Platelet - COMPLETE METABOLIC PANEL WITH GFR - Magnesium - Lipid panel - TSH - Hemoglobin A1c - Insulin, random - VITAMIN D 25 Hydroxy  Patient was counseled in prudent diet, weight control to achieve /maintain BMI less than 25, BP monitoring, regular exercise and medications as discussed.  Discussed med effects and SE's. Routine screening labs and tests as requested with regular follow-up as recommended. Over 40 minutes of exam, counseling, chart review and high complex critical decision making was performed   Marinus Maw, MD

## 2022-04-14 ENCOUNTER — Encounter: Payer: Self-pay | Admitting: Internal Medicine

## 2022-04-14 ENCOUNTER — Ambulatory Visit (INDEPENDENT_AMBULATORY_CARE_PROVIDER_SITE_OTHER): Payer: Commercial Managed Care - HMO | Admitting: Internal Medicine

## 2022-04-14 VITALS — BP 135/85 | HR 61 | Temp 97.9°F | Resp 16 | Ht 69.5 in | Wt 216.4 lb

## 2022-04-14 DIAGNOSIS — Z1329 Encounter for screening for other suspected endocrine disorder: Secondary | ICD-10-CM | POA: Diagnosis not present

## 2022-04-14 DIAGNOSIS — Z87891 Personal history of nicotine dependence: Secondary | ICD-10-CM | POA: Diagnosis not present

## 2022-04-14 DIAGNOSIS — Z13220 Encounter for screening for lipoid disorders: Secondary | ICD-10-CM

## 2022-04-14 DIAGNOSIS — E559 Vitamin D deficiency, unspecified: Secondary | ICD-10-CM

## 2022-04-14 DIAGNOSIS — Z1389 Encounter for screening for other disorder: Secondary | ICD-10-CM

## 2022-04-14 DIAGNOSIS — Z0000 Encounter for general adult medical examination without abnormal findings: Secondary | ICD-10-CM | POA: Diagnosis not present

## 2022-04-14 DIAGNOSIS — Z79899 Other long term (current) drug therapy: Secondary | ICD-10-CM

## 2022-04-14 DIAGNOSIS — Z823 Family history of stroke: Secondary | ICD-10-CM

## 2022-04-14 DIAGNOSIS — Z130 Encounter for screening for diseases of the blood and blood-forming organs and certain disorders involving the immune mechanism: Secondary | ICD-10-CM

## 2022-04-14 DIAGNOSIS — R7303 Prediabetes: Secondary | ICD-10-CM

## 2022-04-14 DIAGNOSIS — N401 Enlarged prostate with lower urinary tract symptoms: Secondary | ICD-10-CM

## 2022-04-14 DIAGNOSIS — E782 Mixed hyperlipidemia: Secondary | ICD-10-CM

## 2022-04-14 DIAGNOSIS — Z111 Encounter for screening for respiratory tuberculosis: Secondary | ICD-10-CM | POA: Diagnosis not present

## 2022-04-14 DIAGNOSIS — R350 Frequency of micturition: Secondary | ICD-10-CM | POA: Diagnosis not present

## 2022-04-14 DIAGNOSIS — G4733 Obstructive sleep apnea (adult) (pediatric): Secondary | ICD-10-CM

## 2022-04-14 DIAGNOSIS — Z125 Encounter for screening for malignant neoplasm of prostate: Secondary | ICD-10-CM

## 2022-04-14 DIAGNOSIS — R7309 Other abnormal glucose: Secondary | ICD-10-CM

## 2022-04-14 DIAGNOSIS — R0989 Other specified symptoms and signs involving the circulatory and respiratory systems: Secondary | ICD-10-CM

## 2022-04-14 DIAGNOSIS — Z136 Encounter for screening for cardiovascular disorders: Secondary | ICD-10-CM | POA: Diagnosis not present

## 2022-04-14 DIAGNOSIS — I700 Atherosclerosis of aorta: Secondary | ICD-10-CM

## 2022-04-14 DIAGNOSIS — Z0001 Encounter for general adult medical examination with abnormal findings: Secondary | ICD-10-CM

## 2022-04-14 DIAGNOSIS — Z131 Encounter for screening for diabetes mellitus: Secondary | ICD-10-CM | POA: Diagnosis not present

## 2022-04-14 DIAGNOSIS — Z1211 Encounter for screening for malignant neoplasm of colon: Secondary | ICD-10-CM

## 2022-04-14 DIAGNOSIS — R5383 Other fatigue: Secondary | ICD-10-CM

## 2022-04-15 LAB — CBC WITH DIFFERENTIAL/PLATELET
Absolute Monocytes: 570 cells/uL (ref 200–950)
Basophils Absolute: 67 cells/uL (ref 0–200)
Basophils Relative: 0.7 %
Eosinophils Absolute: 285 cells/uL (ref 15–500)
Eosinophils Relative: 3 %
HCT: 45.3 % (ref 38.5–50.0)
Hemoglobin: 15.6 g/dL (ref 13.2–17.1)
Lymphs Abs: 2527 cells/uL (ref 850–3900)
MCH: 31.1 pg (ref 27.0–33.0)
MCHC: 34.4 g/dL (ref 32.0–36.0)
MCV: 90.4 fL (ref 80.0–100.0)
MPV: 10.7 fL (ref 7.5–12.5)
Monocytes Relative: 6 %
Neutro Abs: 6052 cells/uL (ref 1500–7800)
Neutrophils Relative %: 63.7 %
Platelets: 163 10*3/uL (ref 140–400)
RBC: 5.01 10*6/uL (ref 4.20–5.80)
RDW: 12.8 % (ref 11.0–15.0)
Total Lymphocyte: 26.6 %
WBC: 9.5 10*3/uL (ref 3.8–10.8)

## 2022-04-15 LAB — COMPLETE METABOLIC PANEL WITH GFR
AG Ratio: 1.8 (calc) (ref 1.0–2.5)
ALT: 18 U/L (ref 9–46)
AST: 16 U/L (ref 10–35)
Albumin: 4.8 g/dL (ref 3.6–5.1)
Alkaline phosphatase (APISO): 92 U/L (ref 35–144)
BUN: 13 mg/dL (ref 7–25)
CO2: 24 mmol/L (ref 20–32)
Calcium: 9.8 mg/dL (ref 8.6–10.3)
Chloride: 106 mmol/L (ref 98–110)
Creat: 1.03 mg/dL (ref 0.70–1.35)
Globulin: 2.6 g/dL (calc) (ref 1.9–3.7)
Glucose, Bld: 91 mg/dL (ref 65–99)
Potassium: 4.5 mmol/L (ref 3.5–5.3)
Sodium: 140 mmol/L (ref 135–146)
Total Bilirubin: 0.4 mg/dL (ref 0.2–1.2)
Total Protein: 7.4 g/dL (ref 6.1–8.1)
eGFR: 83 mL/min/{1.73_m2} (ref >=60)

## 2022-04-15 LAB — INSULIN, RANDOM: Insulin: 20.8 u[IU]/mL — ABNORMAL HIGH

## 2022-04-15 LAB — LIPID PANEL
Cholesterol: 123 mg/dL (ref <200)
HDL: 25 mg/dL — ABNORMAL LOW (ref >=40)
LDL Cholesterol (Calc): 66 mg/dL (calc)
Non-HDL Cholesterol (Calc): 98 mg/dL (calc) (ref <130)
Total CHOL/HDL Ratio: 4.9 (calc) (ref <5.0)
Triglycerides: 300 mg/dL — ABNORMAL HIGH (ref <150)

## 2022-04-15 LAB — URINALYSIS, ROUTINE W REFLEX MICROSCOPIC
Bilirubin Urine: NEGATIVE
Glucose, UA: NEGATIVE
Hgb urine dipstick: NEGATIVE
Ketones, ur: NEGATIVE
Leukocytes,Ua: NEGATIVE
Nitrite: NEGATIVE
Protein, ur: NEGATIVE
Specific Gravity, Urine: 1.01 (ref 1.001–1.035)
pH: 6.5 (ref 5.0–8.0)

## 2022-04-15 LAB — MICROALBUMIN / CREATININE URINE RATIO
Creatinine, Urine: 59 mg/dL (ref 20–320)
Microalb Creat Ratio: 3 mcg/mg creat (ref <30)
Microalb, Ur: 0.2 mg/dL

## 2022-04-15 LAB — HEMOGLOBIN A1C
Hgb A1c MFr Bld: 5.4 % of total Hgb (ref <5.7)
Mean Plasma Glucose: 108 mg/dL
eAG (mmol/L): 6 mmol/L

## 2022-04-15 LAB — IRON, TOTAL/TOTAL IRON BINDING CAP
%SAT: 20 % (calc) (ref 20–48)
Iron: 79 ug/dL (ref 50–180)
TIBC: 387 mcg/dL (calc) (ref 250–425)

## 2022-04-15 LAB — PSA: PSA: 0.22 ng/mL (ref <=4.00)

## 2022-04-15 LAB — MAGNESIUM: Magnesium: 2.2 mg/dL (ref 1.5–2.5)

## 2022-04-15 LAB — TSH: TSH: 2.79 mIU/L (ref 0.40–4.50)

## 2022-04-15 LAB — TESTOSTERONE: Testosterone: 527 ng/dL (ref 250–827)

## 2022-04-15 LAB — VITAMIN B12: Vitamin B-12: 1872 pg/mL — ABNORMAL HIGH (ref 200–1100)

## 2022-04-15 LAB — VITAMIN D 25 HYDROXY (VIT D DEFICIENCY, FRACTURES): Vit D, 25-Hydroxy: 68 ng/mL (ref 30–100)

## 2022-04-15 NOTE — Progress Notes (Signed)
<><><><><><><><><><><><><><><><><><><><><><><><><><><><><><><><><> <><><><><><><><><><><><><><><><><><><><><><><><><><><><><><><><><>  -   Vitamin B12 level is extremely High - So  . . .  Please cut your dose down to 1 x  /day  <><><><><><><><><><><><><><><><><><><><><><><><><><><><><><><><><> <><><><><><><><><><><><><><><><><><><><><><><><><><><><><><><><><>  -  Total Chol = 123 m-  Excellent   - Very low risk for Heart Attack  / Stroke <><><><><><><><><><><><><><><><><><><><><><><><><><><><><><><><><>  - But,  Triglycerides (  300  ) or fats in blood are too high                 (   Ideal or  Goal is less than 150  !  )    - Recommend avoid fried & greasy foods,  sweets / candy,   - Avoid white rice  (brown or wild rice or Quinoa is OK),   - Avoid white potatoes  (sweet potatoes are OK)   - Avoid anything made from white flour  - bagels, doughnuts, rolls, buns, biscuits, white and   wheat breads, pizza crust and traditional  pasta made of white flour & egg white  - (vegetarian pasta or spinach or wheat pasta is OK).    - Multi-grain bread is OK - like multi-grain flat bread or  sandwich thins.   - Avoid alcohol in excess.   - Exercise is also important. <><><><><><><><><><><><><><><><><><><><><><><><><><><><><><><><><>  - Iron levels Normal  <><><><><><><><><><><><><><><><><><><><><><><><><><><><><><><><><>  - PSA - Low - Great  <><><><><><><><><><><><><><><><><><><><><><><><><><><><><><><><><>  - Vitamin D = 68 - Great - Please keep dose same  <><><><><><><><><><><><><><><><><><><><><><><><><><><><><><><><><>  - All Else - CBC - Kidneys - Electrolytes - Liver - Magnesium & Thyroid    - all  Normal / OK <><><><><><><><><><><><><><><><><><><><><><><><><><><><><><><><><>  - Keep up the [place] Work   !\  <><><><><><><><><><><><><><><><><><><><><><><><><><><><><><><><><> <><><><><><><><><><><><><><><><><><><><><><><><><><><><><><><><><>                 ]

## 2022-04-16 ENCOUNTER — Encounter: Payer: Commercial Managed Care - HMO | Admitting: Internal Medicine

## 2022-04-16 ENCOUNTER — Telehealth: Payer: Self-pay | Admitting: Family Medicine

## 2022-04-16 NOTE — Telephone Encounter (Signed)
[name], please send his psychology referral from Amy to:  [facility] [phone] and their fax for referrals is [phone]

## 2022-04-16 NOTE — Progress Notes (Signed)
Patient is aware of lab results and instructions. -e welch

## 2022-04-17 ENCOUNTER — Telehealth: Payer: Self-pay | Admitting: Family Medicine

## 2022-04-17 NOTE — Telephone Encounter (Signed)
Referral sent to [facility] [phone] Tori ask for help with a request received from Amy, NP to send a referral to psychology.

## 2022-06-04 ENCOUNTER — Other Ambulatory Visit: Payer: Self-pay | Admitting: Nurse Practitioner

## 2022-06-04 DIAGNOSIS — M2550 Pain in unspecified joint: Secondary | ICD-10-CM

## 2022-07-17 ENCOUNTER — Ambulatory Visit: Payer: Commercial Managed Care - HMO | Admitting: Nurse Practitioner

## 2022-07-22 ENCOUNTER — Ambulatory Visit (INDEPENDENT_AMBULATORY_CARE_PROVIDER_SITE_OTHER): Payer: Commercial Managed Care - HMO | Admitting: Nurse Practitioner

## 2022-07-22 ENCOUNTER — Encounter: Payer: Self-pay | Admitting: Nurse Practitioner

## 2022-07-22 VITALS — BP 130/82 | HR 55 | Temp 97.2°F | Ht 69.5 in | Wt 223.0 lb

## 2022-07-22 DIAGNOSIS — G4733 Obstructive sleep apnea (adult) (pediatric): Secondary | ICD-10-CM

## 2022-07-22 DIAGNOSIS — R0989 Other specified symptoms and signs involving the circulatory and respiratory systems: Secondary | ICD-10-CM | POA: Diagnosis not present

## 2022-07-22 DIAGNOSIS — E782 Mixed hyperlipidemia: Secondary | ICD-10-CM | POA: Diagnosis not present

## 2022-07-22 DIAGNOSIS — E669 Obesity, unspecified: Secondary | ICD-10-CM

## 2022-07-22 DIAGNOSIS — M25519 Pain in unspecified shoulder: Secondary | ICD-10-CM

## 2022-07-22 DIAGNOSIS — R7303 Prediabetes: Secondary | ICD-10-CM | POA: Diagnosis not present

## 2022-07-22 DIAGNOSIS — N200 Calculus of kidney: Secondary | ICD-10-CM

## 2022-07-22 DIAGNOSIS — G4700 Insomnia, unspecified: Secondary | ICD-10-CM

## 2022-07-22 DIAGNOSIS — K2100 Gastro-esophageal reflux disease with esophagitis, without bleeding: Secondary | ICD-10-CM

## 2022-07-22 DIAGNOSIS — J3089 Other allergic rhinitis: Secondary | ICD-10-CM

## 2022-07-22 DIAGNOSIS — F5104 Psychophysiologic insomnia: Secondary | ICD-10-CM

## 2022-07-22 MED ORDER — BUSPIRONE HCL 5 MG PO TABS: ORAL_TABLET | ORAL | 2 refills | Status: DC

## 2022-07-22 MED ORDER — HYDROXYZINE PAMOATE 25 MG PO CAPS: 25.0000 mg | ORAL_CAPSULE | Freq: Every evening | ORAL | 2 refills | Status: DC | PRN

## 2022-07-22 NOTE — Progress Notes (Signed)
FOLLOW UP  Assessment and Plan:   Hypertension Discussed DASH (Dietary Approaches to Stop Hypertension) DASH diet is lower in sodium than a typical American diet. Cut back on foods that are high in saturated fat, cholesterol, and trans fats. Eat more whole-grain foods, fish, poultry, and nuts Remain active and exercise as tolerated daily.  Monitor BP at home-Call if greater than 130/80.  Check CMP/CBC   Cholesterol Discussed lifestyle modifications. Recommended diet heavy in fruits and veggies, omega 3's. Decrease consumption of animal meats, cheeses, and dairy products. Remain active and exercise as tolerated. Continue to monitor. Check lipids/TSH  Obesity with co morbidities Discussed appropriate BMI Diet modification. Physical activity. Encouraged/praised to build confidence.  Prediabetes Education: Reviewed 'ABCs' of diabetes management  Discussed goals to be met and/or maintained include A1C (<7) Blood pressure (<130/80) Cholesterol (LDL <70) Continue Eye Exam yearly  Continue Dental Exam Q6 mo Discussed dietary recommendations Discussed Physical Activity recommendations Check A1C  OSA on CPAP Continue CPAP Discussed weight management  GERD Continue medications PRN. No suspected reflux complications ([place]/stricture). Lifestyle modification:  wt loss, avoid meals 2-3h before bedtime. Consider eliminating food triggers:  chocolate, caffeine, EtOH, acid/spicy food.  Arthralgia of shoulder, unspecified laterality Improved and stable. Continue Meloxicam 7.5 mg daily PRN Take with H2 blocker such as Pepcid/Prilosec RICE method when flared. Alternate Ice/Heat. Contact office if s/s worsen.  Nephrolithiasis No recent flare Continue to stay well hydrated.  Medication management All medications discussed and reviewed in full. All questions and concerns regarding medications addressed.    Environmental allergies Restart Singulair Avoid  triggers  Chronic insomnia Start Buspirone as directed. Discussed good sleep hygiene. Establish bed and wake times. Sleep restriction-only sleep estimated hrs sleep. Bed only for sex and sleep, only sleep when sleepy, out of bed if anxious (stimulus control). Reviewed relaxation techniques, mindful meditations. Expected sleep duration. Addressed worries about not sleeping.    Orders Placed This Encounter  Procedures   CBC with Differential/Platelet   COMPLETE METABOLIC PANEL WITH GFR   Meds ordered this encounter  Medications   hydrOXYzine (VISTARIL) 25 MG capsule    Sig: Take 1 capsule (25 mg total) by mouth at bedtime as needed.    Dispense:  90 capsule    Refill:  2    Order Specific Question:   Supervising Provider    Answer:   Unk Pinto [[id number]]   busPIRone (BUSPAR) 5 MG tablet    Sig: Take     1/2 to 1 tablet     3 x /day      for Anxiety    Dispense:  90 tablet    Refill:  2    Order Specific Question:   Supervising Provider    Answer:   Unk Pinto [phone]     Notify office for further evaluation and treatment, questions or concerns if any reported s/s fail to improve.   The patient was advised to call back or seek an in-person evaluation if any symptoms worsen or if the condition fails to improve as anticipated.   Further disposition pending results of labs. Discussed med's effects and SE's.    I discussed the assessment and treatment plan with the patient. The patient was provided an opportunity to ask questions and all were answered. The patient agreed with the plan and demonstrated an understanding of the instructions.  Discussed med's effects and SE's. Screening labs and tests as requested with regular follow-up as recommended.  I provided 20 minutes of face-to-face time during  this encounter including counseling, chart review, and critical decision making was preformed.   Future Appointments  Date Time Provider [place]  09/29/2022  10:30 AM Dohmeier, Asencion Partridge, MD [facility] None  10/22/2022  9:30 AM Unk Pinto, MD [facility] None  04/17/2023 10:00 AM Unk Pinto, MD [facility] None    ----------------------------------------------------------------------------------------------------------------------  HPI 61 y.o. male  presents for 3 month follow up on hypertension, cholesterol, diabetes, weight and vitamin D deficiency.   Overall he reports feeling well.    He states that he stopped taking Hydroxyzine for control of environmental allergies but he realizes that his symptoms are worse without the medication.  He is asking to restart.  Continues to have insomnia.  He used to take Xanax 1 mg but has weaned off.  He is continuing to have nightly events of having trouble falling asleep and staying asleep.  This can cause him anxiety during the day d/t being tired and having to work.  BMI is Body mass index is 32.46 kg/m., he has not been working on diet and exercise. Wt Readings from Last 3 Encounters:  07/22/22 223 lb (101.2 kg)  04/14/22 216 lb 6.4 oz (98.2 kg)  03/31/22 227 lb 8 oz (103.2 kg)    His blood pressure has been controlled at home, today their BP is BP: 130/82  He does not workout. He denies chest pain, shortness of breath, dizziness.  States that he has cut out salt from his diet.  He no longer endorses swelling in the feet.    He is on cholesterol medication Rosuvastatin and denies myalgias. His cholesterol is at goal. The cholesterol last visit was:   Lab Results  Component Value Date   CHOL 123 04/14/2022   HDL 25 (L) 04/14/2022   LDLCALC 66 04/14/2022   TRIG 300 (H) 04/14/2022   CHOLHDL 4.9 04/14/2022    He has not been working on diet and exercise for prediabetes, and denies polydipsia and polyuria. Last A1C in the office was:  Lab Results  Component Value Date   HGBA1C 5.4 04/14/2022   Patient is on Vitamin D supplement.   Lab Results  Component Value Date   VD25OH 68 04/14/2022         Current Medications:  Current Outpatient Medications on File Prior to Visit  Medication Sig   ASPIRIN 81 PO Take by mouth daily.   Cholecalciferol (VITAMIN D PO) Take 10,000 Int'l Units by mouth daily.    diclofenac Sodium (VOLTAREN) 1 % GEL Apply 4 g topically 4 (four) times daily.   Flaxseed, Linseed, (FLAXSEED OIL) 1200 MG CAPS Take 1 capsule by mouth daily.    fluticasone (FLONASE) 50 MCG/ACT nasal spray SPRAY 1 TO 2 SPRAYS IN EACH NOSTRIL 1 TO 2 TIMES DAILY   gabapentin (NEURONTIN) 300 MG capsule TAKE 1 CAPSULE 3 TIMES DAILY AS NEEDED FOR NEUROPATHY PAINS   Magnesium 500 MG TABS Take by mouth daily.   meloxicam (MOBIC) 7.5 MG tablet TAKE 1 TABLET BY MOUTH EVERY DAY   Omega-3 Fatty Acids (FISH OIL) 1200 MG CAPS Take 1 capsule by mouth daily.    rosuvastatin (CRESTOR) 40 MG tablet TAKE 1 TABLET BY MOUTH EVERY DAY FOR CHOLESTEROL   Zinc 50 MG TABS Take by mouth daily.   econazole nitrate 1 % cream Apply topically daily. (Patient not taking: Reported on 07/22/2022)   traZODone (DESYREL) 50 MG tablet Take 1 tablet (50 mg total) by mouth at bedtime. (Patient not taking: Reported on 07/22/2022)   No  current facility-administered medications on file prior to visit.     Allergies:  Allergies  Allergen Reactions   Niacin And Related Swelling    Red rash and swelling   Gemfibrozil    Penicillins Other (See Comments)    Unknown childhood allergy Received 2 Gms Ancef with no obvious reaction     Medical History:  Past Medical History:  Diagnosis Date   Acid reflux    Arthritis    Blood dyscrasia    ITP   Bruise 08/08/11   pt states brusing x 1 month   Chest pain 08/08/2011   "a little in last month"   Disorder of both ears 08/08/2011   blood in ears in past month - put on steroids, "tubes stopped up"   Fracture of left clavicle 02/08/2015   Hypercholesteremia    Hypertension, Labile 11/03/2013   no meds   Idiopathic thrombocytopenic purpura (ITP) (HCC)    Leg cramps  08/08/2011   bad in last 1.5 months   Morbid obesity ([place])    Prediabetes    Sleep apnea    wears CPAP "sometimes"   Tick bites 08/08/11   60 to 70 ticks in last hunting season , last > 1 month   Family history- Reviewed and unchanged Social history- Reviewed and unchanged   Review of Systems:  ROS    Physical Exam: BP 130/82   Pulse (!) 55   Temp (!) 97.2 F (36.2 C)   Ht 5' 9.5" (1.765 m)   Wt 223 lb (101.2 kg)   SpO2 96%   BMI 32.46 kg/m  Wt Readings from Last 3 Encounters:  07/22/22 223 lb (101.2 kg)  04/14/22 216 lb 6.4 oz (98.2 kg)  03/31/22 227 lb 8 oz (103.2 kg)   General Appearance: Well nourished, in no apparent distress. Eyes: PERRLA, EOMs, conjunctiva no swelling or erythema Sinuses: No Frontal/maxillary tenderness ENT/Mouth: Ext aud canals clear, TMs without erythema, bulging. No erythema, swelling, or exudate on post pharynx.  Tonsils not swollen or erythematous. Hearing normal.  Neck: Supple, thyroid normal.  Respiratory: Respiratory effort normal, BS equal bilaterally without rales, rhonchi, wheezing or stridor.  Cardio: RRR with no MRGs. Brisk peripheral pulses without edema.  Abdomen: Soft, + BS.  Non tender, no guarding, rebound, hernias, masses. Lymphatics: Non tender without lymphadenopathy.  Musculoskeletal: Full ROM, 5/5 strength, Normal gait Skin: Warm, dry without rashes, lesions, ecchymosis.  Neuro: Cranial nerves intact. No cerebellar symptoms.  Psych: Awake and oriented X 3, normal affect, Insight and Judgment appropriate.    Darrol Jump, NP 12:14 PM [facility]

## 2022-07-23 LAB — CBC WITH DIFFERENTIAL/PLATELET
Absolute Monocytes: 626 cells/uL (ref 200–950)
Basophils Absolute: 58 cells/uL (ref 0–200)
Basophils Relative: 0.5 %
Eosinophils Absolute: 313 cells/uL (ref 15–500)
Eosinophils Relative: 2.7 %
HCT: 42.3 % (ref 38.5–50.0)
Hemoglobin: 14.8 g/dL (ref 13.2–17.1)
Lymphs Abs: 2865 cells/uL (ref 850–3900)
MCH: 31.1 pg (ref 27.0–33.0)
MCHC: 35 g/dL (ref 32.0–36.0)
MCV: 88.9 fL (ref 80.0–100.0)
MPV: 10.3 fL (ref 7.5–12.5)
Monocytes Relative: 5.4 %
Neutro Abs: 7737 cells/uL (ref 1500–7800)
Neutrophils Relative %: 66.7 %
Platelets: 171 10*3/uL (ref 140–400)
RBC: 4.76 10*6/uL (ref 4.20–5.80)
RDW: 12.2 % (ref 11.0–15.0)
Total Lymphocyte: 24.7 %
WBC: 11.6 10*3/uL — ABNORMAL HIGH (ref 3.8–10.8)

## 2022-07-23 LAB — COMPLETE METABOLIC PANEL WITH GFR
AG Ratio: 1.7 (calc) (ref 1.0–2.5)
ALT: 13 U/L (ref 9–46)
AST: 16 U/L (ref 10–35)
Albumin: 4.3 g/dL (ref 3.6–5.1)
Alkaline phosphatase (APISO): 96 U/L (ref 35–144)
BUN: 13 mg/dL (ref 7–25)
CO2: 27 mmol/L (ref 20–32)
Calcium: 9.6 mg/dL (ref 8.6–10.3)
Chloride: 104 mmol/L (ref 98–110)
Creat: 0.95 mg/dL (ref 0.70–1.35)
Globulin: 2.5 g/dL (calc) (ref 1.9–3.7)
Glucose, Bld: 72 mg/dL (ref 65–99)
Potassium: 4.3 mmol/L (ref 3.5–5.3)
Sodium: 138 mmol/L (ref 135–146)
Total Bilirubin: 0.4 mg/dL (ref 0.2–1.2)
Total Protein: 6.8 g/dL (ref 6.1–8.1)
eGFR: 90 mL/min/{1.73_m2} (ref >=60)

## 2022-08-14 ENCOUNTER — Other Ambulatory Visit: Payer: Self-pay | Admitting: Nurse Practitioner

## 2022-08-14 DIAGNOSIS — F5104 Psychophysiologic insomnia: Secondary | ICD-10-CM

## 2022-08-27 ENCOUNTER — Other Ambulatory Visit: Payer: Self-pay | Admitting: Nurse Practitioner

## 2022-08-27 DIAGNOSIS — M2550 Pain in unspecified joint: Secondary | ICD-10-CM

## 2022-09-10 ENCOUNTER — Other Ambulatory Visit: Payer: Self-pay | Admitting: Neurology

## 2022-09-10 ENCOUNTER — Other Ambulatory Visit: Payer: Self-pay | Admitting: Nurse Practitioner

## 2022-09-16 ENCOUNTER — Telehealth: Payer: Self-pay | Admitting: Nurse Practitioner

## 2022-09-16 DIAGNOSIS — E782 Mixed hyperlipidemia: Secondary | ICD-10-CM

## 2022-09-16 MED ORDER — ROSUVASTATIN CALCIUM 40 MG PO TABS: ORAL_TABLET | ORAL | 1 refills | Status: DC

## 2022-09-16 NOTE — Telephone Encounter (Signed)
Patient is requesitng a refill on Rosuvastatin to [facility], [facility].  Also states that since he has been taking the Buspirone and the Hydroxyzine, he has had really bad headaches so he had to stop taking them. He also states that the hydroxyzine did not help him sleep.

## 2022-09-16 NOTE — Addendum Note (Signed)
Addended by: Chancy Hurter on: 09/16/2022 03:05 PM   Modules accepted: Orders

## 2022-09-29 ENCOUNTER — Encounter: Payer: Self-pay | Admitting: Neurology

## 2022-09-29 ENCOUNTER — Ambulatory Visit (INDEPENDENT_AMBULATORY_CARE_PROVIDER_SITE_OTHER): Payer: Commercial Managed Care - HMO | Admitting: Neurology

## 2022-09-29 VITALS — BP 125/76 | HR 53 | Ht 70.0 in | Wt 226.0 lb

## 2022-09-29 DIAGNOSIS — J449 Chronic obstructive pulmonary disease, unspecified: Secondary | ICD-10-CM | POA: Diagnosis not present

## 2022-09-29 DIAGNOSIS — F5104 Psychophysiologic insomnia: Secondary | ICD-10-CM

## 2022-09-29 DIAGNOSIS — G4733 Obstructive sleep apnea (adult) (pediatric): Secondary | ICD-10-CM

## 2022-09-29 DIAGNOSIS — F422 Mixed obsessional thoughts and acts: Secondary | ICD-10-CM

## 2022-09-29 NOTE — Progress Notes (Signed)
Provider:  Larey Seat, MD  Primary Care Physician:  Unk Pinto, MD [address] [place] [address]     Referring Provider:  Sarina Ill, MD          Chief Complaint according to patient   Patient presents with:     New Patient (Initial Visit)           HISTORY OF PRESENT ILLNESS:  [name] is a 62 y.o. male patient who is here for revisit 09/29/2022 for  chronic insomnia and  poor CPAP compliance.  09-29-2022 : CD Chief concern according to patient :   Chronic Insomnia- patient was not seen at counseling as he did not understand he has to make the appointment?   The patient was last seen after repeat sleep study by Debbora Presto, NP : He was referred to counseling and yet reports he has never seen them.  He reports he was not given any appointment.  The sleep study in July showed that his sleep apnea was well-controlled on the CPAP but his sleep remained very fragmented.  And he reports ongoing concerns of not getting enough sleep.  In the past he had tried and failed many sleep aids.  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  He continues to smoke but only 1 to 2 cigarettes daily.  He endorsed a high degree of depression on the geriatric depression scale 10 out of 15 points.  This definitely warrants counseling and depression treatment per se depression and anxiety are the main causes of insomnia aside from organic sleep disorders which she has been treated for.  His Epworth Sleepiness Scale was today endorsed at 9 out of 24 points which is in normal range he did  answer the questions of the fatigue severity scale at 60/ 63 - He never feels happy, never feels rested.  He lost employment because he couldn't get the strength and motivation to do it.   I do have a download for his [place] which he was set up with probably in 2022.  He has used the machine 30% of all days over 4 hours but overall  compliance for days was 25 out of 30 days the equivalent of 83%.  So his problem is not to put the CPAP on his problem is that he could not get 4 hours of sleep on CPAP.  His average pressure is 7.4 cm water, the machine is set between 7 and 14 cm water pressure with an initial starting point at 4 from where the pressure ramps up.  The residual AHI is 1.7/h which is very good so I consider his apnea well treated on CPAP could he continue to use it.  He reports RLS- he depends on xanax for treatment.    He continues to CHEW tobacco.       06/18/21 ALL:  [name] is a 62 y.o. male here today for follow up for OSA on CPAP and insomnia.  HST 03/2021 showed mild OSA with AHI 16.5/hr and not REM dependant. CPAP ordered.  He reports that he is doing well on CPAP therapy. He does feel that therapy helps him sleep deeper and he wakes feeling more refreshed. He continues to have concerns of insomnia. He was previously on quetiapine and reports that Dr Brett Fairy refilled this at previous visit 01/2021. He states that quetiapine causes hallucinations. He is requesting we refill alprazolam.  He states that he has taken at least 25 different sleep medicaitons that either do not work or cause some sort of side effect. He states that he was previously taking alprazolam every night that did seem to help. Per patient report, he was told by PCP that he could no longer write alprazolam due to not having a license to prescribe benzodiazepines. He states that he does not have a consistent schedule. 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 He can not tolerate medications that make him feel sedated in the mornings. He usually goes to bed around 11-12 and may wake anytime between 3-5. He feels that his mind never shuts down. He also endorses generalized pain that keeps him awake. He reports pain is not managed by any particular provider.       HISTORY: (copied from Dr Markan Cazarez's previous note)   [name]  is a 63 y.o. year old White or Caucasian male patient seen here as a referral on 01/29/2021 from Dr Orson Gear office.  Chief concern according to patient :   Presents today for ongoing issues for not getting sleep. He states for as long as can remember he is used to on avg getting 2.5 hrs out of 24 hr period. He works 18 hr days but during the day finds him self dozing off at times but never falling asleep. Last SS prior to 2016.  He was set up with a CPAP5/05/2015. He has not used the machine for several years. DME [employer].    SLEEP STUDY 02-24-2022: DIAGNOSIS Obstructive Sleep Apnea was well controlled under 10 cm water CPAP with 3 cm EPR, a FFM was used in medium size. The patient slept but sleep was highly fragmented.  No Central Sleep Apnea was seen.  No Sleep Related Hypoxemia was noted.  Sleep arousals due to discomfort, stiffness.  PLANS/RECOMMENDATIONS: CPAP therapy compliance is defined as 4 hours of nightly use. The current autotitration device will be reset for 7 through 14 cm water pressure, 3 cm EPR and the patient will receive a medium size ResMed FFM F 20 .    Review of Systems: Out of a complete 14 system review, the patient complains of only the following symptoms, and all other reviewed systems are negative.:  Fatigue, sleepiness , snoring, fragmented sleep, Insomnia- on CPAP but not fully complaint,    He has dyslexia- learning disability   How likely are you to doze in the following situations: 0 = not likely, 1 = slight chance, 2 = moderate chance, 3 = high chance   Sitting and Reading? Watching Television? Sitting inactive in a public place (theater or meeting)? As a passenger in a car for an hour without a break? Lying down in the afternoon when circumstances permit? Sitting and talking to someone? Sitting quietly after lunch without alcohol? In a car, while stopped for a few minutes in traffic?    His Epworth Sleepiness Scale was today endorsed at 9 out of  24 points which is in normal range he did not answer the questions of the fatigue severity scale Total = 9/ 24 points   FSS endorsed at 60/ 63 points.   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  Tobacco Use   Smoking status: Former    Packs/day: 1.00    Years: 8.00    Total pack years: 8.00    Types: Cigarettes    Quit date: 01/03/2015    Years since quitting: 7.7   Smokeless tobacco: Current    Types: Chew   Tobacco comments:    smokes 1-2 cigarettes daily  Substance and Sexual Activity   Alcohol use: Yes    Alcohol/week: 0.0 standard drinks of alcohol    Comment: drank 12 beers last night on 08/07/11, pt states rarely drinks   Drug use: No    Comment: occasionally chews tobacco   Sexual activity: Yes  Other Topics Concern   Not on file  Social History Narrative   Lives alone   Caffeine use: Coffee daily   Decaf tea   Social Determinants of Health   Financial Resource Strain: Not on file  Food Insecurity: Not on file  Transportation Needs: Not on file  Physical Activity: Not on file  Stress: Not on file  Social Connections: Not on file    Family History  Problem Relation Age of Onset   Uterine cancer Mother    Uterine cancer Sister    Stroke Neg Hx     Past Medical History:  Diagnosis Date   Acid reflux    Arthritis    Blood dyscrasia    ITP   Bruise 08/08/11   pt states brusing x 1 month   Chest pain 08/08/2011   "a little in last month"   Disorder of both ears 08/08/2011   blood in ears in past month - put on steroids, "tubes stopped up"   Fracture of left clavicle 02/08/2015   Hypercholesteremia    Hypertension, Labile 11/03/2013   no meds   Idiopathic thrombocytopenic purpura (ITP) (HCC)    Leg cramps 08/08/2011   bad in  last 1.5 months   Morbid obesity ([place])    Prediabetes    Sleep apnea    wears CPAP "sometimes"   Tick bites 08/08/11   60 to 70 ticks in last hunting season , last > 1 month    Past Surgical History:  Procedure Laterality Date   BACK SURGERY  07/2010   lumbar - 3 discs   FRACTURE SURGERY     right foot fracture, no surgery   LUMBAR DISC SURGERY  2011   ORIF CLAVICULAR FRACTURE Left 02/08/2015   Procedure: OPEN REDUCTION INTERNAL FIXATION (ORIF) LEFT CLAVICULAR FRACTURE;  Surgeon: Marchia Bond, MD;  Location: [facility];  Service: Orthopedics;  Laterality: Left;     Current Outpatient Medications on File Prior to Visit  Medication Sig Dispense Refill   ASPIRIN 81 PO Take by mouth daily.     Cholecalciferol (VITAMIN D PO) Take 10,000 Int'l Units by mouth daily.      diclofenac Sodium (VOLTAREN) 1 % GEL Apply 4 g topically 4 (four) times daily. 4 g 1   econazole nitrate 1 % cream Apply topically daily. 15 g 0   Flaxseed, Linseed, (FLAXSEED OIL) 1200 MG CAPS Take 1 capsule by mouth daily.      fluticasone (FLONASE) 50 MCG/ACT nasal spray SPRAY 1 TO 2 SPRAYS IN EACH NOSTRIL 1 TO 2 TIMES DAILY 16 mL 11   gabapentin (NEURONTIN) 300 MG capsule TAKE 1 CAPSULE 3 TIMES DAILY AS NEEDED FOR NEUROPATHY PAINS 270 capsule 3   Magnesium 500 MG TABS Take by mouth daily.     meloxicam (MOBIC) 7.5 MG  tablet TAKE 1 TABLET BY MOUTH EVERY DAY 30 tablet 2   Multiple Vitamins-Minerals (ZINC PO) Take 25.5 mg by mouth daily.     Omega-3 Fatty Acids (FISH OIL) 1200 MG CAPS Take 1 capsule by mouth daily.      rosuvastatin (CRESTOR) 40 MG tablet TAKE 1 TABLET BY MOUTH EVERY DAY FOR CHOLESTEROL (Patient taking differently: Take 20 mg by mouth daily. TAKE 1 TABLET BY MOUTH EVERY DAY FOR CHOLESTEROL) 90 tablet 1   No current facility-administered medications on file prior to visit.    Allergies  Allergen Reactions   Niacin And Related Swelling    Red rash and swelling   Gemfibrozil     Penicillins Other (See Comments)    Unknown childhood allergy Received 2 Gms Ancef with no obvious reaction     DIAGNOSTIC DATA (LABS, IMAGING, TESTING) - I reviewed patient records, labs, notes, testing and imaging myself where available.  Lab Results  Component Value Date   WBC 11.6 (H) 07/22/2022   HGB 14.8 07/22/2022   HCT 42.3 07/22/2022   MCV 88.9 07/22/2022   PLT 171 07/22/2022      Component Value Date/Time   NA 138 07/22/2022 1215   K 4.3 07/22/2022 1215   CL 104 07/22/2022 1215   CO2 27 07/22/2022 1215   GLUCOSE 72 07/22/2022 1215   BUN 13 07/22/2022 1215   CREATININE 0.95 07/22/2022 1215   CALCIUM 9.6 07/22/2022 1215   PROT 6.8 07/22/2022 1215   ALBUMIN 4.3 08/07/2016 1413   AST 16 07/22/2022 1215   ALT 13 07/22/2022 1215   ALKPHOS 86 08/07/2016 1413   BILITOT 0.4 07/22/2022 1215   GFRNONAA 82 12/06/2020 1409   GFRAA 96 12/06/2020 1409   Lab Results  Component Value Date   CHOL 123 04/14/2022   HDL 25 (L) 04/14/2022   LDLCALC 66 04/14/2022   TRIG 300 (H) 04/14/2022   CHOLHDL 4.9 04/14/2022   Lab Results  Component Value Date   HGBA1C 5.4 04/14/2022   Lab Results  Component Value Date   VITAMINB12 1,872 (H) 04/14/2022   Lab Results  Component Value Date   TSH 2.79 04/14/2022    PHYSICAL EXAM:  Today's Vitals   09/29/22 1047  BP: 125/76  Pulse: (!) 53  Weight: 226 lb (102.5 kg)  Height: 5' 10"$  (1.778 m)   Body mass index is 32.43 kg/m.   Wt Readings from Last 3 Encounters:  09/29/22 226 lb (102.5 kg)  07/22/22 223 lb (101.2 kg)  04/14/22 216 lb 6.4 oz (98.2 kg)     Ht Readings from Last 3 Encounters:  09/29/22 5' 10"$  (1.778 m)  07/22/22 5' 9.5" (1.765 m)  04/14/22 5' 9.5" (1.765 m)      General: The patient is awake, alert and appears not in acute distress.  Head: Normocephalic, atraumatic. Neck is supple. Mallampati 3,  neck circumference:16 inches . Nasal airflow restricted  patency-  sinus and ear pressure.  Retrognathia  is not seen.  Dental status: bio Cardiovascular:  Regular rate and cardiac rhythm by pulse,  without distended neck veins. Respiratory: deferred.  Skin:  Without evidence of ankle edema Trunk: The patient's posture is erect.   NEUROLOGIC EXAM: The patient is awake and alert, oriented to place and time.   Memory subjective described as intact.  Attention span & concentration ability appears normal.  Speech is fluent,  without  dysarthria, dysphonia or aphasia.  Mood and affect are appropriate.   Cranial nerves: no loss  of smell or taste reported  Pupils are equal and briskly reactive to light. Funduscopic exam deferred. .  Extraocular movements in vertical and horizontal planes were intact and without nystagmus. No Diplopia. Visual fields by finger perimetry are intact. Hearing was intact to soft voice and finger rubbing.    Facial sensation intact to fine touch.  Facial motor strength is symmetric and tongue and uvula move midline.  Neck ROM : rotation, tilt and flexion extension were normal for age and shoulder shrug was symmetrical.    Motor exam:  Symmetric bulk, tone and left shoulder restrcicted ROM.   Normal tone without cog-wheeling, symmetric grip strength .   Sensory:  reports pain in both feet. Fine touch, pinprick  normal.  Loss of vibration on the right foot ankle.  Proprioception tested in the upper extremities was normal.   Coordination: Rapid alternating movements in the fingers/hands were of normal speed.  The Finger-to-nose maneuver was intact without evidence of ataxia, dysmetria or tremor.   Gait and station: Patient could rise unassisted from a seated position, walked without assistive device.  Stance is of normal width/ base and the patient turned with 3 steps.  Toe and heel walk were deferred.  Deep tendon reflexes: in the  upper and lower extremities are symmetric and intact.  Babinski response was deferred .    ASSESSMENT AND PLAN 62 y.o. year old male  INSOMNIAC  here with:    1) chronic insomnia, he has a history of multiple joint disease, chronic pain which often also can trigger arousals at night.  He has been worried often his mind is racing at night.  He does have sleep apnea we have confirmed this twice and sleep studies and his current CPAP settings were treated but he does not reach the 4-hour mark of daily use of CPAP to be considered compliant with CPAP therapy.  When he is on CPAP his apnea is very well-controlled and there is no reason to change the settings.  I strongly suspect that there is a psychological reason for him to not be able to relax and go to sleep , stay asleep aside from orthopedic injuries related pain.   Chronic insomnia needs to be treated by cognitive behavioral therapy and we may have to make sure that his therapist will also consider PTSD in the differential.  I plan to follow up either personally or through our NP every 12 months for CPAP compliance only- we do not treat CHRONIC INSOMNIA>   I would like to thank Unk Pinto, MD and Amy Lomax, Sarina Ill, MD. for allowing me to meet with and to take care of this pleasant patient.  After spending a total time of  30  minutes face to face and additional time for physical and neurologic examination, review of laboratory studies,  personal review of imaging studies, reports and results of other testing and review of referral information / records as far as provided in visit,   Electronically signed by: Larey Seat, MD 09/29/2022 11:18 AM  [facility] certified by The [employer] of Sleep Medicine and Diplomate of the [employer] of Sleep Medicine. Board certified In Neurology through the [place], Fellow of the [employer] of Neurology. Medical Director of [employer].

## 2022-09-29 NOTE — Patient Instructions (Signed)
Insomnia Insomnia is a sleep disorder that makes it difficult to fall asleep or stay asleep. Insomnia can cause fatigue, low energy, difficulty concentrating, mood swings, and poor performance at work or school. There are three different ways to classify insomnia: Difficulty falling asleep. Difficulty staying asleep. Waking up too early in the morning. Any type of insomnia can be long-term (chronic) or short-term (acute). Both are common. Short-term insomnia usually lasts for 3 months or less. Chronic insomnia occurs at least three times a week for longer than 3 months. What are the causes? Insomnia may be caused by another condition, situation, or substance, such as: Having certain mental health conditions, such as anxiety and depression. Using caffeine, alcohol, tobacco, or drugs. Having gastrointestinal conditions, such as gastroesophageal reflux disease (GERD). Having certain medical conditions. These include: Asthma. Alzheimer's disease. Stroke. Chronic pain. An overactive thyroid gland (hyperthyroidism). Other sleep disorders, such as restless legs syndrome and sleep apnea. Menopause. Sometimes, the cause of insomnia may not be known. What increases the risk? Risk factors for insomnia include: Gender. Females are affected more often than males. Age. Insomnia is more common as people get older. Stress and certain medical and mental health conditions. Lack of exercise. Having an irregular work schedule. This may include working night shifts and traveling between different time zones. What are the signs or symptoms? If you have insomnia, the main symptom is having trouble falling asleep or having trouble staying asleep. This may lead to other symptoms, such as: Feeling tired or having low energy. Feeling nervous about going to sleep. Not feeling rested in the morning. Having trouble concentrating. Feeling irritable, anxious, or depressed. How is this diagnosed? This condition  may be diagnosed based on: Your symptoms and medical history. Your health care provider may ask about: Your sleep habits. Any medical conditions you have. Your mental health. A physical exam. How is this treated? Treatment for insomnia depends on the cause. Treatment may focus on treating an underlying condition that is causing the insomnia. Treatment may also include: Medicines to help you sleep. Counseling or therapy. Lifestyle adjustments to help you sleep better. Follow these instructions at home: Eating and drinking  Limit or avoid alcohol, caffeinated beverages, and products that contain nicotine and tobacco, especially close to bedtime. These can disrupt your sleep. Do not eat a large meal or eat spicy foods right before bedtime. This can lead to digestive discomfort that can make it hard for you to sleep. Sleep habits  Keep a sleep diary to help you and your health care provider figure out what could be causing your insomnia. Write down: When you sleep. When you wake up during the night. How well you sleep and how rested you feel the next day. Any side effects of medicines you are taking. What you eat and drink. Make your bedroom a dark, comfortable place where it is easy to fall asleep. Put up shades or blackout curtains to block light from outside. Use a white noise machine to block noise. Keep the temperature cool. Limit screen use before bedtime. This includes: Not watching TV. Not using your smartphone, tablet, or computer. Stick to a routine that includes going to bed and waking up at the same times every day and night. This can help you fall asleep faster. Consider making a quiet activity, such as reading, part of your nighttime routine. Try to avoid taking naps during the day so that you sleep better at night. Get out of bed if you are still awake after   15 minutes of trying to sleep. Keep the lights down, but try reading or doing a quiet activity. When you feel  sleepy, go back to bed. General instructions Take over-the-counter and prescription medicines only as told by your health care provider. Exercise regularly as told by your health care provider. However, avoid exercising in the hours right before bedtime. Use relaxation techniques to manage stress. Ask your health care provider to suggest some techniques that may work well for you. These may include: Breathing exercises. Routines to release muscle tension. Visualizing peaceful scenes. Make sure that you drive carefully. Do not drive if you feel very sleepy. Keep all follow-up visits. This is important. Contact a health care provider if: You are tired throughout the day. You have trouble in your daily routine due to sleepiness. You continue to have sleep problems, or your sleep problems get worse. Get help right away if: You have thoughts about hurting yourself or someone else. Get help right away if you feel like you may hurt yourself or others, or have thoughts about taking your own life. Go to your nearest emergency room or: Call 911. Call the National Suicide Prevention Lifeline at 1-800-273-8255 or 988. This is open 24 hours a day. Text the Crisis Text Line at 741741. Summary Insomnia is a sleep disorder that makes it difficult to fall asleep or stay asleep. Insomnia can be long-term (chronic) or short-term (acute). Treatment for insomnia depends on the cause. Treatment may focus on treating an underlying condition that is causing the insomnia. Keep a sleep diary to help you and your health care provider figure out what could be causing your insomnia. This information is not intended to replace advice given to you by your health care provider. Make sure you discuss any questions you have with your health care provider. Document Revised: 07/15/2021 Document Reviewed: 07/15/2021 Elsevier Patient Education  2023 [employer]  

## 2022-10-21 ENCOUNTER — Encounter: Payer: Self-pay | Admitting: Internal Medicine

## 2022-10-21 NOTE — Patient Instructions (Signed)

## 2022-10-21 NOTE — Progress Notes (Unsigned)
Future Appointments  Date Time Provider Department  10/22/2022  9:30 AM Unk Pinto, MD [facility]  11/05/2022 10:00 AM Donnetta Hutching, LMFT [facility]  04/17/2023 10:00 AM Unk Pinto, MD [facility]  09/28/2023 11:00 AM Debbora Presto, NP [facility]    History of Present Illness:       This very nice 62 y.o. [name] presents for 6 month follow up with HTN, HLD, Pre-Diabetes and Vitamin D Deficiency.  Patient is followed by Dr Brett Fairy  for OSA & CPAP .          Patient is monitored expectantly  for labile HTN  since 2012 & BP has been controlled at home. Today's BP is at goal - 136/80. Patient has had no complaints of any cardiac type chest pain, palpitations, dyspnea / orthopnea / PND, dizziness, claudication, or dependent edema.        Hyperlipidemia is controlled with diet & Rosuvastatin . Patient denies myalgias or other med SE's. Last Lipids were at goal except elevated Trig's :  Lab Results  Component Value Date   CHOL 123 04/14/2022   HDL 25 (L) 04/14/2022   LDLCALC 66 04/14/2022   TRIG 300 (H) 04/14/2022   CHOLHDL 4.9 04/14/2022     Also, the patient has history of PreDiabetes (A1c 5.9% /2012)    and has had no symptoms of reactive hypoglycemia, diabetic polys, paresthesias or visual blurring.  Last A1c was at goal :  Lab Results  Component Value Date   HGBA1C 5.4 04/14/2022                                                           Further, the patient also has history of Vitamin D Deficiency (A1c 5.9% /2012)  and supplements vitamin D . Last vitamin D was at goal :  Lab Results  Component Value Date   VD25OH 68 04/14/2022     Current Outpatient Medications on File Prior to Visit  Medication Sig   ASPIRIN 81 PO Take daily.   VITAMIN D  10,000 Units  Take daily.    diclofenac  1 % GEL Apply 4 g topically 4  times daily.   econazole nitrate 1 % cream Apply topically daily.   FLAXSEED OIL 1200 MG CAPS Take 1 capsule  daily.    FLONASE  nasal spray  1  TO 2 SPRAYS  1 TO 2 TIMES DAILY   gabapentin  300 MG capsule TAKE 1 CAPSULE 3 TIMES DAILY AS NEEDED    Magnesium 500 MG TABS Take  daily.   meloxicam (MOBIC) 7.5 MG tablet TAKE 1 TABLET  EVERY DAY   Multiple Vitamins-Minerals (ZINC PO) Take 25 mg daily.   Omega-3 FISH OIL 1200 MG CAPS Take 1 capsule  daily.    rosuvastatin  40 MG tablet Take 20 mg daily.      Allergies  Allergen Reactions   Niacin And Related Swelling    Red rash and swelling   Gemfibrozil    Penicillins Other (See Comments)    Unknown childhood allergy Received 2 Gms Ancef with no obvious reaction     PMHx:   Past Medical History:  Diagnosis Date   Acid reflux    Arthritis    Blood dyscrasia    ITP   Bruise 08/08/11  pt states brusing x 1 month   Chest pain 08/08/2011   "a little in last month"   Disorder of both ears 08/08/2011   blood in ears in past month - put on steroids, "tubes stopped up"   Fracture of left clavicle 02/08/2015   Hypercholesteremia    Hypertension, Labile 11/03/2013   no meds   Idiopathic thrombocytopenic purpura (ITP) (HCC)    Leg cramps 08/08/2011   bad in last 1.5 months   Morbid obesity ([place])    Prediabetes    Sleep apnea    wears CPAP "sometimes"   Tick bites 08/08/11   60 to 70 ticks in last hunting season , last > 1 month     Immunization History  Administered Date(s) Administered   Influenza Inj Mdck Quad  05/19/2018   Influenza Split 05/24/2013, 06/22/2014, 04/18/2017   Influenza, Seasonal,  07/03/2015   Influenza,inj,Quad  07/06/2017, 05/10/2019, 06/04/2021   Influenza,inj,quad 08/07/2016   Influenza 05/19/2018, 06/25/2020   PFIZER SARS-COV-2 Vacc 11/01/2019, 11/22/2019, 06/25/2020   PPD Test 09/09/2017, 04/15/2021, 04/14/2022   Pneumococcal -23 05/19/2012   Td 08/18/2008, 02/04/2015     Past Surgical History:  Procedure Laterality Date   BACK SURGERY  07/2010   lumbar - 3 discs   FRACTURE SURGERY     right foot fracture, no surgery   LUMBAR DISC  SURGERY  2011   ORIF CLAVICULAR FRACTURE Left 02/08/2015   Procedure: OPEN REDUCTION INTERNAL FIXATION (ORIF) LEFT CLAVICULAR FRACTURE;  Surgeon: Marchia Bond, MD;  Location: [facility];  Service: Orthopedics;  Laterality: Left;     FHx:    Reviewed / unchanged   SHx:    Reviewed / unchanged    Systems Review:  Constitutional: Denies fever, chills, wt changes, headaches, insomnia, fatigue, night sweats, change in appetite. Eyes: Denies redness, blurred vision, diplopia, discharge, itchy, watery eyes.  ENT: Denies discharge, congestion, post nasal drip, epistaxis, sore throat, earache, hearing loss, dental pain, tinnitus, vertigo, sinus pain, snoring.  CV: Denies chest pain, palpitations, irregular heartbeat, syncope, dyspnea, diaphoresis, orthopnea, PND, claudication or edema. Respiratory: denies cough, dyspnea, DOE, pleurisy, hoarseness, laryngitis, wheezing.  Gastrointestinal: Denies dysphagia, odynophagia, heartburn, reflux, water brash, abdominal pain or cramps, nausea, vomiting, bloating, diarrhea, constipation, hematemesis, melena, hematochezia  or hemorrhoids. Genitourinary: Denies dysuria, frequency, urgency, nocturia, hesitancy, discharge, hematuria or flank pain. Musculoskeletal: Denies arthralgias, myalgias, stiffness, jt. swelling, pain, limping or strain/sprain.  Skin: Denies pruritus, rash, hives, warts, acne, eczema or change in skin lesion(s). Neuro: No weakness, tremor, incoordination, spasms, paresthesia or pain. Psychiatric: Denies confusion, memory loss or sensory loss. Endo: Denies change in weight, skin or hair change.  Heme/Lymph: No excessive bleeding, bruising or enlarged lymph nodes.   Physical Exam  BP 136/80   Pulse 67   Temp 97.9 F (36.6 C)   Resp 16   Ht 5' 9.5" (1.765 m)   Wt 223 lb (101.2 kg)   SpO2 99%   BMI 32.46 kg/m   Appears  over nourished   and in no distress.  Eyes: PERRLA, EOMs, conjunctiva no swelling or  erythema. Sinuses: No frontal/maxillary tenderness ENT/Mouth: EAC's clear, TM's nl w/o erythema, bulging. Nares clear w/o erythema, swelling, exudates. Oropharynx clear without erythema or exudates. Oral hygiene is good. Tongue normal, non obstructing. Hearing intact.  Neck: Supple. Thyroid not palpable. Car 2+/2+ without bruits, nodes or JVD. Chest: Respirations nl with BS clear & equal w/o rales, rhonchi, wheezing or stridor.  Cor: Heart sounds normal w/ regular rate  and rhythm without sig. murmurs, gallops, clicks or rubs. Peripheral pulses normal and equal  without edema.  Abdomen: Soft & bowel sounds normal. Non-tender w/o guarding, rebound, hernias, masses or organomegaly.  Lymphatics: Unremarkable.  Musculoskeletal: Full ROM all peripheral extremities, joint stability, 5/5 strength and normal gait.  Skin: Warm, dry without exposed rashes, lesions or ecchymosis apparent.  Neuro: Cranial nerves intact, reflexes equal bilaterally. Sensory-motor testing grossly intact. Tendon reflexes grossly intact.  Pysch: Alert & oriented x 3.  Insight and judgement nl & appropriate. No ideations.   Assessment and Plan:  1. Labile hypertension  - Continue medication, monitor blood pressure at home.  - Continue DASH diet.  Reminder to go to the ER if any CP,  SOB, nausea, dizziness, severe HA, changes vision/speech.    - CBC with Differential/Platelet - COMPLETE METABOLIC PANEL WITH GFR - Magnesium - TSH  2. Hyperlipidemia, mixed  - Continue diet/meds, exercise,& lifestyle modifications.  - Continue monitor periodic cholesterol/liver & renal functions      - Lipid panel - TSH  3. Abnormal glucose  - Continue diet, exercise  - Lifestyle modifications.  - Monitor appropriate labs  - Continue supplementation    - Hemoglobin A1c - Insulin, random  4. Vitamin D deficiency  - VITAMIN D 25 Hydroxy 5. Medication management  - CBC with Differential/Platelet - COMPLETE METABOLIC PANEL  WITH GFR - Magnesium - Lipid panel - TSH - Hemoglobin A1c - Insulin, random - VITAMIN D 25 Hydroxy          Discussed  regular exercise, BP monitoring, weight control to achieve/maintain BMI less than 25 and discussed med and SE's. Recommended labs to assess /monitor clinical status .  I discussed the assessment and treatment plan with the patient. The patient was provided an opportunity to ask questions and all were answered. The patient agreed with the plan and demonstrated an understanding of the instructions.  I provided over 30 minutes of exam, counseling, chart review and  complex critical decision making.        The patient was advised to call back or seek an in-person evaluation if the symptoms worsen or if the condition fails to improve as anticipated.   Kirtland Bouchard, MD

## 2022-10-22 ENCOUNTER — Encounter: Payer: Self-pay | Admitting: Internal Medicine

## 2022-10-22 ENCOUNTER — Ambulatory Visit (INDEPENDENT_AMBULATORY_CARE_PROVIDER_SITE_OTHER): Payer: Commercial Managed Care - HMO | Admitting: Internal Medicine

## 2022-10-22 VITALS — BP 136/80 | HR 67 | Temp 97.9°F | Resp 16 | Ht 69.5 in | Wt 223.0 lb

## 2022-10-22 DIAGNOSIS — R7309 Other abnormal glucose: Secondary | ICD-10-CM | POA: Diagnosis not present

## 2022-10-22 DIAGNOSIS — R0989 Other specified symptoms and signs involving the circulatory and respiratory systems: Secondary | ICD-10-CM

## 2022-10-22 DIAGNOSIS — Z79899 Other long term (current) drug therapy: Secondary | ICD-10-CM

## 2022-10-22 DIAGNOSIS — E782 Mixed hyperlipidemia: Secondary | ICD-10-CM | POA: Diagnosis not present

## 2022-10-22 DIAGNOSIS — E559 Vitamin D deficiency, unspecified: Secondary | ICD-10-CM

## 2022-10-22 MED ORDER — DEXAMETHASONE 4 MG PO TABS: ORAL_TABLET | ORAL | 0 refills | Status: DC

## 2022-10-23 LAB — CBC WITH DIFFERENTIAL/PLATELET
Absolute Monocytes: 636 cells/uL (ref 200–950)
Basophils Absolute: 71 cells/uL (ref 0–200)
Basophils Relative: 0.7 %
Eosinophils Absolute: 232 cells/uL (ref 15–500)
Eosinophils Relative: 2.3 %
HCT: 47 % (ref 38.5–50.0)
Hemoglobin: 15.8 g/dL (ref 13.2–17.1)
Lymphs Abs: 2485 cells/uL (ref 850–3900)
MCH: 29.6 pg (ref 27.0–33.0)
MCHC: 33.6 g/dL (ref 32.0–36.0)
MCV: 88 fL (ref 80.0–100.0)
MPV: 10.2 fL (ref 7.5–12.5)
Monocytes Relative: 6.3 %
Neutro Abs: 6676 cells/uL (ref 1500–7800)
Neutrophils Relative %: 66.1 %
Platelets: 183 10*3/uL (ref 140–400)
RBC: 5.34 10*6/uL (ref 4.20–5.80)
RDW: 12.2 % (ref 11.0–15.0)
Total Lymphocyte: 24.6 %
WBC: 10.1 10*3/uL (ref 3.8–10.8)

## 2022-10-23 LAB — COMPLETE METABOLIC PANEL WITH GFR
AG Ratio: 1.7 (calc) (ref 1.0–2.5)
ALT: 20 U/L (ref 9–46)
AST: 21 U/L (ref 10–35)
Albumin: 4.7 g/dL (ref 3.6–5.1)
Alkaline phosphatase (APISO): 105 U/L (ref 35–144)
BUN: 11 mg/dL (ref 7–25)
CO2: 26 mmol/L (ref 20–32)
Calcium: 9.5 mg/dL (ref 8.6–10.3)
Chloride: 102 mmol/L (ref 98–110)
Creat: 0.9 mg/dL (ref 0.70–1.35)
Globulin: 2.8 g/dL (calc) (ref 1.9–3.7)
Glucose, Bld: 86 mg/dL (ref 65–99)
Potassium: 4.2 mmol/L (ref 3.5–5.3)
Sodium: 139 mmol/L (ref 135–146)
Total Bilirubin: 0.5 mg/dL (ref 0.2–1.2)
Total Protein: 7.5 g/dL (ref 6.1–8.1)
eGFR: 97 mL/min/{1.73_m2} (ref >=60)

## 2022-10-23 LAB — LIPID PANEL
Cholesterol: 130 mg/dL (ref <200)
HDL: 26 mg/dL — ABNORMAL LOW (ref >=40)
LDL Cholesterol (Calc): 72 mg/dL (calc)
Non-HDL Cholesterol (Calc): 104 mg/dL (calc) (ref <130)
Total CHOL/HDL Ratio: 5 (calc) — ABNORMAL HIGH (ref <5.0)
Triglycerides: 232 mg/dL — ABNORMAL HIGH (ref <150)

## 2022-10-23 LAB — HEMOGLOBIN A1C
Hgb A1c MFr Bld: 5.8 % of total Hgb — ABNORMAL HIGH (ref <5.7)
Mean Plasma Glucose: 120 mg/dL
eAG (mmol/L): 6.6 mmol/L

## 2022-10-23 LAB — VITAMIN D 25 HYDROXY (VIT D DEFICIENCY, FRACTURES): Vit D, 25-Hydroxy: 74 ng/mL (ref 30–100)

## 2022-10-23 LAB — MAGNESIUM: Magnesium: 2.3 mg/dL (ref 1.5–2.5)

## 2022-10-23 LAB — INSULIN, RANDOM: Insulin: 13.5 u[IU]/mL

## 2022-10-23 LAB — TSH: TSH: 1.99 mIU/L (ref 0.40–4.50)

## 2022-10-23 NOTE — Progress Notes (Signed)
<><><><><><><><><><><><><><><><><><><><><><><><><><><><><><><><><> <><><><><><><><><><><><><><><><><><><><><><><><><><><><><><><><><>  -   Chol = 130 & LDL Chol = 72   both  Excellent   - Very low risk for Heart Attack  / Stroke  - But Triglycerides (= 232 ) or fats in blood are too high                 (   Ideal or  Goal is less than 150  !  )    - Recommend avoid fried & greasy foods,  sweets / candy,   - Avoid white rice  (brown or wild rice or Quinoa is OK),   - Avoid white potatoes  (sweet potatoes are OK)   - Avoid anything made from white flour  - bagels, doughnuts, rolls, buns, biscuits, white and   wheat breads, pizza crust and traditional  pasta made of white flour & egg white  - (vegetarian pasta or spinach or wheat pasta is OK).    - Multi-grain bread is OK - like multi-grain flat bread or  sandwich thins.   - Avoid alcohol in excess.   - Exercise is also important.  <><><><><><><><><><><><><><><><><><><><><><><><><><><><><><><><><> <><><><><><><><><><><><><><><><><><><><><><><><><><><><><><><><><>  -  A1c = 5.8% is Newly elevated blood sugar and is  elevated in the borderline and                                                              early or pre-diabetes range which has the same   300% increased risk for heart attack, stroke, cancer and                                               alzheimer- type vascular dementia as full blown diabetes.   But the good news is that diet, exercise with weight loss can                                                                                 cure the early diabetes at this point.  So need to work on Diet & Weight loss !  <><><><><><><><><><><><><><><><><><><><><><><><><><><><><><><><><> <><><><><><><><><><><><><><><><><><><><><><><><><><><><><><><><><>  -  Vitamin D = 74 - Excellent  - Please keep dose same  <><><><><><><><><><><><><><><><><><><><><><><><><><><><><><><><><>  -  All Else - CBC - Kidneys -  Electrolytes - Liver - Magnesium & Thyroid    - all  Normal / OK  <><><><><><><><><><><><><><><><><><><><><><><><><><><><><><><><><> <><><><><><><><><><><><><><><><><><><><><><><><><><><><><><><><><>

## 2022-11-05 ENCOUNTER — Ambulatory Visit: Payer: Commercial Managed Care - HMO | Admitting: Behavioral Health

## 2023-01-23 ENCOUNTER — Other Ambulatory Visit: Payer: Self-pay | Admitting: Nurse Practitioner

## 2023-01-23 DIAGNOSIS — M2550 Pain in unspecified joint: Secondary | ICD-10-CM

## 2023-02-03 ENCOUNTER — Ambulatory Visit: Payer: Commercial Managed Care - HMO | Admitting: Nurse Practitioner

## 2023-02-03 ENCOUNTER — Encounter: Payer: Self-pay | Admitting: Nurse Practitioner

## 2023-02-03 VITALS — BP 112/82 | HR 65 | Temp 97.7°F | Ht 69.5 in | Wt 221.4 lb

## 2023-02-03 DIAGNOSIS — J3089 Other allergic rhinitis: Secondary | ICD-10-CM

## 2023-02-03 DIAGNOSIS — R7303 Prediabetes: Secondary | ICD-10-CM

## 2023-02-03 DIAGNOSIS — R0989 Other specified symptoms and signs involving the circulatory and respiratory systems: Secondary | ICD-10-CM

## 2023-02-03 DIAGNOSIS — Z79899 Other long term (current) drug therapy: Secondary | ICD-10-CM

## 2023-02-03 DIAGNOSIS — E669 Obesity, unspecified: Secondary | ICD-10-CM

## 2023-02-03 DIAGNOSIS — E782 Mixed hyperlipidemia: Secondary | ICD-10-CM

## 2023-02-03 DIAGNOSIS — F5104 Psychophysiologic insomnia: Secondary | ICD-10-CM

## 2023-02-03 DIAGNOSIS — Z8659 Personal history of other mental and behavioral disorders: Secondary | ICD-10-CM

## 2023-02-03 DIAGNOSIS — M25519 Pain in unspecified shoulder: Secondary | ICD-10-CM

## 2023-02-03 DIAGNOSIS — G4733 Obstructive sleep apnea (adult) (pediatric): Secondary | ICD-10-CM

## 2023-02-03 DIAGNOSIS — N200 Calculus of kidney: Secondary | ICD-10-CM

## 2023-02-03 DIAGNOSIS — K2100 Gastro-esophageal reflux disease with esophagitis, without bleeding: Secondary | ICD-10-CM

## 2023-02-03 MED ORDER — SERTRALINE HCL 25 MG PO TABS: 25.0000 mg | ORAL_TABLET | Freq: Every day | ORAL | 0 refills | Status: DC

## 2023-02-03 NOTE — Patient Instructions (Signed)
Sertraline Tablets What is this medication? SERTRALINE (SER tra leen) treats depression, anxiety, obsessive-compulsive disorder (OCD), post-traumatic stress disorder (PTSD), and premenstrual dysphoric disorder (PMDD). It increases the amount of serotonin in the brain, a hormone that helps regulate mood. It belongs to a group of medications called SSRIs. This medicine may be used for other purposes; ask your health care provider or pharmacist if you have questions. COMMON BRAND NAME(S): Zoloft What should I tell my care team before I take this medication? They need to know if you have any of these conditions: Bleeding disorders Bipolar disorder or a family history of bipolar disorder Frequently drink alcohol Glaucoma Heart disease High blood pressure History of irregular heartbeat History of low levels of calcium, magnesium, or potassium in the blood Liver disease Receiving electroconvulsive therapy Seizures Suicidal thoughts, plans, or attempt by you or a family member Take medications that prevent or treat blood clots Thyroid disease An unusual or allergic reaction to sertraline, other medications, foods, dyes, or preservatives Pregnant or trying to get pregnant Breastfeeding How should I use this medication? Take this medication by mouth with a glass of water. Take it as directed on the prescription label at the same time every day. You can take it with or without food. If it upsets your stomach, take it with food. Do not take your medication more often than directed. Keep taking this medication unless your care team tells you to stop. Stopping it too quickly can cause serious side effects. It can also make your condition worse. A special MedGuide will be given to you by the pharmacist with each prescription and refill. Be sure to read this information carefully each time. Talk to your care team about the use of this medication in children. While it may be prescribed for children as  young as 7 years for selected conditions, precautions do apply. Overdosage: If you think you have taken too much of this medicine contact a poison control center or emergency room at once. NOTE: This medicine is only for you. Do not share this medicine with others. What if I miss a dose? If you miss a dose, take it as soon as you can. If it is almost time for your next dose, take only that dose. Do not take double or extra doses. What may interact with this medication? Do not take this medication with any of the following: Cisapride Dronedarone Linezolid MAOIs, such as Carbex, Eldepryl, Marplan, Nardil, and Parnate Methylene blue (injected into a vein) Pimozide Thioridazine This medication may also interact with the following: Alcohol Amphetamines Aspirin and aspirin-like medications Certain medications for fungal infections, such as ketoconazole, fluconazole, posaconazole, itraconazole Certain medications for irregular heart beat, such as flecainide, quinidine, propafenone Certain medications for mental health conditions Certain medications for migraine headaches, such as almotriptan, eletriptan, frovatriptan, naratriptan, rizatriptan, sumatriptan, zolmitriptan Certain medications for seizures, such as carbamazepine, valproic acid, phenytoin Certain medications for sleep Certain medications that prevent or treat blood clots, such as warfarin, enoxaparin, dalteparin Cimetidine Digoxin Diuretics Fentanyl Isoniazid Lithium NSAIDs, medications for pain and inflammation, such as ibuprofen or naproxen Other medications that cause heart rhythm changes, such as dofetilide Rasagiline Safinamide Supplements, such as St. John's wort, kava kava, valerian Tolbutamide Tramadol Tryptophan This list may not describe all possible interactions. Give your health care provider a list of all the medicines, herbs, non-prescription drugs, or dietary supplements you use. Also tell them if you smoke,  drink alcohol, or use illegal drugs. Some items may interact with your medicine.   What should I watch for while using this medication? Tell your care team if your symptoms do not get better or if they get worse. Visit your care team for regular checks on your progress. Because it may take several weeks to see the full effects of this medication, it is important to continue your treatment as prescribed by your care team. Patients and their families should watch out for new or worsening thoughts of suicide or depression. Also watch out for sudden changes in feelings such as feeling anxious, agitated, panicky, irritable, hostile, aggressive, impulsive, severely restless, overly excited and hyperactive, or not being able to sleep. If this happens, especially at the beginning of treatment or after a change in dose, call your care team. This medication may affect your coordination, reaction time, or judgment. Do not drive or operate machinery until you know how this medication affects you. Sit or stand up slowly to reduce the risk of dizzy or fainting spells. Drinking alcohol with this medication can increase the risk of these side effects. Your mouth may get dry. Chewing sugarless gum or sucking hard candy, and drinking plenty of water may help. Contact your care team if the problem does not go away or is severe. What side effects may I notice from receiving this medication? Side effects that you should report to your care team as soon as possible: Allergic reactions--skin rash, itching, hives, swelling of the face, lips, tongue, or throat Bleeding--bloody or black, tar-like stools, red or dark brown urine, vomiting blood or brown material that looks like coffee grounds, small red or purple spots on skin, unusual bleeding or bruising Heart rhythm changes--fast or irregular heartbeat, dizziness, feeling faint or lightheaded, chest pain, trouble breathing Low sodium level--muscle weakness, fatigue, dizziness,  headache, confusion Serotonin syndrome--irritability, confusion, fast or irregular heartbeat, muscle stiffness, twitching muscles, sweating, high fever, seizure, chills, vomiting, diarrhea Sudden eye pain or change in vision such as blurred vision, seeing halos around lights, vision loss Thoughts of suicide or self-harm, worsening mood Side effects that usually do not require medical attention (report these to your care team if they continue or are bothersome): Change in sex drive or performance Diarrhea Excessive sweating Nausea Tremors or shaking Upset stomach This list may not describe all possible side effects. Call your doctor for medical advice about side effects. You may report side effects to FDA at 1-800-FDA-1088. Where should I keep my medication? Keep out of the reach of children and pets. Store at room temperature between 20 and 25 degrees C (68 and 77 degrees F). Get rid of any unused medication after the expiration date. To get rid of medications that are no longer needed or expired: Take the medication to a medication take-back program. Check with your pharmacy or law enforcement to find a location. If you cannot return the medication, check the label or package insert to see if the medication should be thrown out in the garbage or flushed down the toilet. If you are not sure, ask your care team. If it is safe to put in the trash, empty the medication out of the container. Mix the medication with cat litter, dirt, coffee grounds, or other unwanted substance. Seal the mixture in a bag or container. Put it in the trash. NOTE: This sheet is a summary. It may not cover all possible information. If you have questions about this medicine, talk to your doctor, pharmacist, or health care provider.  2024 Elsevier/Gold Standard (2022-03-04 00:00:00)  

## 2023-02-03 NOTE — Progress Notes (Signed)
FOLLOW UP  Assessment and Plan:   Hypertension Controlled via lifestyle  Discussed DASH (Dietary Approaches to Stop Hypertension) DASH diet is lower in sodium than a typical American diet. Cut back on foods that are high in saturated fat, cholesterol, and trans fats. Eat more whole-grain foods, fish, poultry, and nuts Remain active and exercise as tolerated daily.  Monitor BP at home-Call if greater than 130/80.  Check CMP/CBC  Cholesterol Continue Rosuvastatin Discussed lifestyle modifications. Recommended diet heavy in fruits and veggies, omega 3's. Decrease consumption of animal meats, cheeses, and dairy products. Remain active and exercise as tolerated. Continue to monitor. Check lipids/TSH  Obesity with co morbidities Discussed appropriate BMI Diet modification. Physical activity. Encouraged/praised to build confidence.  Prediabetes Education: Reviewed 'ABCs' of diabetes management  Discussed goals to be met and/or maintained include A1C (<7) Blood pressure (<130/80) Cholesterol (LDL <70) Continue Eye Exam yearly  Continue Dental Exam Q6 mo Discussed dietary recommendations Discussed Physical Activity recommendations Check A1C  OSA on CPAP Discussed importance of compliance Continue CPAP as directed Discussed weight management  GERD Continue medications PRN. No suspected reflux complications (Barret/stricture). Lifestyle modification:  wt loss, avoid meals 2-3h before bedtime. Consider eliminating food triggers:  chocolate, caffeine, EtOH, acid/spicy food.  Arthralgia of shoulder, unspecified laterality Improved and stable. Continue Meloxicam 7.5 mg daily PRN Continue Gabapentin 300 mg PRN Take with H2 blocker such as Pepcid/Prilosec RICE method when flared. Alternate Ice/Heat. Contact office if s/s worsen.  Nephrolithiasis No recent flare Continue to stay well hydrated.  Medication management All medications discussed and reviewed in full. All  questions and concerns regarding medications addressed.    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 Establish bed and wake times. Sleep restriction-only sleep estimated hrs sleep. Bed only for sex and sleep, only sleep when sleepy, out of bed if anxious (stimulus control). Reviewed relaxation techniques, mindful meditations.  Orders Placed This Encounter  Procedures   CBC with Differential/Platelet   COMPLETE METABOLIC PANEL WITH GFR   Lipid panel   Hemoglobin A1c   Meds ordered this encounter  Medications   sertraline (ZOLOFT) 25 MG tablet    Sig: Take 1 tablet (25 mg total) by mouth at bedtime.    Dispense:  30 tablet    Refill:  0    Order Specific Question:   Supervising Provider    Answer:   Lucky Cowboy [phone]   Notify office for further evaluation and treatment, questions or concerns if any reported s/s fail to improve.   The patient was advised to call back or seek an in-person evaluation if any symptoms worsen or if the condition fails to improve as anticipated.   Further disposition pending results of labs. Discussed med's effects and SE's.    I discussed the assessment and treatment plan with the patient. The patient was provided an opportunity to ask questions and all were answered. The patient agreed with the plan and demonstrated an understanding of the instructions.  Discussed med's effects and SE's. Screening labs and tests as requested with regular follow-up as recommended.  I provided 20 minutes of face-to-face time during this encounter including counseling, chart review, and critical decision making was preformed.   Future Appointments  Date Time Provider Department Center  05/14/2023 10:00 AM Lucky Cowboy, MD [facility] None  09/28/2023 11:00 AM Lomax, Amy, NP [facility] None     ----------------------------------------------------------------------------------------------------------------------  HPI 62 y.o.  male  presents for 3 month follow up on hypertension, cholesterol, diabetes, weight and vitamin D deficiency.   Overall he reports feeling well.    He is currently treating seasonal allergies with Flonase.  Thought he was having chronic sinus infections but found it to be r/t use of CPAP machine.  He reports having amild case of sleep apnea, does not feel as though the machine helps and causes worsening SE. Has stopped using.  Continues to have report 2-4 hours of sleep at night.  He denies waking himself from sleep.  Feels issue is r/t mind racing not being able to fall asleep.  At times he can get a "good nights rest."  Was on Xanax 1 mg in the past but has weaned off.  He has a hx of PTSD.  Has also tried Buspirone which he reports was not effective.  He takes Magnesium.    He also takes Gabapentin PRN and Meloxicam 7.5 mg PRN for shoulder pain.  Unable to take Gabapentin 300 mg TID d/t drowsiness and feeling as though he is moving slow.  He is not as productive during the day.  Gabapentin can help with sleep at times.   BMI is Body mass index is 32.23 kg/m., he has not been working on diet and exercise. Wt Readings from Last 3 Encounters:  02/03/23 221 lb 6.4 oz (100.4 kg)  10/22/22 223 lb (101.2 kg)  09/29/22 226 lb (102.5 kg)    His blood pressure has been controlled at home, today their BP is BP: 112/82  He does not workout. He denies chest pain, shortness of breath, dizziness.  States that he has cut out salt from his diet.  He no longer endorses swelling in the feet.    He is on cholesterol medication Rosuvastatin and denies myalgias. His cholesterol is at goal. The cholesterol last visit was:   Lab Results  Component Value Date   CHOL 130 10/22/2022   HDL 26 (L) 10/22/2022   LDLCALC 72 10/22/2022   TRIG 232 (H) 10/22/2022   CHOLHDL 5.0 (H)  10/22/2022    He has not been working on diet and exercise for prediabetes, and denies polydipsia and polyuria. Last A1C in the office was:  Lab Results  Component Value Date   HGBA1C 5.8 (H) 10/22/2022   Patient is on Vitamin D supplement.   Lab Results  Component Value Date   VD25OH 74 10/22/2022        Current Medications:  Current Outpatient Medications on File Prior to Visit  Medication Sig   ASPIRIN 81 PO Take by mouth daily.   Cholecalciferol (VITAMIN D PO) Take 10,000 Int'l Units by mouth daily.    dexamethasone (DECADRON) 4 MG tablet Take 1 tab 3 x day  as Directed for Pain & Inflammation   econazole nitrate 1 % cream Apply topically daily.   Flaxseed, Linseed, (FLAXSEED OIL) 1200 MG CAPS Take 1 capsule by mouth daily.    fluticasone (FLONASE) 50 MCG/ACT nasal spray SPRAY 1 TO 2 SPRAYS IN EACH NOSTRIL 1 TO 2 TIMES DAILY   gabapentin (NEURONTIN) 300 MG capsule TAKE 1 CAPSULE 3 TIMES DAILY AS NEEDED FOR NEUROPATHY PAINS   Magnesium 500 MG TABS Take by mouth daily.   meloxicam (MOBIC) 7.5 MG tablet TAKE 1 TABLET BY MOUTH EVERY DAY   Multiple Vitamins-Minerals (ZINC PO) Take 25.5 mg by mouth daily.   Omega-3 Fatty Acids (FISH OIL) 1200 MG CAPS Take 1 capsule by mouth daily.  rosuvastatin (CRESTOR) 40 MG tablet TAKE 1 TABLET BY MOUTH EVERY DAY FOR CHOLESTEROL (Patient taking differently: Take 20 mg by mouth daily. TAKE 1 TABLET BY MOUTH EVERY DAY FOR CHOLESTEROL)   diclofenac Sodium (VOLTAREN) 1 % GEL Apply 4 g topically 4 (four) times daily. (Patient not taking: Reported on 02/03/2023)   No current facility-administered medications on file prior to visit.     Allergies:  Allergies  Allergen Reactions   Niacin And Related Swelling    Red rash and swelling   Gemfibrozil    Penicillins Other (See Comments)    Unknown childhood allergy Received 2 Gms Ancef with no obvious reaction     Medical History:  Past Medical History:  Diagnosis Date   Acid reflux     Arthritis    Blood dyscrasia    ITP   Bruise 08/08/11   pt states brusing x 1 month   Chest pain 08/08/2011   "a little in last month"   Disorder of both ears 08/08/2011   blood in ears in past month - put on steroids, "tubes stopped up"   Fracture of left clavicle 02/08/2015   Hypercholesteremia    Hypertension, Labile 11/03/2013   no meds   Idiopathic thrombocytopenic purpura (ITP) (HCC)    Leg cramps 08/08/2011   bad in last 1.5 months   Morbid obesity (HCC)    Prediabetes    Sleep apnea    wears CPAP "sometimes"   Tick bites 08/08/11   60 to 70 ticks in last hunting season , last > 1 month   Family history- Reviewed and unchanged Social history- Reviewed and unchanged   Review of Systems:  ROS    Physical Exam: BP 112/82   Pulse 65   Temp 97.7 F (36.5 C)   Ht 5' 9.5" (1.765 m)   Wt 221 lb 6.4 oz (100.4 kg)   SpO2 99%   BMI 32.23 kg/m  Wt Readings from Last 3 Encounters:  02/03/23 221 lb 6.4 oz (100.4 kg)  10/22/22 223 lb (101.2 kg)  09/29/22 226 lb (102.5 kg)   General Appearance: Well nourished, in no apparent distress. Eyes: PERRLA, EOMs, conjunctiva no swelling or erythema Sinuses: No Frontal/maxillary tenderness ENT/Mouth: Ext aud canals clear, TMs without erythema, bulging. No erythema, swelling, or exudate on post pharynx.  Tonsils not swollen or erythematous. Hearing normal.  Neck: Supple, thyroid normal.  Respiratory: Respiratory effort normal, BS equal bilaterally without rales, rhonchi, wheezing or stridor.  Cardio: RRR with no MRGs. Brisk peripheral pulses without edema.  Abdomen: Soft, + BS.  Non tender, no guarding, rebound, hernias, masses. Lymphatics: Non tender without lymphadenopathy.  Musculoskeletal: Full ROM, 5/5 strength, Normal gait Skin: Warm, dry without rashes, lesions, ecchymosis.  Neuro: Cranial nerves intact. No cerebellar symptoms.  Psych: Awake and oriented X 3, normal affect, Insight and Judgment appropriate.    Adela Glimpse, NP 9:39 AM [facility]

## 2023-02-04 LAB — CBC WITH DIFFERENTIAL/PLATELET
Absolute Monocytes: 610 cells/uL (ref 200–950)
Basophils Absolute: 60 cells/uL (ref 0–200)
Basophils Relative: 0.6 %
Eosinophils Absolute: 250 cells/uL (ref 15–500)
Eosinophils Relative: 2.5 %
HCT: 42.9 % (ref 38.5–50.0)
Hemoglobin: 14.5 g/dL (ref 13.2–17.1)
Lymphs Abs: 2800 cells/uL (ref 850–3900)
MCH: 29.8 pg (ref 27.0–33.0)
MCHC: 33.8 g/dL (ref 32.0–36.0)
MCV: 88.3 fL (ref 80.0–100.0)
MPV: 10.3 fL (ref 7.5–12.5)
Monocytes Relative: 6.1 %
Neutro Abs: 6280 cells/uL (ref 1500–7800)
Neutrophils Relative %: 62.8 %
Platelets: 187 10*3/uL (ref 140–400)
RBC: 4.86 10*6/uL (ref 4.20–5.80)
RDW: 12.6 % (ref 11.0–15.0)
Total Lymphocyte: 28 %
WBC: 10 10*3/uL (ref 3.8–10.8)

## 2023-02-04 LAB — COMPLETE METABOLIC PANEL WITH GFR
AG Ratio: 1.8 (calc) (ref 1.0–2.5)
ALT: 16 U/L (ref 9–46)
AST: 14 U/L (ref 10–35)
Albumin: 4.3 g/dL (ref 3.6–5.1)
Alkaline phosphatase (APISO): 88 U/L (ref 35–144)
BUN: 14 mg/dL (ref 7–25)
CO2: 28 mmol/L (ref 20–32)
Calcium: 9.3 mg/dL (ref 8.6–10.3)
Chloride: 106 mmol/L (ref 98–110)
Creat: 1.14 mg/dL (ref 0.70–1.35)
Globulin: 2.4 g/dL (calc) (ref 1.9–3.7)
Glucose, Bld: 105 mg/dL — ABNORMAL HIGH (ref 65–99)
Potassium: 4.6 mmol/L (ref 3.5–5.3)
Sodium: 140 mmol/L (ref 135–146)
Total Bilirubin: 0.3 mg/dL (ref 0.2–1.2)
Total Protein: 6.7 g/dL (ref 6.1–8.1)
eGFR: 73 mL/min/{1.73_m2} (ref >=60)

## 2023-02-04 LAB — LIPID PANEL
Cholesterol: 94 mg/dL (ref <200)
HDL: 29 mg/dL — ABNORMAL LOW (ref >=40)
LDL Cholesterol (Calc): 42 mg/dL (calc)
Non-HDL Cholesterol (Calc): 65 mg/dL (calc) (ref <130)
Total CHOL/HDL Ratio: 3.2 (calc) (ref <5.0)
Triglycerides: 143 mg/dL (ref <150)

## 2023-02-04 LAB — HEMOGLOBIN A1C
Hgb A1c MFr Bld: 5.9 % of total Hgb — ABNORMAL HIGH (ref <5.7)
Mean Plasma Glucose: 123 mg/dL
eAG (mmol/L): 6.8 mmol/L

## 2023-02-13 ENCOUNTER — Telehealth: Payer: Self-pay | Admitting: Nurse Practitioner

## 2023-02-13 NOTE — Telephone Encounter (Signed)
Pt. Thinks they had a sinus infection or something. Symptoms are sinus congestion, scratchy throat, cough. Has been taking dexamethazone, but doesn't seem to be getting better. He was wondering if you could send him in a Zpac or something to the [facility] on file.

## 2023-02-24 ENCOUNTER — Encounter: Payer: Self-pay | Admitting: Nurse Practitioner

## 2023-02-24 ENCOUNTER — Ambulatory Visit: Payer: Commercial Managed Care - HMO | Admitting: Nurse Practitioner

## 2023-02-24 ENCOUNTER — Ambulatory Visit
Admission: RE | Admit: 2023-02-24 | Discharge: 2023-02-24 | Disposition: A | Discharge status: Home / Self Care | Payer: Commercial Managed Care - HMO | Source: Ambulatory Visit | Attending: Nurse Practitioner | Admitting: Nurse Practitioner

## 2023-02-24 VITALS — BP 102/68 | HR 88 | Temp 97.5°F | Ht 69.5 in | Wt 215.6 lb

## 2023-02-24 DIAGNOSIS — R6889 Other general symptoms and signs: Secondary | ICD-10-CM

## 2023-02-24 DIAGNOSIS — J40 Bronchitis, not specified as acute or chronic: Secondary | ICD-10-CM | POA: Diagnosis not present

## 2023-02-24 DIAGNOSIS — R051 Acute cough: Secondary | ICD-10-CM

## 2023-02-24 DIAGNOSIS — Z1152 Encounter for screening for COVID-19: Secondary | ICD-10-CM

## 2023-02-24 DIAGNOSIS — R0989 Other specified symptoms and signs involving the circulatory and respiratory systems: Secondary | ICD-10-CM | POA: Diagnosis not present

## 2023-02-24 DIAGNOSIS — G4733 Obstructive sleep apnea (adult) (pediatric): Secondary | ICD-10-CM

## 2023-02-24 LAB — POCT INFLUENZA A/B
Influenza A, POC: NEGATIVE
Influenza B, POC: NEGATIVE

## 2023-02-24 LAB — POC COVID19 BINAXNOW: SARS Coronavirus 2 Ag: NEGATIVE

## 2023-02-24 MED ORDER — DEXAMETHASONE 4 MG PO TABS: ORAL_TABLET | ORAL | 0 refills | Status: DC

## 2023-02-24 MED ORDER — PROMETHAZINE-DM 6.25-15 MG/5ML PO SYRP
5.00 mL | ORAL_SOLUTION | Freq: Four times a day (QID) | ORAL | 1 refills | Status: AC | PRN
Start: 2023-02-24 — End: ?

## 2023-02-24 MED ORDER — AZITHROMYCIN 250 MG PO TABS
ORAL_TABLET | ORAL | 1 refills | Status: AC
Start: 2023-02-24 — End: ?

## 2023-02-24 NOTE — Progress Notes (Signed)
Assessment and Plan:   Jamoni was seen today for pneumonia.  Diagnoses and all orders for this visit:  Labile hypertension - Controlled without medications. Continue DASH diet, exercise and monitor at home. Call if greater than 130/80.   OSA on CPAP Continue CPAP and follow with Dr. Vickey Huger   Encounter for screening for COVID-19 [Z11.52] Negative  Flu-like symptoms [R68.89] Negative  Bronchitis Begin medications, if CXR shows pneumonia will add second antibiotic If no improvement in the next 3 days notify the office -     dexamethasone (DECADRON) 4 MG tablet; Take 3 tabs for 3 days, 2 tabs for 3 days 1 tab for 5 days. Take with food. -     azithromycin (ZITHROMAX) 250 MG tablet; Take 2 tablets (500 mg) on  Day 1,  followed by 1 tablet (250 mg) once daily on Days 2 through 5. -     PR PRESSURIZED/NONPRESSURIZED INHALATION TREATMENT -     PR ALBUTEROL IPRATROP NON-COMP -     PR NEBULIZER ADMINISTRATION SET  Acute cough Begin medications, if CXR shows pneumonia will add second antibiotic If no improvement in the next 3 days notify the office -     DG Chest 2 View; Future -     promethazine-dextromethorphan (PROMETHAZINE-DM) 6.25-15 MG/5ML syrup; Take 5 mLs by mouth 4 (four) times daily as needed for cough. -     PR PRESSURIZED/NONPRESSURIZED INHALATION TREATMENT -     PR ALBUTEROL IPRATROP NON-COMP -     PR NEBULIZER ADMINISTRATION SET       Further disposition pending results of labs. Discussed med's effects and SE's.   Over 30 minutes of exam, counseling, chart review, and critical decision making was performed.   Future Appointments  Date Time Provider Department Center  02/24/2023 11:00 AM Raynelle Dick, NP [facility] None  05/14/2023 10:00 AM Lucky Cowboy, MD [facility] None  09/28/2023 11:00 AM Lomax, Amy, NP [facility] None    ------------------------------------------------------------------------------------------------------------------   HPI BP 102/68    Pulse 88   Temp (!) 97.5 F (36.4 C)   Ht 5' 9.5" (1.765 m)   Wt 215 lb 9.6 oz (97.8 kg)   SpO2 96%   BMI 31.38 kg/m   62 y.o.male presents for productive cough of green/yellow mucus, body aches, headaches, chills which have been present for approximately 15 days.  He will also get green/yellow mucus from nose as well. His father and brother have both been diagnosed with pneumonia.  He has not tried any medication from over the counter.    BP is currently well controlled without medication. Denies chest pain, dizziness.  BP Readings from Last 3 Encounters:  02/24/23 102/68  02/03/23 112/82  10/22/22 136/80   BMI is Body mass index is 31.38 kg/m., he has been working on diet and exercise. He is down 8 pounds in the past 4 months.  Wt Readings from Last 3 Encounters:  02/24/23 215 lb 9.6 oz (97.8 kg)  02/03/23 221 lb 6.4 oz (100.4 kg)  10/22/22 223 lb (101.2 kg)   Obstructive sleep apnea follows with Dr. Vickey Huger but is not consistent with CPAP usage.  Continues to have ongoing difficulty with insomnia and feeling rested. Cognitive behavioral therapy has been recommended but he has never gone to therapy. Believe insomnia secondary to PTSD. Has been started on Zoloft 25 mg every day.  Past Medical History:  Diagnosis Date   Acid reflux    Arthritis    Blood dyscrasia    ITP  Bruise 08/08/11   pt states brusing x 1 month   Chest pain 08/08/2011   "a little in last month"   Disorder of both ears 08/08/2011   blood in ears in past month - put on steroids, "tubes stopped up"   Fracture of left clavicle 02/08/2015   Hypercholesteremia    Hypertension, Labile 11/03/2013   no meds   Idiopathic thrombocytopenic purpura (ITP) (HCC)    Leg cramps 08/08/2011   bad in last 1.5 months   Morbid obesity (HCC)    Prediabetes    Sleep apnea    wears CPAP "sometimes"   Tick bites 08/08/11   60 to 70 ticks in last hunting season , last > 1 month     Allergies  Allergen Reactions    Niacin And Related Swelling    Red rash and swelling   Gemfibrozil    Penicillins Other (See Comments)    Unknown childhood allergy Received 2 Gms Ancef with no obvious reaction    Current Outpatient Medications on File Prior to Visit  Medication Sig   ASPIRIN 81 PO Take by mouth daily.   Cholecalciferol (VITAMIN D PO) Take 10,000 Int'l Units by mouth daily.    diclofenac Sodium (VOLTAREN) 1 % GEL Apply 4 g topically 4 (four) times daily. (Patient not taking: Reported on 02/03/2023)   econazole nitrate 1 % cream Apply topically daily.   Flaxseed, Linseed, (FLAXSEED OIL) 1200 MG CAPS Take 1 capsule by mouth daily.    fluticasone (FLONASE) 50 MCG/ACT nasal spray SPRAY 1 TO 2 SPRAYS IN EACH NOSTRIL 1 TO 2 TIMES DAILY   gabapentin (NEURONTIN) 300 MG capsule TAKE 1 CAPSULE 3 TIMES DAILY AS NEEDED FOR NEUROPATHY PAINS   Magnesium 500 MG TABS Take by mouth daily.   meloxicam (MOBIC) 7.5 MG tablet TAKE 1 TABLET BY MOUTH EVERY DAY   Multiple Vitamins-Minerals (ZINC PO) Take 25.5 mg by mouth daily.   Omega-3 Fatty Acids (FISH OIL) 1200 MG CAPS Take 1 capsule by mouth daily.    rosuvastatin (CRESTOR) 40 MG tablet TAKE 1 TABLET BY MOUTH EVERY DAY FOR CHOLESTEROL (Patient taking differently: Take 20 mg by mouth daily. TAKE 1 TABLET BY MOUTH EVERY DAY FOR CHOLESTEROL)   sertraline (ZOLOFT) 25 MG tablet Take 1 tablet (25 mg total) by mouth at bedtime.   No current facility-administered medications on file prior to visit.    ROS: all negative except above.   Physical Exam:  BP 102/68   Pulse 88   Temp (!) 97.5 F (36.4 C)   Ht 5' 9.5" (1.765 m)   Wt 215 lb 9.6 oz (97.8 kg)   SpO2 96%   BMI 31.38 kg/m   General Appearance: Well nourished, in mildt distress. Eyes: PERRLA, EOMs, conjunctiva no swelling or erythema Sinuses: Positive Frontal/maxillary tenderness ENT/Mouth: Ext aud canals clear, TMs without erythema, bulging. Erythematous post pharynx, no exudate  Hearing normal.  Neck: Supple,  thyroid normal.  Respiratory: Respiratory effort normal, crackles right lower base, slightly diminished in lower lobes bilaterally- coughed up thick green/yellow sputum Cardio: RRR with no MRGs. Brisk peripheral pulses without edema.  Abdomen: Soft, + BS.  Non tender, no guarding, rebound, hernias, masses. Lymphatics: Positive right cervical adenopathy Musculoskeletal: Full ROM, 5/5 strength, normal gait.  Skin: Warm, dry without rashes, lesions, ecchymosis.  Neuro: Cranial nerves intact. Normal muscle tone, no cerebellar symptoms. Sensation intact.  Psych: Awake and oriented X 3, normal affect, Insight and Judgment appropriate.     Labrisha Wuellner E  Aundria Rud, NP 10:58 AM [facility]

## 2023-02-24 NOTE — Patient Instructions (Addendum)
Chest xray has been ordered at [facility] [address] [place] In M-F 8:30-3:45   Start azithromycin 2 tabs the first day and 1 tab a day for the next 4 days  Start Dexamethasone 3 tabs for 3 days, 2 tabs for 3 day and 1 tab for 5 days  Promethazine DM cough syrup 5 ml every 8 hours as needed, can make you sleepy  If chest xray shows pneumonia will plan to add a second antibiotic  Can try Mucinex over the counter to thin secretions  Acute Bronchitis, Adult  Acute bronchitis is sudden inflammation of the main airways (bronchi) that come off the windpipe (trachea) in the lungs. The swelling causes the airways to get smaller and make more mucus than normal. This can make it hard to breathe and can cause coughing or noisy breathing (wheezing). Acute bronchitis may last several weeks. The cough may last longer. Allergies, asthma, and exposure to smoke may make the condition worse. What are the causes? This condition can be caused by germs and by substances that irritate the lungs, including: Cold and flu viruses. The most common cause of this condition is the virus that causes the common cold. Bacteria. This is less common. Breathing in substances that irritate the lungs, including: Smoke from cigarettes and other forms of tobacco. Dust and pollen. Fumes from household cleaning products, gases, or burned fuel. Indoor or outdoor air pollution. What increases the risk? The following factors may make you more likely to develop this condition: A weak body's defense system, also called the immune system. A condition that affects your lungs and breathing, such as asthma. What are the signs or symptoms? Common symptoms of this condition include: Coughing. This may bring up clear, yellow, or green mucus from your lungs (sputum). Wheezing. Runny or stuffy nose. Having too much mucus in your lungs (chest congestion). Shortness of breath. Aches and pains, including sore  throat or chest. How is this diagnosed? This condition is usually diagnosed based on: Your symptoms and medical history. A physical exam. You may also have other tests, including tests to rule out other conditions, such as pneumonia. These tests include: A test of lung function. Test of a mucus sample to look for the presence of bacteria. Tests to check the oxygen level in your blood. Blood tests. Chest X-ray. How is this treated? Most cases of acute bronchitis clear up over time without treatment. Your health care provider may recommend: Drinking more fluids to help thin your mucus so it is easier to cough up. Taking inhaled medicine (inhaler) to improve air flow in and out of your lungs. Using a vaporizer or a humidifier. These are machines that add water to the air to help you breathe better. Taking a medicine that thins mucus and clears congestion (expectorant). Taking a medicine that prevents or stops coughing (cough suppressant). It is not common to take an antibiotic medicine for this condition. Follow these instructions at home:  Take over-the-counter and prescription medicines only as told by your health care provider. Use an inhaler, vaporizer, or humidifier as told by your health care provider. Take two teaspoons (10 mL) of honey at bedtime to lessen coughing at night. Drink enough fluid to keep your urine pale yellow. Do not use any products that contain nicotine or tobacco. These products include cigarettes, chewing tobacco, and vaping devices, such as e-cigarettes. If you need help quitting, ask your health care provider. Get plenty of rest. Return to your normal activities as  told by your health care provider. Ask your health care provider what activities are safe for you. Keep all follow-up visits. This is important. How is this prevented? To lower your risk of getting this condition again: Wash your hands often with soap and water for at least 20 seconds. If soap and  water are not available, use hand sanitizer. Avoid contact with people who have cold symptoms. Try not to touch your mouth, nose, or eyes with your hands. Avoid breathing in smoke or chemical fumes. Breathing smoke or chemical fumes will make your condition worse. Get the flu shot every year. Contact a health care provider if: Your symptoms do not improve after 2 weeks. You have trouble coughing up the mucus. Your cough keeps you awake at night. You have a fever. Get help right away if you: Cough up blood. Feel pain in your chest. Have severe shortness of breath. Faint or keep feeling like you are going to faint. Have a severe headache. Have a fever or chills that get worse. These symptoms may represent a serious problem that is an emergency. Do not wait to see if the symptoms will go away. Get medical help right away. Call your local emergency services (911 in the [place] Do not drive yourself to the hospital. Summary Acute bronchitis is inflammation of the main airways (bronchi) that come off the windpipe (trachea) in the lungs. The swelling causes the airways to get smaller and make more mucus than normal. Drinking more fluids can help thin your mucus so it is easier to cough up. Take over-the-counter and prescription medicines only as told by your health care provider. Do not use any products that contain nicotine or tobacco. These products include cigarettes, chewing tobacco, and vaping devices, such as e-cigarettes. If you need help quitting, ask your health care provider. Contact a health care provider if your symptoms do not improve after 2 weeks. This information is not intended to replace advice given to you by your health care provider. Make sure you discuss any questions you have with your health care provider. Document Revised: 11/14/2021 Document Reviewed: 12/05/2020 Elsevier Patient Education  2024 [employer].

## 2023-03-03 ENCOUNTER — Other Ambulatory Visit: Payer: Self-pay | Admitting: Nurse Practitioner

## 2023-03-03 DIAGNOSIS — Z8659 Personal history of other mental and behavioral disorders: Secondary | ICD-10-CM

## 2023-03-03 DIAGNOSIS — F5104 Psychophysiologic insomnia: Secondary | ICD-10-CM

## 2023-04-01 ENCOUNTER — Other Ambulatory Visit: Payer: Self-pay | Admitting: Nurse Practitioner

## 2023-04-01 ENCOUNTER — Other Ambulatory Visit: Payer: Self-pay | Admitting: Internal Medicine

## 2023-04-01 DIAGNOSIS — M2550 Pain in unspecified joint: Secondary | ICD-10-CM

## 2023-04-01 DIAGNOSIS — E782 Mixed hyperlipidemia: Secondary | ICD-10-CM

## 2023-04-01 DIAGNOSIS — G540 Brachial plexus disorders: Secondary | ICD-10-CM

## 2023-04-17 ENCOUNTER — Encounter: Payer: Commercial Managed Care - HMO | Admitting: Internal Medicine

## 2023-05-13 ENCOUNTER — Encounter: Payer: Self-pay | Admitting: Internal Medicine

## 2023-05-13 NOTE — Patient Instructions (Signed)
Due to recent changes in healthcare laws, you may see the results of your imaging and laboratory studies on MyChart before your provider has had a chance to review them.  We understand that in some cases there may be results that are confusing or concerning to you. Not all laboratory results come back in the same time frame and the provider may be waiting for multiple results in order to interpret others.  Please give [place] 48 hours in order for your provider to thoroughly review all the results before contacting the office for clarification of your results.   ++++++++++++++++++++++++++++++++++++++  Vit D  & Vit C 1,000 mg   are recommended to help protect  against the Covid-19 and other Corona viruses.    Also it's recommended  to take  Zinc 50 mg  to help  protect against the Covid-19   and best place to get  is also on [email or web address]  and don't pay more than 6-8 cents /pill !  =============================== Coronavirus (COVID-19) Are you at risk?  Are you at risk for the Coronavirus (COVID-19)?  To be considered HIGH RISK for Coronavirus (COVID-19), you have to meet the following criteria:  Traveled to [place], [place], [place], [place] or [place]; or in the [place] to [place], [place], [place]  or [place]; and have fever, cough, and shortness of breath within the last 2 weeks of travel OR Been in close contact with a person diagnosed with COVID-19 within the last 2 weeks and have  fever, cough,and shortness of breath  IF YOU DO NOT MEET THESE CRITERIA, YOU ARE CONSIDERED LOW RISK FOR COVID-19.  What to do if you are HIGH RISK for COVID-19?  If you are having a medical emergency, call 911. Seek medical care right away. Before you go to a doctor's office, urgent care or emergency department,  call ahead and tell them about your recent travel, contact with someone diagnosed with COVID-19   and your symptoms.  You should receive instructions from your physician's office  regarding next steps of care.  When you arrive at healthcare provider, tell the healthcare staff immediately you have returned from  visiting [place], [place], [place], [place] or [place]; or traveled in the [place] to [place], [place],  [place] or [place] in the last two weeks or you have been in close contact with a person diagnosed with  COVID-19 in the last 2 weeks.   Tell the health care staff about your symptoms: fever, cough and shortness of breath. After you have been seen by a medical provider, you will be either: Tested for (COVID-19) and discharged home on quarantine except to seek medical care if  symptoms worsen, and asked to  Stay home and avoid contact with others until you get your results (4-5 days)  Avoid travel on public transportation if possible (such as bus, train, or airplane) or Sent to the Emergency Department by EMS for evaluation, COVID-19 testing  and  possible admission depending on your condition and test results.  What to do if you are LOW RISK for COVID-19?  Reduce your risk of any infection by using the same precautions used for avoiding the common cold or flu:  Wash your hands often with soap and warm water for at least 20 seconds.  If soap and water are not readily available,  use an alcohol-based hand sanitizer with at least 60% alcohol.  If coughing or sneezing, cover your mouth and nose by coughing  or sneezing into the elbow areas of your shirt or coat,  into a tissue or into your sleeve (not your hands). Avoid shaking hands with others and consider head nods or verbal greetings only. Avoid touching your eyes, nose, or mouth with unwashed hands.  Avoid close contact with people who are sick. Avoid places or events with large numbers of people in one location, like concerts or sporting events. Carefully consider travel plans you have or are making. If you are planning any travel outside or inside the [place], visit the CDC's Travelers' Health  webpage for the latest health notices. If you have some symptoms but not all symptoms, continue to monitor at home and seek medical attention  if your symptoms worsen. If you are having a medical emergency, call 911. >>>>>>>>>>>>>>>>>>>>>>>>>>>> Preventive Care for Adults  A healthy lifestyle and preventive care can promote health and wellness. Preventive health guidelines for men include the following key practices: A routine yearly physical is a good way to check with your health care provider about your health and preventative screening. It is a chance to share any concerns and updates on your health and to receive a thorough exam. Visit your dentist for a routine exam and preventative care every 6 months. Brush your teeth twice a day and floss once a day. Good oral hygiene prevents tooth decay and gum disease. The frequency of eye exams is based on your age, health, family medical history, use of contact lenses, and other factors. Follow your health care provider's recommendations for frequency of eye exams. Eat a healthy diet. Foods such as vegetables, fruits, whole grains, low-fat dairy products, and lean protein foods contain the nutrients you need without too many calories. Decrease your intake of foods high in solid fats, added sugars, and salt. Eat the right amount of calories for you. Get information about a proper diet from your health care provider, if necessary. Regular physical exercise is one of the most important things you can do for your health. Most adults should get at least 150 minutes of moderate-intensity exercise (any activity that increases your heart rate and causes you to sweat) each week. In addition, most adults need muscle-strengthening exercises on 2 or more days a week. Maintain a healthy weight. The body mass index (BMI) is a screening tool to identify possible weight problems. It provides an estimate of body fat based on height and weight. Your health care provider can  find your BMI and can help you achieve or maintain a healthy weight. For adults 20 years and older: A BMI below 18.5 is considered underweight. A BMI of 18.5 to 24.9 is normal. A BMI of 25 to 29.9 is considered overweight. A BMI of 30 and above is considered obese. Maintain normal blood lipids and cholesterol levels by exercising and minimizing your intake of saturated fat. Eat a balanced diet with plenty of fruit and vegetables. Blood tests for lipids and cholesterol should begin at age 33 and be repeated every 5 years. If your lipid or cholesterol levels are high, you are over 50, or you are at high risk for heart disease, you may need your cholesterol levels checked more frequently. Ongoing high lipid and cholesterol levels should be treated with medicines if diet and exercise are not working. If you smoke, find out from your health care provider how to quit. If you do not use tobacco, do not start. Lung cancer screening is recommended for adults aged 55-80 years who are at high risk for  developing lung cancer because of a history of smoking. A yearly low-dose CT scan of the lungs is recommended for people who have at least a 30-pack-year history of smoking and are a current smoker or have quit within the past 15 years. A pack year of smoking is smoking an average of 1 pack of cigarettes a day for 1 year (for example: 1 pack a day for 30 years or 2 packs a day for 15 years). Yearly screening should continue until the smoker has stopped smoking for at least 15 years. Yearly screening should be stopped for people who develop a health problem that would prevent them from having lung cancer treatment. If you choose to drink alcohol, do not have more than 2 drinks per day. One drink is considered to be 12 ounces (355 mL) of beer, 5 ounces (148 mL) of wine, or 1.5 ounces (44 mL) of liquor. Avoid use of street drugs. Do not share needles with anyone. Ask for help if you need support or instructions about  stopping the use of drugs. High blood pressure causes heart disease and increases the risk of stroke. Your blood pressure should be checked at least every 1-2 years. Ongoing high blood pressure should be treated with medicines, if weight loss and exercise are not effective. If you are 49-35 years old, ask your health care provider if you should take aspirin to prevent heart disease. Diabetes screening involves taking a blood sample to check your fasting blood sugar level. This should be done once every 3 years, after age 61, if you are within normal weight and without risk factors for diabetes. Testing should be considered at a younger age or be carried out more frequently if you are overweight and have at least 1 risk factor for diabetes. Colorectal cancer can be detected and often prevented. Most routine colorectal cancer screening begins at the age of 44 and continues through age 71. However, your health care provider may recommend screening at an earlier age if you have risk factors for colon cancer. On a yearly basis, your health care provider may provide home test kits to check for hidden blood in the stool. Use of a small camera at the end of a tube to directly examine the colon (sigmoidoscopy or colonoscopy) can detect the earliest forms of colorectal cancer. Talk to your health care provider about this at age 28, when routine screening begins. Direct exam of the colon should be repeated every 5-10 years through age 45, unless early forms of precancerous polyps or small growths are found.  Talk with your health care provider about prostate cancer screening. Testicular cancer screening isrecommended for adult males. Screening includes self-exam, a health care provider exam, and other screening tests. Consult with your health care provider about any symptoms you have or any concerns you have about testicular cancer. Use sunscreen. Apply sunscreen liberally and repeatedly throughout the day. You should  seek shade when your shadow is shorter than you. Protect yourself by wearing long sleeves, pants, a wide-brimmed hat, and sunglasses year round, whenever you are outdoors. Once a month, do a whole-body skin exam, using a mirror to look at the skin on your back. Tell your health care provider about new moles, moles that have irregular borders, moles that are larger than a pencil eraser, or moles that have changed in shape or color. Stay current with required vaccines (immunizations). Influenza vaccine. All adults should be immunized every year. Tetanus, diphtheria, and acellular pertussis (Td, Tdap) vaccine. An  adult who has not previously received Tdap or who does not know his vaccine status should receive 1 dose of Tdap. This initial dose should be followed by tetanus and diphtheria toxoids (Td) booster doses every 10 years. Adults with an unknown or incomplete history of completing a 3-dose immunization series with Td-containing vaccines should begin or complete a primary immunization series including a Tdap dose. Adults should receive a Td booster every 10 years. Varicella vaccine. An adult without evidence of immunity to varicella should receive 2 doses or a second dose if he has previously received 1 dose. Human papillomavirus (HPV) vaccine. Males aged 13-21 years who have not received the vaccine previously should receive the 3-dose series. Males aged 22-26 years may be immunized. Immunization is recommended through the age of 9 years for any male who has sex with males and did not get any or all doses earlier. Immunization is recommended for any person with an immunocompromised condition through the age of 26 years if he did not get any or all doses earlier. During the 3-dose series, the second dose should be obtained 4-8 weeks after the first dose. The third dose should be obtained 24 weeks after the first dose and 16 weeks after the second dose. Zoster vaccine. One dose is recommended for adults  aged 50 years or older unless certain conditions are present.  PREVNAR  - Pneumococcal 13-valent conjugate (PCV13) vaccine. When indicated, a person who is uncertain of his immunization history and has no record of immunization should receive the PCV13 vaccine. An adult aged 54 years or older who has certain medical conditions and has not been previously immunized should receive 1 dose of PCV13 vaccine. This PCV13 should be followed with a dose of pneumococcal polysaccharide (PPSV23) vaccine. The PPSV23 vaccine dose should be obtained at least 1 r more year(s) after the dose of PCV13 vaccine. An adult aged 78 years or older who has certain medical conditions and previously received 1 or more doses of PPSV23 vaccine should receive 1 dose of PCV13. The PCV13 vaccine dose should be obtained 1 or more years after the last PPSV23 vaccine dose.  PNEUMOVAX - Pneumococcal polysaccharide (PPSV23) vaccine. When PCV13 is also indicated, PCV13 should be obtained first. All adults aged 62 years and older should be immunized. An adult younger than age 61 years who has certain medical conditions should be immunized. Any person who resides in a nursing home or long-term care facility should be immunized. An adult smoker should be immunized. People with an immunocompromised condition and certain other conditions should receive both PCV13 and PPSV23 vaccines. People with human immunodeficiency virus (HIV) infection should be immunized as soon as possible after diagnosis. Immunization during chemotherapy or radiation therapy should be avoided. Routine use of PPSV23 vaccine is not recommended for American Indians, [address], or people younger than 65 years unless there are medical conditions that require PPSV23 vaccine. When indicated, people who have unknown immunization and have no record of immunization should receive PPSV23 vaccine. One-time revaccination 5 years after the first dose of PPSV23 is recommended for people  aged 19-64 years who have chronic kidney failure, nephrotic syndrome, asplenia, or immunocompromised conditions. People who received 1-2 doses of PPSV23 before age 43 years should receive another dose of PPSV23 vaccine at age 40 years or later if at least 5 years have passed since the previous dose. Doses of PPSV23 are not needed for people immunized with PPSV23 at or after age 23 years.  Hepatitis A vaccine.  Adults who wish to be protected from this disease, have certain high-risk conditions, work with hepatitis A-infected animals, work in hepatitis A research labs, or travel to or work in countries with a high rate of hepatitis A should be immunized. Adults who were previously unvaccinated and who anticipate close contact with an international adoptee during the first 60 days after arrival in the [place] from a country with a high rate of hepatitis A should be immunized.  Hepatitis B vaccine. Adults should be immunized if they wish to be protected from this disease, have certain high-risk conditions, may be exposed to blood or other infectious body fluids, are household contacts or sex partners of hepatitis B positive people, are clients or workers in certain care facilities, or travel to or work in countries with a high rate of hepatitis B.  Preventive Service / Frequency  Ages 97 to 71 Blood pressure check. Lipid and cholesterol check Lung cancer screening. / Every year if you are aged 55-80 years and have a 30-pack-year history of smoking and currently smoke or have quit within the past 15 years. Yearly screening is stopped once you have quit smoking for at least 15 years or develop a health problem that would prevent you from having lung cancer treatment. Fecal occult blood test (FOBT) of stool. / Every year beginning at age 30 and continuing until age 51. You may not have to do this test if you get a colonoscopy every 10 years. Flexible sigmoidoscopy** or colonoscopy.** / Every 5 years for  a flexible sigmoidoscopy or every 10 years for a colonoscopy beginning at age 65 and continuing until age 43. Screening for abdominal aortic aneurysm (AAA)  by ultrasound is recommended for people who have history of high blood pressure or who are current or former smokers. +++++++++++ Recommend Adult Low Dose Aspirin or  coated  Aspirin 81 mg daily  To reduce risk of Colon Cancer 40 %,  Skin Cancer 26 % ,  Malignant Melanoma 46%  and  Pancreatic cancer 60% ++++++++++++++++++++ Vitamin D goal  is between 70-100.  Please make sure that you are taking your Vitamin D as directed.  It is very important as a natural anti-inflammatory  helping hair, skin, and nails, as well as reducing stroke and heart attack risk.  It helps your bones and helps with mood. It also decreases numerous cancer risks so please take it as directed.  Low Vit D is associated with a 200-300% higher risk for CANCER  and 200-300% higher risk for HEART   ATTACK  &  STROKE.   .....................................Marland Kitchen It is also associated with higher death rate at younger ages,  autoimmune diseases like Rheumatoid arthritis, Lupus, Multiple Sclerosis.    Also many other serious conditions, like depression, Alzheimer's Dementia, infertility, muscle aches, fatigue, fibromyalgia - just to name a few. +++++++++++++++++++++ Recommend the book "The END of DIETING" by Dr Monico Hoar  & the book "The END of DIABETES " by Dr Monico Hoar At [email or web address] - get book & Audio CD's    Being diabetic has a  300% increased risk for heart attack, stroke, cancer, and alzheimer- type vascular dementia. It is very important that you work harder with diet by avoiding all foods that are white. Avoid white rice (brown & wild rice is OK), white potatoes (sweetpotatoes in moderation is OK), White bread or wheat bread or anything made out of white flour like bagels, donuts, rolls, buns, biscuits, cakes, pastries, cookies, pizza crust, and pasta (made  from white flour & egg whites) - vegetarian pasta or spinach or wheat pasta is OK. Multigrain breads like Arnold's or [employer], or multigrain sandwich thins or flatbreads.  Diet, exercise and weight loss can reverse and cure diabetes in the early stages.  Diet, exercise and weight loss is very important in the control and prevention of complications of diabetes which affects every system in your body, ie. Brain - dementia/stroke, eyes - glaucoma/blindness, heart - heart attack/heart failure, kidneys - dialysis, stomach - gastric paralysis, intestines - malabsorption, nerves - severe painful neuritis, circulation - gangrene & loss of a leg(s), and finally cancer and Alzheimers.    I recommend avoid fried & greasy foods,  sweets/candy, white rice (brown or wild rice or Quinoa is OK), white potatoes (sweet potatoes are OK) - anything made from white flour - bagels, doughnuts, rolls, buns, biscuits,white and wheat breads, pizza crust and traditional pasta made of white flour & egg white(vegetarian pasta or spinach or wheat pasta is OK).  Multi-grain bread is OK - like multi-grain flat bread or sandwich thins. Avoid alcohol in excess. Exercise is also important.    Eat all the vegetables you want - avoid meat, especially red meat and dairy - especially cheese.  Cheese is the most concentrated form of trans-fats which is the worst thing to clog up our arteries. Veggie cheese is OK which can be found in the fresh produce section at [employer] or [employer] or [employer]  ++++++++++++++++++++++ DASH Eating Plan  DASH stands for "Dietary Approaches to Stop Hypertension."   The DASH eating plan is a healthy eating plan that has been shown to reduce high blood pressure (hypertension). Additional health benefits may include reducing the risk of type 2 diabetes mellitus, heart disease, and stroke. The DASH eating plan may also help with weight loss. WHAT DO I NEED TO KNOW ABOUT THE DASH EATING PLAN? For  the DASH eating plan, you will follow these general guidelines: Choose foods with a percent daily value for sodium of less than 5% (as listed on the food label). Use salt-free seasonings or herbs instead of table salt or sea salt. Check with your health care provider or pharmacist before using salt substitutes. Eat lower-sodium products, often labeled as "lower sodium" or "no salt added." Eat fresh foods. Eat more vegetables, fruits, and low-fat dairy products. Choose whole grains. Look for the word "whole" as the first word in the ingredient list. Choose fish  Limit sweets, desserts, sugars, and sugary drinks. Choose heart-healthy fats. Eat veggie cheese  Eat more home-cooked food and less restaurant, buffet, and fast food. Limit fried foods. Cook foods using methods other than frying. Limit canned vegetables. If you do use them, rinse them well to decrease the sodium. When eating at a restaurant, ask that your food be prepared with less salt, or no salt if possible.                      WHAT FOODS CAN I EAT? Read Dr Francis Dowse Fuhrman's books on The End of Dieting & The End of Diabetes  Grains Whole grain or whole wheat bread. Brown rice. Whole grain or whole wheat pasta. Quinoa, bulgur, and whole grain cereals. Low-sodium cereals. Corn or whole wheat flour tortillas. Whole grain cornbread. Whole grain crackers. Low-sodium crackers.  Vegetables Fresh or frozen vegetables (raw, steamed, roasted, or grilled). Low-sodium or reduced-sodium tomato and vegetable juices. Low-sodium or reduced-sodium tomato sauce and paste. Low-sodium or reduced-sodium canned vegetables.  Fruits All fresh, canned (in natural juice), or frozen fruits.  Protein Products  All fish and seafood.  Dried beans, peas, or lentils. Unsalted nuts and seeds. Unsalted canned beans.  Dairy Low-fat dairy products, such as skim or 1% milk, 2% or reduced-fat cheeses, low-fat ricotta or cottage cheese, or plain low-fat yogurt.  Low-sodium or reduced-sodium cheeses.  Fats and Oils Tub margarines without trans fats. Light or reduced-fat mayonnaise and salad dressings (reduced sodium). Avocado. Safflower, olive, or canola oils. Natural peanut or almond butter.  Other Unsalted popcorn and pretzels. The items listed above may not be a complete list of recommended foods or beverages. Contact your dietitian for more options.  +++++++++++++++++++  WHAT FOODS ARE NOT RECOMMENDED? Grains/ White flour or wheat flour White bread. White pasta. White rice. Refined cornbread. Bagels and croissants. Crackers that contain trans fat.  Vegetables  Creamed or fried vegetables. Vegetables in a . Regular canned vegetables. Regular canned tomato sauce and paste. Regular tomato and vegetable juices.  Fruits Dried fruits. Canned fruit in light or heavy syrup. Fruit juice.  Meat and Other Protein Products Meat in general - RED meat & White meat.  Fatty cuts of meat. Ribs, chicken wings, all processed meats as bacon, sausage, bologna, salami, fatback, hot dogs, bratwurst and packaged luncheon meats.  Dairy Whole or 2% milk, cream, half-and-half, and cream cheese. Whole-fat or sweetened yogurt. Full-fat cheeses or blue cheese. Non-dairy creamers and whipped toppings. Processed cheese, cheese spreads, or cheese curds.  Condiments Onion and garlic salt, seasoned salt, table salt, and sea salt. Canned and packaged gravies. Worcestershire sauce. Tartar sauce. Barbecue sauce. Teriyaki sauce. Soy sauce, including reduced sodium. Steak sauce. Fish sauce. Oyster sauce. Cocktail sauce. Horseradish. Ketchup and mustard. Meat flavorings and tenderizers. Bouillon cubes. Hot sauce. Tabasco sauce. Marinades. Taco seasonings. Relishes.  Fats and Oils Butter, stick margarine, lard, shortening and bacon fat. Coconut, palm kernel, or palm oils. Regular salad dressings.  Pickles and olives. Salted popcorn and pretzels.  The items listed above may not  be a complete list of foods and beverages to avoid.

## 2023-05-13 NOTE — Progress Notes (Signed)
Annual  Screening/Preventative Visit  & Comprehensive Evaluation & Examination   Future Appointments  Date Time Provider Department  05/14/2023                   cpe 10:00 AM Lucky Cowboy, MD [facility]  09/28/2023 11:00 AM Shawnie Dapper, NP [facility]  05/20/2024                    cpe 10:00 AM Lucky Cowboy, MD [facility]         This very nice 62 y.o. [name] with HTN, HLD, Prediabetes and Vitamin D Deficiency presents for a Screening /Preventative Visit & comprehensive evaluation and management of multiple medical co-morbidities. Patient is followed by Dr Vickey Huger  for OSA & CPAP sporadically.   In 2012, he was Dx'd with ITP and was treated by Dr Gaylyn Rong and was released as he went into remission.       Patient has been followed for labile HTN (2012 ). Patient's BP has been controlled and today's BP is at goal -  136/86 . Patient denies any cardiac symptoms as chest pain, palpitations, shortness of breath, dizziness or ankle swelling.        Patient's hyperlipidemia is controlled with diet and Rosuvastatin.  Patient denies myalgias or other medication SE's. Last lipids were at goal except elevated Trig's :  Lab Results  Component Value Date   CHOL 94 02/03/2023   HDL 29 (L) 02/03/2023   LDLCALC 42 02/03/2023   TRIG 143 02/03/2023   CHOLHDL 3.2 02/03/2023         Patient has hx/o prediabetes (A1c 5.9%  /2012) and patient denies reactive hypoglycemic symptoms, visual blurring, diabetic polys or paresthesias. Last A1c was normal & at goal :   Lab Results  Component Value Date   HGBA1C 5.9 (H) 02/03/2023         Finally, patient has history of Vitamin D Deficiency  ("20" /2011) and last vitamin D was at goal :   Lab Results  Component Value Date   VD25OH 79 04/12/2021       Current Outpatient Medications on File Prior to Visit  Medication Sig   ASPIRIN 81 PO Take daily.   VITAMIN D 10,000 Units  Take  daily.    FLAXSEED OIL 1200 MG CAPS Take 1 capsule  daily.     FLONASE  nasal spray Use 1 to 2 sprays each Nostril 1 to 2 x /day   gabapentin 300 MG capsule Take  1 capsule  3 x /day  as needed for Neuropathy Pains   Magnesium 500 MG TABS Take  daily.   Omega-3 FISH OIL 1200 MG CAPS Take 1 capsule  daily.    rosuvastatin 40 MG tablet Take 1 tablet daily  for Cholesterol   Zinc 50 MG TABS Take  daily.     Allergies  Allergen Reactions   Niacin And Related Swelling    Red rash and swelling   Gemfibrozil    Penicillins Other (See Comments)    Unknown childhood allergy Received 2 Gms Ancef with no obvious reaction   Past Medical History:  Diagnosis Date   Acid reflux    Arthritis    Blood dyscrasia    ITP   Bruise 08/08/11   pt states brusing x 1 month   Chest pain 08/08/2011   "a little in last month"   Disorder of both ears 08/08/2011   blood in ears in past month -  put on steroids, "tubes stopped up"   Fracture of left clavicle 02/08/2015   Hypercholesteremia    Hypertension, Labile 11/03/2013   no meds   Idiopathic thrombocytopenic purpura (ITP) (HCC)    Leg cramps 08/08/2011   bad in last 1.5 months   Morbid obesity (HCC)    Prediabetes    Sleep apnea    wears CPAP "sometimes"   Tick bites 08/08/11   60 to 70 ticks in last hunting season , last > 1 month     Health Maintenance  Topic Date Due   Zoster Vaccines- Shingrix (1 of 2) Never done   Pneumococcal Vaccine  05/19/2013   COVID-19 Vaccine  09/25/2020   INFLUENZA VACCINE  03/18/2021   TETANUS/TDAP  02/03/2025   COLONOSCOPY  04/02/2027   Hepatitis C Screening  Completed   HIV Screening  Completed   HPV VACCINES  Aged Out     Immunization History  Administered Date(s) Administered   Influenza Inj Mdck Quad  05/19/2018   Influenza Split 05/24/2013, 06/22/2014, 04/18/2017   Influenza, Seasonal 07/03/2015   Influenza,inj,Quad  07/06/2017, 05/10/2019   Influenza,inj,quad 08/07/2016   Influenza 05/19/2018, 06/25/2020   PFIZER SARS-COV-2 Vacc 11/01/2019, 11/22/2019,  06/25/2020   PD Test 08/07/2016, 09/09/2017   Pneumococcal -23 05/19/2012   Td 08/18/2008, 02/04/2015    Last Colon - 04/01/2017 - Dr Ewing Schlein Recc f/u 4-5 years ( 2022-2023)    - overdue  Past Surgical History:  Procedure Laterality Date   BACK SURGERY  07/2010   lumbar - 3 discs   FRACTURE SURGERY     right foot fracture, no surgeryu/   LUMBAR DISC SURGERY  2011   ORIF CLAVICULAR FRACTURE Left 02/08/2015   Procedure: OPEN REDUCTION INTERNAL FIXATION (ORIF) LEFT CLAVICULAR FRACTURE;  Surgeon: Teryl Lucy, MD;  Location: [facility];  Service: Orthopedics;  Laterality: Left;     Family History  Problem Relation Age of Onset   Uterine cancer Mother    Uterine cancer Sister    Stroke Neg Hx     Social History   Socioeconomic History   Marital status: Single   Years of education: 10   Highest education level: Not on file  Occupational History   Occupation: [employer]   Tobacco Use   Smoking status: Former    Packs/day: 1.00    Years: 8.00    Pack years: 8.00    Types: Cigarettes    Quit date: 01/03/2015    Years since quitting: 6.2   Smokeless tobacco: Current    Types: Chew   Tobacco comments:    smokes 1-2 cigarettes daily  Substance and Sexual Activity   Alcohol use: Yes    Alcohol/week: 0.0 standard drinks   Drug use: No    Comment: occasionally chews tobacco   Sexual activity: Yes      ROS Constitutional: Denies fever, chills, weight loss/gain, headaches, insomnia,  night sweats or change in appetite. Does c/o fatigue. Eyes: Denies redness, blurred vision, diplopia, discharge, itchy or watery eyes.  ENT: Denies discharge, congestion, post nasal drip, epistaxis, sore throat, earache, hearing loss, dental pain, Tinnitus, Vertigo, Sinus pain or snoring.  Cardio: Denies chest pain, palpitations, irregular heartbeat, syncope, dyspnea, diaphoresis, orthopnea, PND, claudication or edema Respiratory: denies cough, dyspnea, DOE, pleurisy,  hoarseness, laryngitis or wheezing.  Gastrointestinal: Denies dysphagia, heartburn, reflux, water brash, pain, cramps, nausea, vomiting, bloating, diarrhea, constipation, hematemesis, melena, hematochezia, jaundice or hemorrhoids Genitourinary: Denies dysuria, frequency, urgency, nocturia, hesitancy, discharge, hematuria  or flank pain Musculoskeletal: Denies arthralgia, myalgia, stiffness, Jt. Swelling, pain, limp or strain/sprain. Denies Falls. Skin: Denies puritis, rash, hives, warts, acne, eczema or change in skin lesion Neuro: No weakness, tremor, incoordination, spasms, paresthesia or pain Psychiatric: Denies confusion, memory loss or sensory loss. Denies Depression. Endocrine: Denies change in weight, skin, hair change, nocturia, and paresthesia, diabetic polys, visual blurring or hyper / hypo glycemic episodes.  Heme/Lymph: No excessive bleeding, bruising or enlarged lymph nodes.   Physical Exam  BP 136/86   Pulse 77   Temp 97.9 F (36.6 C)   Resp 16   Ht 5' 9.5" (1.765 m)   Wt 222 lb (100.7 kg)   SpO2 96%   BMI 32.31 kg/m   General Appearance: Over nourished and in no apparent distress.  Eyes: PERRLA, EOMs, conjunctiva no swelling or erythema, normal fundi and vessels. Sinuses: No frontal/maxillary tenderness ENT/Mouth: EACs patent / TMs  nl. Nares clear without erythema, swelling, mucoid exudates. Oral hygiene is good. No erythema, swelling, or exudate. Tongue normal, non-obstructing. Tonsils not swollen or erythematous. Hearing normal.  Neck: Supple, thyroid not palpable. No bruits, nodes or JVD. Respiratory: Respiratory effort normal.  BS equal and clear bilateral without rales, rhonci, wheezing or stridor. Cardio: Heart sounds are normal with regular rate and rhythm and no murmurs, rubs or gallops. Peripheral pulses are normal and equal bilaterally without edema. No aortic or femoral bruits. Chest: symmetric with normal excursions and percussion.  Abdomen: Soft, with Nl  bowel sounds. Nontender, no guarding, rebound, hernias, masses, or organomegaly.  Lymphatics: Non tender without lymphadenopathy.  Musculoskeletal: Full ROM all peripheral extremities, joint stability, 5/5 strength, and normal gait. Skin: Warm and dry without rashes, lesions, cyanosis, clubbing or  ecchymosis.  Neuro: Cranial nerves intact, reflexes equal bilaterally. Normal muscle tone, no cerebellar symptoms. Sensation intact.  Pysch: Alert and oriented X 3 with normal affect, insight and judgment appropriate.    Assessment and Plan   1. Annual Preventative/Screening Exam    2. Labile hypertension  - EKG 12-Lead - [place], RETROPERITNL ABD,  LTD - Urinalysis, Routine w reflex microscopic - Microalbumin / creatinine urine ratio - CBC with Differential/Platelet - COMPLETE METABOLIC PANEL WITH GFR - Magnesium - TSH   3. Hyperlipidemia, mixed  - EKG 12-Lead - [place], RETROPERITNL ABD,  LTD - Lipid panel - TSH   4. Abnormal glucose  - EKG 12-Lead - [place], RETROPERITNL ABD,  LTD - Hemoglobin A1c - Insulin, random   5. Vitamin D deficiency  - VITAMIN D 25 Hydroxy    6. Prediabetes  - Hemoglobin A1c - Insulin, random   7. OSA (obstructive sleep apnea)   8. Screening-pulmonary TB  - TB Skin Test   9. Prostate cancer screening  - PSA   10. Screening for colorectal cancer  - Ambulatory referral to Gastroenterology   11. Screening for heart disease  - EKG 12-Lead   12. Family history of stroke  - EKG 12-Lead - [place], RETROPERITNL ABD,  LTD   13. Former smoker  - EKG 12-Lead - [place], RETROPERITNL ABD,  LTD   14. Screening for AAA (aortic abdominal aneurysm)  - [place], RETROPERITNL ABD,  LTD   15. Fatigue  - Vitamin B12 - Iron, TIBC and Ferritin Panel - Testosterone - CBC with Differential/Platelet - TSH   16. Medication management  - Urinalysis, Routine w reflex microscopic - Microalbumin / creatinine urine ratio - CBC with  Differential/Platelet - COMPLETE METABOLIC PANEL WITH GFR - Magnesium - Lipid panel -  TSH - Hemoglobin A1c - Insulin, random - VITAMIN D 25 Hydroxy           Patient was counseled in prudent diet, weight control to achieve /maintain BMI less than 25, BP monitoring, regular exercise and medications as discussed.  Discussed med effects and SE's. Routine screening labs and tests as requested with regular follow-up as recommended. Over 40 minutes of exam, counseling, chart review and high complex critical decision making was performed   Marinus Maw, MD

## 2023-05-14 ENCOUNTER — Encounter: Payer: Self-pay | Admitting: Internal Medicine

## 2023-05-14 ENCOUNTER — Ambulatory Visit (INDEPENDENT_AMBULATORY_CARE_PROVIDER_SITE_OTHER): Payer: Commercial Managed Care - HMO | Admitting: Internal Medicine

## 2023-05-14 VITALS — BP 136/86 | HR 77 | Temp 97.9°F | Resp 16 | Ht 69.5 in | Wt 222.0 lb

## 2023-05-14 DIAGNOSIS — R0989 Other specified symptoms and signs involving the circulatory and respiratory systems: Secondary | ICD-10-CM

## 2023-05-14 DIAGNOSIS — Z87891 Personal history of nicotine dependence: Secondary | ICD-10-CM

## 2023-05-14 DIAGNOSIS — Z13220 Encounter for screening for lipoid disorders: Secondary | ICD-10-CM | POA: Diagnosis not present

## 2023-05-14 DIAGNOSIS — R5383 Other fatigue: Secondary | ICD-10-CM

## 2023-05-14 DIAGNOSIS — Z130 Encounter for screening for diseases of the blood and blood-forming organs and certain disorders involving the immune mechanism: Secondary | ICD-10-CM | POA: Diagnosis not present

## 2023-05-14 DIAGNOSIS — E782 Mixed hyperlipidemia: Secondary | ICD-10-CM

## 2023-05-14 DIAGNOSIS — I700 Atherosclerosis of aorta: Secondary | ICD-10-CM

## 2023-05-14 DIAGNOSIS — Z131 Encounter for screening for diabetes mellitus: Secondary | ICD-10-CM

## 2023-05-14 DIAGNOSIS — E559 Vitamin D deficiency, unspecified: Secondary | ICD-10-CM

## 2023-05-14 DIAGNOSIS — Z1389 Encounter for screening for other disorder: Secondary | ICD-10-CM

## 2023-05-14 DIAGNOSIS — Z79899 Other long term (current) drug therapy: Secondary | ICD-10-CM | POA: Diagnosis not present

## 2023-05-14 DIAGNOSIS — G4733 Obstructive sleep apnea (adult) (pediatric): Secondary | ICD-10-CM

## 2023-05-14 DIAGNOSIS — Z0000 Encounter for general adult medical examination without abnormal findings: Secondary | ICD-10-CM

## 2023-05-14 DIAGNOSIS — Z1211 Encounter for screening for malignant neoplasm of colon: Secondary | ICD-10-CM

## 2023-05-14 DIAGNOSIS — Z1329 Encounter for screening for other suspected endocrine disorder: Secondary | ICD-10-CM

## 2023-05-14 DIAGNOSIS — Z0001 Encounter for general adult medical examination with abnormal findings: Secondary | ICD-10-CM

## 2023-05-14 DIAGNOSIS — R7309 Other abnormal glucose: Secondary | ICD-10-CM

## 2023-05-14 DIAGNOSIS — R350 Frequency of micturition: Secondary | ICD-10-CM

## 2023-05-14 DIAGNOSIS — Z823 Family history of stroke: Secondary | ICD-10-CM

## 2023-05-14 DIAGNOSIS — Z125 Encounter for screening for malignant neoplasm of prostate: Secondary | ICD-10-CM | POA: Diagnosis not present

## 2023-05-14 DIAGNOSIS — Z136 Encounter for screening for cardiovascular disorders: Secondary | ICD-10-CM | POA: Diagnosis not present

## 2023-05-14 DIAGNOSIS — Z111 Encounter for screening for respiratory tuberculosis: Secondary | ICD-10-CM | POA: Diagnosis not present

## 2023-05-14 DIAGNOSIS — R7303 Prediabetes: Secondary | ICD-10-CM

## 2023-05-14 DIAGNOSIS — N401 Enlarged prostate with lower urinary tract symptoms: Secondary | ICD-10-CM | POA: Diagnosis not present

## 2023-05-14 DIAGNOSIS — Z23 Encounter for immunization: Secondary | ICD-10-CM | POA: Diagnosis not present

## 2023-05-14 LAB — CBC WITH DIFFERENTIAL/PLATELET
Absolute Monocytes: 1007 cells/uL — ABNORMAL HIGH (ref 200–950)
Basophils Absolute: 58 cells/uL (ref 0–200)
Basophils Relative: 0.4 %
Eosinophils Absolute: 365 cells/uL (ref 15–500)
Eosinophils Relative: 2.5 %
HCT: 45.9 % (ref 38.5–50.0)
Hemoglobin: 15.4 g/dL (ref 13.2–17.1)
Lymphs Abs: 3022 cells/uL (ref 850–3900)
MCH: 30.4 pg (ref 27.0–33.0)
MCHC: 33.6 g/dL (ref 32.0–36.0)
MCV: 90.5 fL (ref 80.0–100.0)
MPV: 10.2 fL (ref 7.5–12.5)
Monocytes Relative: 6.9 %
Neutro Abs: 10147 cells/uL — ABNORMAL HIGH (ref 1500–7800)
Neutrophils Relative %: 69.5 %
Platelets: 207 10*3/uL (ref 140–400)
RBC: 5.07 10*6/uL (ref 4.20–5.80)
RDW: 13.1 % (ref 11.0–15.0)
Total Lymphocyte: 20.7 %
WBC: 14.6 10*3/uL — ABNORMAL HIGH (ref 3.8–10.8)

## 2023-05-15 LAB — COMPLETE METABOLIC PANEL WITH GFR
AG Ratio: 1.7 (calc) (ref 1.0–2.5)
ALT: 19 U/L (ref 9–46)
AST: 22 U/L (ref 10–35)
Albumin: 4.5 g/dL (ref 3.6–5.1)
Alkaline phosphatase (APISO): 117 U/L (ref 35–144)
BUN: 14 mg/dL (ref 7–25)
CO2: 26 mmol/L (ref 20–32)
Calcium: 9.4 mg/dL (ref 8.6–10.3)
Chloride: 102 mmol/L (ref 98–110)
Creat: 1.04 mg/dL (ref 0.70–1.35)
Globulin: 2.7 g/dL (ref 1.9–3.7)
Glucose, Bld: 96 mg/dL (ref 65–99)
Potassium: 4.2 mmol/L (ref 3.5–5.3)
Sodium: 137 mmol/L (ref 135–146)
Total Bilirubin: 0.8 mg/dL (ref 0.2–1.2)
Total Protein: 7.2 g/dL (ref 6.1–8.1)
eGFR: 81 mL/min/{1.73_m2} (ref >=60)

## 2023-05-15 LAB — LIPID PANEL
Cholesterol: 116 mg/dL (ref <200)
HDL: 31 mg/dL — ABNORMAL LOW (ref >=40)
LDL Cholesterol (Calc): 56 mg/dL
Non-HDL Cholesterol (Calc): 85 mg/dL (ref <130)
Total CHOL/HDL Ratio: 3.7 (calc) (ref <5.0)
Triglycerides: 220 mg/dL — ABNORMAL HIGH (ref <150)

## 2023-05-15 LAB — IRON,TIBC AND FERRITIN PANEL
%SAT: 40 % (ref 20–48)
Ferritin: 201 ng/mL (ref 24–380)
Iron: 156 ug/dL (ref 50–180)
TIBC: 386 ug/dL (ref 250–425)

## 2023-05-15 LAB — TESTOSTERONE: Testosterone: 482 ng/dL (ref 250–827)

## 2023-05-15 LAB — HEMOGLOBIN A1C
Hgb A1c MFr Bld: 5.7 %{Hb} — ABNORMAL HIGH (ref <5.7)
Mean Plasma Glucose: 117 mg/dL
eAG (mmol/L): 6.5 mmol/L

## 2023-05-15 LAB — INSULIN, RANDOM: Insulin: 22.2 u[IU]/mL — ABNORMAL HIGH

## 2023-05-15 LAB — URINALYSIS, ROUTINE W REFLEX MICROSCOPIC
Bilirubin Urine: NEGATIVE
Glucose, UA: NEGATIVE
Hgb urine dipstick: NEGATIVE
Ketones, ur: NEGATIVE
Leukocytes,Ua: NEGATIVE
Nitrite: NEGATIVE
Protein, ur: NEGATIVE
Specific Gravity, Urine: 1.012 (ref 1.001–1.035)
pH: 7 (ref 5.0–8.0)

## 2023-05-15 LAB — MICROALBUMIN / CREATININE URINE RATIO
Creatinine, Urine: 64 mg/dL (ref 20–320)
Microalb Creat Ratio: 5 mg/g{creat} (ref <30)
Microalb, Ur: 0.3 mg/dL

## 2023-05-15 LAB — PSA: PSA: 0.51 ng/mL (ref <=4.00)

## 2023-05-15 LAB — MAGNESIUM: Magnesium: 2.3 mg/dL (ref 1.5–2.5)

## 2023-05-15 LAB — VITAMIN D 25 HYDROXY (VIT D DEFICIENCY, FRACTURES): Vit D, 25-Hydroxy: 82 ng/mL (ref 30–100)

## 2023-05-15 LAB — VITAMIN B12: Vitamin B-12: 753 pg/mL (ref 200–1100)

## 2023-05-15 LAB — TSH: TSH: 1.36 m[IU]/L (ref 0.40–4.50)

## 2023-05-16 ENCOUNTER — Other Ambulatory Visit: Payer: Self-pay | Admitting: Internal Medicine

## 2023-05-16 DIAGNOSIS — J40 Bronchitis, not specified as acute or chronic: Secondary | ICD-10-CM

## 2023-05-17 ENCOUNTER — Encounter: Payer: Self-pay | Admitting: Internal Medicine

## 2023-05-17 NOTE — Progress Notes (Signed)
<>*<>*<>*<>*<>*<>*<>*<>*<>*<>*<>*<>*<>*<>*<>*<>*<>*<>*<>*<>*<>*<>*<>*<>*<> <>*<>*<>*<>*<>*<>*<>*<>*<>*<>*<>*<>*<>*<>*<>*<>*<>*<>*<>*<>*<>*<>*<>*<>*<>  -   Hgb  ( red cell count ) is 15.5 gm%  and   WBC is slightly elevated   <>*<>*<>*<>*<>*<>*<>*<>*<>*<>*<>*<>*<>*<>*<>*<>*<>*<>*<>*<>*<>*<>*<>*<>*<> <>*<>*<>*<>*<>*<>*<>*<>*<>*<>*<>*<>*<>*<>*<>*<>*<>*<>*<>*<>*<>*<>*<>*<>*<>  -  Total Chol = 116  Excellent   - Very low risk for Heart Attack  / Stroke  <>*<>*<>*<>*<>*<>*<>*<>*<>*<>*<>*<>*<>*<>*<>*<>*<>*<>*<>*<>*<>*<>*<>*<>*<> <>*<>*<>*<>*<>*<>*<>*<>*<>*<>*<>*<>*<>*<>*<>*<>*<>*<>*<>*<>*<>*<>*<>*<>*<>  -  A1c   (= 12 week average blood sugar) is still borderline elevated   -    It is very important that you work harder with diet by                            avoiding all foods that are white except chicken, fish & calliflower.  - Avoid white rice  (brown & wild rice is OK),   - Avoid white potatoes  (sweet potatoes in moderation is OK),   White bread or wheat bread or anything made out of   white flour like bagels, donuts, rolls, buns, biscuits, cakes,  - pastries, cookies, pizza crust, and pasta (made from  white flour & egg whites)   - vegetarian pasta or spinach or wheat pasta is OK.  - Multigrain breads like Arnold's, Pepperidge Farm or                                                multigrain sandwich thins or high fiber breads like   Eureka bread or "Dave's Killer" breads that are  4 to 5 grams fiber per slice !  are best.    Diet, exercise and weight loss can reverse and cure  diabetes in the early stages.    <>*<>*<>*<>*<>*<>*<>*<>*<>*<>*<>*<>*<>*<>*<>*<>*<>*<>*<>*<>*<>*<>*<>*<>*<> <>*<>*<>*<>*<>*<>*<>*<>*<>*<>*<>*<>*<>*<>*<>*<>*<>*<>*<>*<>*<>*<>*<>*<>*<>  -  Vitamin B12 is high Normal - Wonderful !   <>*<>*<>*<>*<>*<>*<>*<>*<>*<>*<>*<>*<>*<>*<>*<>*<>*<>*<>*<>*<>*<>*<>*<>*<> <>*<>*<>*<>*<>*<>*<>*<>*<>*<>*<>*<>*<>*<>*<>*<>*<>*<>*<>*<>*<>*<>*<>*<>*<>  -  PSA - very  low - No Prostate Cancer - Great !   <>*<>*<>*<>*<>*<>*<>*<>*<>*<>*<>*<>*<>*<>*<>*<>*<>*<>*<>*<>*<>*<>*<>*<>*<> <>*<>*<>*<>*<>*<>*<>*<>*<>*<>*<>*<>*<>*<>*<>*<>*<>*<>*<>*<>*<>*<>*<>*<>*<>  -  Testosterone - Normal   <>*<>*<>*<>*<>*<>*<>*<>*<>*<>*<>*<>*<>*<>*<>*<>*<>*<>*<>*<>*<>*<>*<>*<>*<> <>*<>*<>*<>*<>*<>*<>*<>*<>*<>*<>*<>*<>*<>*<>*<>*<>*<>*<>*<>*<>*<>*<>*<>*<>  -  Vitamin D = 82 - Excellent  !   <>*<>*<>*<>*<>*<>*<>*<>*<>*<>*<>*<>*<>*<>*<>*<>*<>*<>*<>*<>*<>*<>*<>*<>*<> <>*<>*<>*<>*<>*<>*<>*<>*<>*<>*<>*<>*<>*<>*<>*<>*<>*<>*<>*<>*<>*<>*<>*<>*<>  -

## 2023-05-18 ENCOUNTER — Other Ambulatory Visit: Payer: Self-pay | Admitting: Internal Medicine

## 2023-05-18 DIAGNOSIS — D72829 Elevated white blood cell count, unspecified: Secondary | ICD-10-CM

## 2023-06-01 ENCOUNTER — Encounter: Payer: Self-pay | Admitting: Internal Medicine

## 2023-07-13 ENCOUNTER — Other Ambulatory Visit: Payer: Self-pay | Admitting: Nurse Practitioner

## 2023-07-13 DIAGNOSIS — M2550 Pain in unspecified joint: Secondary | ICD-10-CM

## 2023-08-02 NOTE — Progress Notes (Signed)
[place]      ADULT   &   ADOLESCENT      INTERNAL MEDICINE  Lucky Cowboy, M.D.          Rance Muir, ANP        Adela Glimpse, FNP  [facility] [address]  [place], [place]. [address] Telephone [phone] Telefax [phone]  Future Appointments  Date Time Provider Department  05/14/2023                 cpe          08/03/2023  3:30 PM Lucky Cowboy, MD [facility]  08/24/2023                 3 mo ov  9:30 AM Adela Glimpse, NP [facility]  09/28/2023 11:00 AM Shawnie Dapper, NP [facility]  11/23/2023                 6 mo ov  9:30 AM Lucky Cowboy, MD [facility]  02/25/2024                 9 mo ov  9:30 AM Adela Glimpse, NP [facility]  05/30/2024                  cpe 10:00 AM Lucky Cowboy, MD [facility]    History of Present Illness:       This very nice 62 y.o. [name]  with HTN, HLD, Pre-Diabetes,  OSA/CPAP and Vitamin D Deficiency  presents for  c/o sinus & Rt ear pressure  . Also has been dealing with Insomnia & has use old Rx of Trazodone                       Patient is monitored expectantly  for labile HTN  since 2012 & BP has been controlled at home. Today's BP is at goal - 134/70. Patient has had no complaints of any cardiac type chest pain, palpitations, dyspnea Pollyann Kennedy /PND, dizziness, claudication or dependent edema.        Hyperlipidemia is controlled with diet & Rosuvastatin . Patient denies myalgias or other med SE's. Last Lipids were at goal except elevated Trig's :  Lab Results  Component Value Date   CHOL 116 05/14/2023   HDL 31 (L) 05/14/2023   LDLCALC 56 05/14/2023   TRIG 220 (H) 05/14/2023   CHOLHDL 3.7 05/14/2023     Also, the patient has history of PreDiabetes (A1c 5.9% /2012)    and has had no symptoms of reactive hypoglycemia, diabetic polys, paresthesias or visual blurring.  Last A1c was near  goal:  Lab Results  Component Value Date   HGBA1C 5.7 (H) 05/14/2023                                                         Further, the patient also has history of Vitamin D Deficiency (A1c 5.9% /2012)  and supplements vitamin D . Last vitamin D was at goal :  Lab Results  Component Value Date   VD25OH 82 05/14/2023       Current Outpatient Medications  Medication Instructions   ASPIRIN 81 mg Daily   VITAMIN D 10,000 Units Daily   econazole nitrate 1 % cream Daily   FLAXSEED OIL 1200 MG CAPS 1 capsule  Daily   FLONASE  nasal spray SPRAY 1 TO 2 SPRAYS IN EACH NOSTRIL 1 TO 2 TIMES DAILY   gabapentin 300 MG capsule TAKE 1 CAPSULE 3 x /day  AS NEEDED FOR NEUROPATHY PAINS   Magnesium 500 MG TABS Daily   Meloxicam   7.5 mg Daily   MultiVit-Minerals/ZINC25.5 mg Daily   Omega-3 FISH OIL 1200 MG CAPS 1 capsule  Daily   rosuvastatin  40 MG tablet TAKE 1 TABLET  EVERY DAY FOR CHOLESTEROL   sertraline  25 MG tablet TAKE 1 TABLET  AT BEDTIME       Allergies  Allergen Reactions   Niacin And Related Swelling    Red rash and swelling   Gemfibrozil    Penicillins Other (See Comments)    Unknown childhood allergy Received 2 Gms Ancef with no obvious reaction     PMHx:   Past Medical History:  Diagnosis Date   Acid reflux    Arthritis    Blood dyscrasia    ITP   Bruise 08/08/11   pt states brusing x 1 month   Chest pain 08/08/2011   "a little in last month"   Disorder of both ears 08/08/2011   blood in ears in past month - put on steroids, "tubes stopped up"   Fracture of left clavicle 02/08/2015   Hypercholesteremia    Hypertension, Labile 11/03/2013   no meds   Idiopathic thrombocytopenic purpura (ITP) (HCC)    Leg cramps 08/08/2011   bad in last 1.5 months   Morbid obesity (HCC)    Prediabetes    Sleep apnea    wears CPAP "sometimes"   Tick bites 08/08/11   60 to 70 ticks in last hunting season , last > 1 month     Immunization History  Administered Date(s) Administered   Influenza Inj Mdck Quad  05/19/2018   Influenza Split 05/24/2013, 06/22/2014, 04/18/2017    Influenza, Seasonal,  07/03/2015   Influenza,inj,Quad  07/06/2017, 05/10/2019, 06/04/2021   Influenza,inj,quad 08/07/2016   Influenza 05/19/2018, 06/25/2020   PFIZER SARS-COV-2 Vacc 11/01/2019, 11/22/2019, 06/25/2020   PPD Test 09/09/2017, 04/15/2021, 04/14/2022   Pneumococcal -23 05/19/2012   Td 08/18/2008, 02/04/2015     Past Surgical History:  Procedure Laterality Date   BACK SURGERY  07/2010   lumbar - 3 discs   FRACTURE SURGERY     right foot fracture, no surgery   LUMBAR DISC SURGERY  2011   ORIF CLAVICULAR FRACTURE Left 02/08/2015   Procedure: OPEN REDUCTION INTERNAL FIXATION (ORIF) LEFT CLAVICULAR FRACTURE;  Surgeon: Teryl Lucy, MD;  Location: [facility];  Service: Orthopedics;  Laterality: Left;     FHx:    Reviewed / unchanged   SHx:    Reviewed / unchanged    Systems Review:  Constitutional: Denies fever, chills, wt changes, headaches, insomnia, fatigue, night sweats, change in appetite. Eyes: Denies redness, blurred vision, diplopia, discharge, itchy, watery eyes.  ENT: Denies discharge, congestion, post nasal drip, epistaxis, sore throat, earache, hearing loss, dental pain, tinnitus, vertigo, sinus pain, snoring.  CV: Denies chest pain, palpitations, irregular heartbeat, syncope, dyspnea, diaphoresis, orthopnea, PND, claudication or edema. Respiratory: denies cough, dyspnea, DOE, pleurisy, hoarseness, laryngitis, wheezing.  Gastrointestinal: Denies dysphagia, odynophagia, heartburn, reflux, water brash, abdominal pain or cramps, nausea, vomiting, bloating, diarrhea, constipation, hematemesis, melena, hematochezia  or hemorrhoids. Genitourinary: Denies dysuria, frequency, urgency, nocturia, hesitancy, discharge, hematuria or flank pain. Musculoskeletal: Denies arthralgias, myalgias, stiffness, jt. swelling, pain, limping or strain/sprain.  Skin: Denies pruritus, rash,  hives, warts, acne, eczema or change in skin lesion(s). Neuro: No weakness,  tremor, incoordination, spasms, paresthesia or pain. Psychiatric: Denies confusion, memory loss or sensory loss. Endo: Denies change in weight, skin or hair change.  Heme/Lymph: No excessive bleeding, bruising or enlarged lymph nodes.   Physical Exam  BP 134/70   Pulse 71   Temp 97.9 F (36.6 C)   Resp 16   Ht 5' 9.5" (1.765 m)   Wt 232 lb 9.6 oz (105.5 kg)   SpO2 96%   BMI 33.86 kg/m   Appears  over nourished   and in no distress.  Eyes: PERRLA, EOMs, conjunctiva no swelling or erythema. Sinuses: No frontal/maxillary tenderness ENT/Mouth: EAC's clear,  Rt TM pink & retracted. Lt TM  nl w/o erythema, bulging. Nares clear w/o erythema, swelling, exudates. Oropharynx clear without erythema or exudates. Oral hygiene is good. Tongue normal, non obstructing. Hearing intact.  Neck: Supple. Thyroid not palpable. Car 2+/2+ without bruits, nodes or JVD. Chest: Respirations nl with BS clear & equal w/o rales, rhonchi, wheezing or stridor.  Cor: Heart sounds normal w/ regular rate and rhythm without sig. murmurs, gallops, clicks or rubs. Peripheral pulses normal and equal  without edema.  Abdomen: Soft & bowel sounds normal. Non-tender w/o guarding, rebound, hernias, masses or organomegaly.  Lymphatics: Unremarkable.  Musculoskeletal: Full ROM all peripheral extremities, joint stability, 5/5 strength and normal gait.  Skin: Warm, dry without exposed rashes, lesions or ecchymosis apparent.  Neuro: Cranial nerves intact, reflexes equal bilaterally. Sensory-motor testing grossly intact. Tendon reflexes grossly intact.  Pysch: Alert & oriented x 3.  Insight and judgement nl & appropriate. No ideations.   Assessment and Plan:  1. OM ( otitis media ), right (Primary)  - azithromycin  250 MG tablet;    Take 2 tablets with Food on  Day 1, then 1 tablet Daily    Dispense: 6 each; Refill: 1  - dexamethasone  2 MG tablet;    Take 1 tab 3 x day for 3 days, then 2 x day for 3 days, then 1 tab  daily     Dispense: 20 tablet; Refill: 0  - pseudoephedrine  120 MG 12 hr tablet;   Take   1 tablet    2 x /day (every 12 hours)   Congestion     Dispense: 60 tablet; Refill: 3  2. Primary insomnia  - trazodone   300 MG tablet;  Take  1/2 to 1 tablet   1 to 2 hours  before Bedtime as Needed for Sleep   Dispense: 90 tablet; Refill: 1          Discussed  regular exercise, BP monitoring, weight control to achieve/maintain BMI less than 25 and discussed med and SE's. Recommended labs to assess /monitor clinical status .  I discussed the assessment and treatment plan with the patient. The patient was provided an opportunity to ask questions and all were answered. The patient agreed with the plan and demonstrated an understanding of the instructions.  I provided over 30 minutes of exam, counseling, chart review and  complex critical decision making.        The patient was advised to call back or seek an in-person evaluation if the symptoms worsen or if the condition fails to improve as anticipated.   Marinus Maw, MD

## 2023-08-02 NOTE — Progress Notes (Incomplete)
[name]      ADULT   &   ADOLESCENT      INTERNAL MEDICINE  Lucky Cowboy, M.D.          Rance Muir, ANP        Adela Glimpse, FNP  [facility] [address]  [place], [place]. [address] Telephone [phone] Telefax [phone]  Future Appointments  Date Time Provider Department  05/14/2023          08/03/2023  3:30 PM Lucky Cowboy, MD [facility]  08/24/2023                3 mo ov  9:30 AM Adela Glimpse, NP [facility]  09/28/2023 11:00 AM Shawnie Dapper, NP [facility]  11/23/2023                6 mo ov  9:30 AM Lucky Cowboy, MD [facility]  02/25/2024                9 mo ov  9:30 AM Adela Glimpse, NP [facility]  05/30/2024                 cpe 10:00 AM Lucky Cowboy, MD [facility]    History of Present Illness:       This very nice 62 y.o. [name]  with HTN, HLD, Pre-Diabetes,  OSA/CPAP and Vitamin D Deficiency  presents for                         Patient is monitored expectantly  for labile HTN  since 2012 & BP has been controlled at home. Today's BP is at goal - 136/80. Patient has had no complaints of any cardiac type chest pain, palpitations, dyspnea Pollyann Kennedy /PND, dizziness, claudication or dependent edema.        Hyperlipidemia is controlled with diet & Rosuvastatin . Patient denies myalgias or other med SE's. Last Lipids were at goal except elevated Trig's :  Lab Results  Component Value Date   CHOL 116 05/14/2023   HDL 31 (L) 05/14/2023   LDLCALC 56 05/14/2023   TRIG 220 (H) 05/14/2023   CHOLHDL 3.7 05/14/2023     Also, the patient has history of PreDiabetes (A1c 5.9% /2012)    and has had no symptoms of reactive hypoglycemia, diabetic polys, paresthesias or visual blurring.  Last A1c was near  goal:  Lab Results  Component Value Date   HGBA1C 5.7 (H) 05/14/2023                                                        Further, the patient also has history of Vitamin D Deficiency (A1c 5.9% /2012)  and supplements  vitamin D . Last vitamin D was at goal :  Lab Results  Component Value Date   VD25OH 82 05/14/2023       Current Outpatient Medications  Medication Instructions  . ASPIRIN 81 mg Daily  . VITAMIN D 10,000 Units Daily  . econazole nitrate 1 % cream Daily  . FLAXSEED OIL 1200 MG CAPS 1 capsule   Daily  . FLONASE  nasal spray SPRAY 1 TO 2 SPRAYS IN EACH NOSTRIL 1 TO 2 TIMES DAILY  . gabapentin 300 MG capsule TAKE 1 CAPSULE 3 x /day  AS NEEDED FOR NEUROPATHY PAINS  .  Magnesium 500 MG TABS Daily  . Meloxicam   7.5 mg Daily  . MultiVit-Minerals/ZINC25.5 mg Daily  . Omega-3 FISH OIL 1200 MG CAPS 1 capsule  Daily  . rosuvastatin  40 MG tablet TAKE 1 TABLET  EVERY DAY FOR CHOLESTEROL  . sertraline  25 MG tablet TAKE 1 TABLET  AT BEDTIME       Allergies  Allergen Reactions  . Niacin And Related Swelling    Red rash and swelling  . Gemfibrozil   . Penicillins Other (See Comments)    Unknown childhood allergy Received 2 Gms Ancef with no obvious reaction     PMHx:   Past Medical History:  Diagnosis Date  . Acid reflux   . Arthritis   . Blood dyscrasia    ITP  . Bruise 08/08/11   pt states brusing x 1 month  . Chest pain 08/08/2011   "a little in last month"  . 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 Fracture of left clavicle 02/08/2015  . Hypercholesteremia   . Hypertension, Labile 11/03/2013   no meds  . Idiopathic thrombocytopenic purpura (ITP) (HCC)   . Leg cramps 08/08/2011   bad in last 1.5 months  . Morbid obesity (HCC)   . Prediabetes   . Sleep apnea    wears CPAP "sometimes"  . Tick bites 08/08/11   60 to 70 ticks in last hunting season , last > 1 month     Immunization History  Administered Date(s) Administered  . Influenza Inj Mdck Quad  05/19/2018  . Influenza Split 05/24/2013, 06/22/2014, 04/18/2017  . Influenza, Seasonal,  07/03/2015  . Influenza,inj,Quad  07/06/2017, 05/10/2019,  06/04/2021  . Influenza,inj,quad 08/07/2016  . Influenza 05/19/2018, 06/25/2020  . PFIZER SARS-COV-2 Vacc 11/01/2019, 11/22/2019, 06/25/2020  . PPD Test 09/09/2017, 04/15/2021, 04/14/2022  . Pneumococcal -23 05/19/2012  . Td 08/18/2008, 02/04/2015     Past Surgical History:  Procedure Laterality Date  . BACK SURGERY  07/2010   lumbar - 3 discs  . FRACTURE SURGERY     right foot fracture, no surgery  . LUMBAR DISC SURGERY  2011  . ORIF CLAVICULAR FRACTURE Left 02/08/2015   Procedure: OPEN REDUCTION INTERNAL FIXATION (ORIF) LEFT CLAVICULAR FRACTURE;  Surgeon: Teryl Lucy, MD;  Location: [facility];  Service: Orthopedics;  Laterality: Left;     FHx:    Reviewed / unchanged   SHx:    Reviewed / unchanged    Systems Review:  Constitutional: Denies fever, chills, wt changes, headaches, insomnia, fatigue, night sweats, change in appetite. Eyes: Denies redness, blurred vision, diplopia, discharge, itchy, watery eyes.  ENT: Denies discharge, congestion, post nasal drip, epistaxis, sore throat, earache, hearing loss, dental pain, tinnitus, vertigo, sinus pain, snoring.  CV: Denies chest pain, palpitations, irregular heartbeat, syncope, dyspnea, diaphoresis, orthopnea, PND, claudication or edema. Respiratory: denies cough, dyspnea, DOE, pleurisy, hoarseness, laryngitis, wheezing.  Gastrointestinal: Denies dysphagia, odynophagia, heartburn, reflux, water brash, abdominal pain or cramps, nausea, vomiting, bloating, diarrhea, constipation, hematemesis, melena, hematochezia  or hemorrhoids. Genitourinary: Denies dysuria, frequency, urgency, nocturia, hesitancy, discharge, hematuria or flank pain. Musculoskeletal: Denies arthralgias, myalgias, stiffness, jt. swelling, pain, limping or strain/sprain.  Skin: Denies pruritus, rash, hives, warts, acne, eczema or change in skin lesion(s). Neuro: No weakness, tremor, incoordination, spasms, paresthesia or pain. Psychiatric: Denies  confusion, memory loss or sensory loss. Endo: Denies change in weight, skin or hair change.  Heme/Lymph: No excessive  bleeding, bruising or enlarged lymph nodes.   Physical Exam  There were no vitals taken for this visit.  Appears  over nourished   and in no distress.  Eyes: PERRLA, EOMs, conjunctiva no swelling or erythema. Sinuses: No frontal/maxillary tenderness ENT/Mouth: EAC's clear, TM's nl w/o erythema, bulging. Nares clear w/o erythema, swelling, exudates. Oropharynx clear without erythema or exudates. Oral hygiene is good. Tongue normal, non obstructing. Hearing intact.  Neck: Supple. Thyroid not palpable. Car 2+/2+ without bruits, nodes or JVD. Chest: Respirations nl with BS clear & equal w/o rales, rhonchi, wheezing or stridor.  Cor: Heart sounds normal w/ regular rate and rhythm without sig. murmurs, gallops, clicks or rubs. Peripheral pulses normal and equal  without edema.  Abdomen: Soft & bowel sounds normal. Non-tender w/o guarding, rebound, hernias, masses or organomegaly.  Lymphatics: Unremarkable.  Musculoskeletal: Full ROM all peripheral extremities, joint stability, 5/5 strength and normal gait.  Skin: Warm, dry without exposed rashes, lesions or ecchymosis apparent.  Neuro: Cranial nerves intact, reflexes equal bilaterally. Sensory-motor testing grossly intact. Tendon reflexes grossly intact.  Pysch: Alert & oriented x 3.  Insight and judgement nl & appropriate. No ideations.   Assessment and Plan:  1. Labile hypertension  - Continue medication, monitor blood pressure at home.  - Continue DASH diet.  Reminder to go to the ER if any CP,  SOB, nausea, dizziness, severe HA, changes vision/speech.    - CBC with Differential/Platelet - COMPLETE METABOLIC PANEL WITH GFR - Magnesium - TSH  2. Hyperlipidemia, mixed  - Continue diet/meds, exercise,& lifestyle modifications.  - Continue monitor periodic cholesterol/liver & renal functions      - Lipid panel -  TSH  3. Abnormal glucose  - Continue diet, exercise  - Lifestyle modifications.  - Monitor appropriate labs  - Continue supplementation    - Hemoglobin A1c - Insulin, random  4. Vitamin D deficiency  - VITAMIN D 25 Hydroxy 5. Medication management  - CBC with Differential/Platelet - COMPLETE METABOLIC PANEL WITH GFR - Magnesium - Lipid panel - TSH - Hemoglobin A1c - Insulin, random - VITAMIN D 25 Hydroxy          Discussed  regular exercise, BP monitoring, weight control to achieve/maintain BMI less than 25 and discussed med and SE's. Recommended labs to assess /monitor clinical status .  I discussed the assessment and treatment plan with the patient. The patient was provided an opportunity to ask questions and all were answered. The patient agreed with the plan and demonstrated an understanding of the instructions.  I provided over 30 minutes of exam, counseling, chart review and  complex critical decision making.        The patient was advised to call back or seek an in-person evaluation if the symptoms worsen or if the condition fails to improve as anticipated.   Marinus Maw, MD

## 2023-08-03 ENCOUNTER — Ambulatory Visit (INDEPENDENT_AMBULATORY_CARE_PROVIDER_SITE_OTHER): Payer: Commercial Managed Care - HMO | Admitting: Internal Medicine

## 2023-08-03 VITALS — BP 134/70 | HR 71 | Temp 97.9°F | Resp 16 | Ht 69.5 in | Wt 232.6 lb

## 2023-08-03 DIAGNOSIS — F5101 Primary insomnia: Secondary | ICD-10-CM

## 2023-08-03 DIAGNOSIS — H6591 Unspecified nonsuppurative otitis media, right ear: Secondary | ICD-10-CM | POA: Diagnosis not present

## 2023-08-03 MED ORDER — AZITHROMYCIN 250 MG PO TABS: ORAL_TABLET | ORAL | 1 refills | Status: DC

## 2023-08-03 MED ORDER — PSEUDOEPHEDRINE HCL ER 120 MG PO TB12
ORAL_TABLET | ORAL | 3 refills | Status: DC
Start: 2023-08-03 — End: 2023-10-01

## 2023-08-03 MED ORDER — TRAZODONE HCL 300 MG PO TABS: ORAL_TABLET | ORAL | 1 refills | Status: DC

## 2023-08-03 MED ORDER — DEXAMETHASONE 2 MG PO TABS
ORAL_TABLET | ORAL | 0 refills | Status: DC
Start: 2023-08-03 — End: 2023-10-01

## 2023-08-03 NOTE — Patient Instructions (Signed)

## 2023-08-24 ENCOUNTER — Ambulatory Visit: Payer: Commercial Managed Care - HMO | Admitting: Nurse Practitioner
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
## 2023-09-28 ENCOUNTER — Ambulatory Visit: Payer: Commercial Managed Care - HMO | Admitting: Family Medicine

## 2023-09-28 DIAGNOSIS — G4733 Obstructive sleep apnea (adult) (pediatric): Secondary | ICD-10-CM

## 2023-09-29 ENCOUNTER — Encounter: Payer: Self-pay | Admitting: *Deleted

## 2023-10-01 ENCOUNTER — Ambulatory Visit: Payer: Medicaid Other | Admitting: Nurse Practitioner

## 2023-10-01 ENCOUNTER — Encounter: Payer: Self-pay | Admitting: Nurse Practitioner

## 2023-10-01 VITALS — BP 120/78 | HR 60 | Temp 97.6°F | Ht 68.0 in | Wt 230.0 lb

## 2023-10-01 DIAGNOSIS — Z862 Personal history of diseases of the blood and blood-forming organs and certain disorders involving the immune mechanism: Secondary | ICD-10-CM

## 2023-10-01 DIAGNOSIS — E782 Mixed hyperlipidemia: Secondary | ICD-10-CM | POA: Diagnosis not present

## 2023-10-01 DIAGNOSIS — I10 Essential (primary) hypertension: Secondary | ICD-10-CM

## 2023-10-01 DIAGNOSIS — R7303 Prediabetes: Secondary | ICD-10-CM | POA: Diagnosis not present

## 2023-10-01 DIAGNOSIS — G4733 Obstructive sleep apnea (adult) (pediatric): Secondary | ICD-10-CM

## 2023-10-01 DIAGNOSIS — E669 Obesity, unspecified: Secondary | ICD-10-CM | POA: Diagnosis not present

## 2023-10-01 DIAGNOSIS — Z7689 Persons encountering health services in other specified circumstances: Secondary | ICD-10-CM

## 2023-10-01 NOTE — Assessment & Plan Note (Signed)
History of the same patient currently maintained on rosuvastatin 40 mg daily.  Last LDL within goal

## 2023-10-01 NOTE — Progress Notes (Signed)
New Patient Office Visit  Subjective    Patient ID: [name], male    DOB: 23-Jul-1961  Age: 63 y.o. MRN: [id number]  CC:  Chief Complaint  Patient presents with   Establish Care    General check up and lab work. Discuss Prevnar     HPI [name] presents to establish care   Neuropathy: 300mg  TID as needed. For foot pain and trunk pain   OSA: hx of CPAP use but not currenlty using it. Was seeing neurology. States that he did sleep worse with the CPAP. He use to be heavier and   HTN: Historical diagnosis patient currently maintained on lifestyle notifications only  Insomnia: he will take 150mg  to 300mg  at beditme. That works. He has tried several   PTSD: States this was the diagnosis given by sleep neurologist.  He was referred to psychiatry but his insurance changed and they did accept his insurance anymore.  Patient was placed on sertraline 25 mg states it caused him to "have vivid dreams all night" patient does not want to be referred to psychiatry at this juncture  Colonoscopy: 01/2022 with 5 year recall PSA: UTD done 05/14/2023  Tdap: 2016 Flu: Up-to-date 05/14/2023 Covid: Original series and boosters Pna: Up-to-date repeat at age 60 shingles: Discussed in office get local pharmacy  Outpatient Encounter Medications as of 10/01/2023  Medication Sig   ASPIRIN 81 PO Take by mouth daily.   Cholecalciferol (VITAMIN D PO) Take 10,000 Int'l Units by mouth daily.    econazole nitrate 1 % cream Apply topically daily.   Flaxseed, Linseed, (FLAXSEED OIL) 1200 MG CAPS Take 1 capsule by mouth daily.    fluticasone (FLONASE) 50 MCG/ACT nasal spray SPRAY 1 TO 2 SPRAYS IN EACH NOSTRIL 1 TO 2 TIMES DAILY   gabapentin (NEURONTIN) 300 MG capsule TAKE 1 CAPSULE 3 TIMES DAILY AS NEEDED FOR NEUROPATHY PAINS   Magnesium 500 MG TABS Take by mouth daily.   meloxicam (MOBIC) 7.5 MG tablet TAKE 1 TABLET BY MOUTH EVERY DAY   Multiple Vitamins-Minerals (ZINC PO) Take 25.5 mg by mouth  daily.   Omega-3 Fatty Acids (FISH OIL) 1200 MG CAPS Take 1 capsule by mouth daily.    rosuvastatin (CRESTOR) 40 MG tablet TAKE 1 TABLET BY MOUTH EVERY DAY FOR CHOLESTEROL   trazodone (DESYREL) 300 MG tablet Take  1/2 to 1 tablet   1 to 2 hours  before Bedtime as Needed for Sleep   [DISCONTINUED] azithromycin (ZITHROMAX) 250 MG tablet Take 2 tablets with Food on  Day 1, then 1 tablet Daily with Food for Sinusitis / Bronchitis   [DISCONTINUED] dexamethasone (DECADRON) 2 MG tablet Take 1 tab 3 x day for 3 days, then 2 x day for 3 days, then 1 tab daily   [DISCONTINUED] pseudoephedrine (SUDAFED) 120 MG 12 hr tablet Take   1 tablet    2 x /day (every 12 hours)    for Sinus &  Ear   Congestion   [DISCONTINUED] sertraline (ZOLOFT) 25 MG tablet TAKE 1 TABLET BY MOUTH EVERYDAY AT BEDTIME (Patient not taking: Reported on 10/01/2023)   No facility-administered encounter medications on file as of 10/01/2023.    Past Medical History:  Diagnosis Date   Acid reflux    Arthritis    Blood dyscrasia    ITP   Bruise 08/08/11   pt states brusing x 1 month   Chest pain 08/08/2011   "a little in last month"   Disorder of both  ears 08/08/2011   blood in ears in past month - put on steroids, "tubes stopped up"   Fracture of left clavicle 02/08/2015   Hypercholesteremia    Hypertension, Labile 11/03/2013   no meds   Idiopathic thrombocytopenic purpura (ITP) (HCC)    Leg cramps 08/08/2011   bad in last 1.5 months   Morbid obesity (HCC)    Prediabetes    Sleep apnea    wears CPAP "sometimes"   Tick bites 08/08/11   60 to 70 ticks in last hunting season , last > 1 month    Past Surgical History:  Procedure Laterality Date   BACK SURGERY  07/2010   lumbar - 3 discs   FRACTURE SURGERY     right foot fracture, no surgery   LUMBAR DISC SURGERY  2011   ORIF CLAVICULAR FRACTURE Left 02/08/2015   Procedure: OPEN REDUCTION INTERNAL FIXATION (ORIF) LEFT CLAVICULAR FRACTURE;  Surgeon: Teryl Lucy, MD;   [facility]:  [facility];  Service: Orthopedics;  Laterality: Left;    Family History  Problem Relation Age of Onset   Uterine cancer Mother    Diabetes Sister    Uterine cancer Sister    Depression Maternal Grandmother    Depression Paternal Grandmother    Stroke Neg Hx     Social History   Socioeconomic History   Marital status: Single    Spouse name: Not on file   Number of children: 0   Years of education: 10   Highest education level: Not on file  Occupational History   Occupation: [employer]   Tobacco Use   Smoking status: Former    Current packs/day: 0.00    Average packs/day: 1 pack/day for 8.0 years (8.0 ttl pk-yrs)    Types: Cigarettes    Start date: 01/03/2007    Quit date: 01/03/2015    Years since quitting: 8.7   Smokeless tobacco: Former    Types: Chew   Tobacco comments:    smokes 1-2 cigarettes daily  Vaping Use   Vaping status: Never Used  Substance and Sexual Activity   Alcohol use: Not Currently    Comment: drank 12 beers last night on 08/07/11, pt states rarely drinks   Drug use: No    Comment: occasionally chews tobacco   Sexual activity: Yes  Other Topics Concern   Not on file  Social History Narrative   Lives alone   Caffeine use: Coffee daily   Decaf tea   Social Drivers of Corporate investment banker Strain: Not on file  Food Insecurity: Not on file  Transportation Needs: Not on file  Physical Activity: Not on file  Stress: Not on file  Social Connections: Not on file  Intimate Partner Violence: Not on file    Review of Systems  Constitutional:  Negative for chills and fever.  Respiratory:  Negative for shortness of breath.   Cardiovascular:  Negative for chest pain and leg swelling.  Gastrointestinal:  Negative for abdominal pain, blood in stool, constipation, diarrhea, nausea and vomiting.       BM daily   Genitourinary:  Negative for dysuria and hematuria.  Neurological:  Negative for tingling and headaches.   Psychiatric/Behavioral:  Negative for hallucinations and suicidal ideas. The patient has insomnia.         Objective    BP 120/78   Pulse 60   Temp 97.6 F (36.4 C) (Oral)   Ht 5\' 8"  (1.727 m)   Wt 230 lb (104.3  kg)   SpO2 98%   BMI 34.97 kg/m   Physical Exam Vitals and nursing note reviewed.  Constitutional:      Appearance: Normal appearance.  HENT:     Right Ear: Tympanic membrane, ear canal and external ear normal.     Left Ear: Tympanic membrane, ear canal and external ear normal.     Mouth/Throat:     Mouth: Mucous membranes are moist.     Pharynx: Oropharynx is clear.  Eyes:     Extraocular Movements: Extraocular movements intact.     Pupils: Pupils are equal, round, and reactive to light.  Cardiovascular:     Rate and Rhythm: Normal rate and regular rhythm.     Pulses: Normal pulses.     Heart sounds: Normal heart sounds.  Pulmonary:     Effort: Pulmonary effort is normal.     Breath sounds: Normal breath sounds.  Musculoskeletal:     Right lower leg: No edema.     Left lower leg: No edema.  Lymphadenopathy:     Cervical: No cervical adenopathy.  Skin:    General: Skin is warm.  Neurological:     General: No focal deficit present.     Mental Status: He is alert.     Deep Tendon Reflexes:     Reflex Scores:      Bicep reflexes are 2+ on the right side and 2+ on the left side.      Patellar reflexes are 2+ on the right side and 2+ on the left side.    Comments: Bilateral upper and lower extremity strength 5/5  Psychiatric:        Mood and Affect: Mood normal.        Behavior: Behavior normal.        Thought Content: Thought content normal.        Judgment: Judgment normal.         Assessment & Plan:   Problem List Items Addressed This Visit       Cardiovascular and Mediastinum   Essential hypertension   Historical diagnosis currently maintained on lifestyle modifications only      Relevant Orders   CBC   Comprehensive metabolic panel      Respiratory   OSA (obstructive sleep apnea)   History of the same.  Patient does have a CPAP at home but has not used as of late.  States he needs it more when he was heavier.  Was post to be followed by neurology but has not followed up        Other   Hyperlipidemia, mixed   History of the same patient currently maintained on rosuvastatin 40 mg daily.  Last LDL within goal      Relevant Orders   Comprehensive metabolic panel   Hemoglobin A1c   Prediabetes   History of the same.  Continue work on lifestyle applications.  Pending A1c today      Relevant Orders   Comprehensive metabolic panel   Hemoglobin A1c   Establishing care with new doctor, encounter for   Patient switching provider offices due to insurance.  EMR reviewed      History of ITP - Primary   History of same was followed by hematology.  Has been in remission.  Pending labs today      Relevant Orders   CBC   Comprehensive metabolic panel   Obesity (BMI 16-10.9)   Relevant Orders   Hemoglobin A1c    Return in about  6 months (around 03/30/2024) for CPE and Labs.   Audria Nine, NP

## 2023-10-01 NOTE — Assessment & Plan Note (Signed)
Patient switching provider offices due to insurance.  EMR reviewed

## 2023-10-01 NOTE — Assessment & Plan Note (Signed)
History of same was followed by hematology.  Has been in remission.  Pending labs today

## 2023-10-01 NOTE — Patient Instructions (Signed)
Nice to see you today I will be in touch with the labs once I have them Follow up with me in 6 months. We can always do the labs in between

## 2023-10-01 NOTE — Assessment & Plan Note (Signed)
History of the same.  Continue work on lifestyle applications.  Pending A1c today

## 2023-10-01 NOTE — Assessment & Plan Note (Signed)
Historical diagnosis currently maintained on lifestyle modifications only

## 2023-10-01 NOTE — Assessment & Plan Note (Signed)
History of the same.  Patient does have a CPAP at home but has not used as of late.  States he needs it more when he was heavier.  Was post to be followed by neurology but has not followed up

## 2023-10-02 LAB — COMPREHENSIVE METABOLIC PANEL
ALT: 19 U/L (ref 0–53)
AST: 17 U/L (ref 0–37)
Albumin: 4.4 g/dL (ref 3.5–5.2)
Alkaline Phosphatase: 97 U/L (ref 39–117)
BUN: 10 mg/dL (ref 6–23)
CO2: 27 meq/L (ref 19–32)
Calcium: 9.1 mg/dL (ref 8.4–10.5)
Chloride: 101 meq/L (ref 96–112)
Creatinine, Ser: 0.97 mg/dL (ref 0.40–1.50)
GFR: 83.17 mL/min (ref >60.00)
Glucose, Bld: 90 mg/dL (ref 70–99)
Potassium: 4.2 meq/L (ref 3.5–5.1)
Sodium: 137 meq/L (ref 135–145)
Total Bilirubin: 0.5 mg/dL (ref 0.2–1.2)
Total Protein: 7 g/dL (ref 6.0–8.3)

## 2023-10-02 LAB — CBC
HCT: 44.8 % (ref 39.0–52.0)
Hemoglobin: 15 g/dL (ref 13.0–17.0)
MCHC: 33.5 g/dL (ref 30.0–36.0)
MCV: 89 fl (ref 78.0–100.0)
Platelets: 217 10*3/uL (ref 150.0–400.0)
RBC: 5.03 Mil/uL (ref 4.22–5.81)
RDW: 13.6 % (ref 11.5–15.5)
WBC: 9.5 10*3/uL (ref 4.0–10.5)

## 2023-10-02 LAB — HEMOGLOBIN A1C: Hgb A1c MFr Bld: 6.2 % (ref 4.6–6.5)

## 2023-10-06 ENCOUNTER — Telehealth: Payer: Self-pay

## 2023-10-06 NOTE — Telephone Encounter (Signed)
Copied from CRM [id number]. Topic: Clinical - Medical Advice >> Oct 06, 2023  9:18 AM Elizebeth Brooking wrote: Reason for CRM: Patient called in stating Ice cold sweats in his sleep wakes up soak and wet, but is cold no fever 3-4 weeks every now and then once or twice a week . Would like for a nurse to give him a callback regarding on what he should do

## 2023-10-06 NOTE — Telephone Encounter (Signed)
Contacted pt.   Advised him that matt would want to see him in office to evaluate and that I would not be able to give him advice on what to do regarding the cold sweats in his sleep.  Pt stated that he only talked to the nurse about his symptoms and stated that he will just ride it out. Says the sweats only happen once a week and believes it is due to his sugar levels.  Advised to pt to call and make appointment if symptoms worsen.  Pt agreed.

## 2023-10-09 ENCOUNTER — Telehealth: Payer: Self-pay | Admitting: Nurse Practitioner

## 2023-10-09 DIAGNOSIS — M2550 Pain in unspecified joint: Secondary | ICD-10-CM

## 2023-10-09 DIAGNOSIS — E782 Mixed hyperlipidemia: Secondary | ICD-10-CM

## 2023-10-09 DIAGNOSIS — F5101 Primary insomnia: Secondary | ICD-10-CM

## 2023-10-09 DIAGNOSIS — G540 Brachial plexus disorders: Secondary | ICD-10-CM

## 2023-10-09 MED ORDER — FLUTICASONE PROPIONATE 50 MCG/ACT NA SUSP: NASAL | 2 refills | Status: DC

## 2023-10-09 MED ORDER — ROSUVASTATIN CALCIUM 40 MG PO TABS: ORAL_TABLET | ORAL | 5 refills | Status: DC

## 2023-10-09 MED ORDER — MELOXICAM 7.5 MG PO TABS
7.5000 mg | ORAL_TABLET | Freq: Every day | ORAL | 1 refills | Status: DC
Start: 2023-10-09 — End: 2023-12-16

## 2023-10-09 MED ORDER — GABAPENTIN 300 MG PO CAPS: ORAL_CAPSULE | ORAL | 3 refills | Status: AC

## 2023-10-09 MED ORDER — TRAZODONE HCL 300 MG PO TABS: ORAL_TABLET | ORAL | 1 refills | Status: DC

## 2023-10-09 NOTE — Addendum Note (Signed)
Addended by: Eden Emms on: 10/09/2023 01:30 PM   Modules accepted: Orders

## 2023-10-09 NOTE — Telephone Encounter (Signed)
Prescription Request  10/09/2023  LOV: 10/01/2023  What is the name of the medication or equipment? trazodone (DESYREL) 300 MG tablet, rosuvastatin (CRESTOR) 40 MG tablet, meloxicam (MOBIC) 7.5 MG tablet, gabapentin (NEURONTIN) 300 MG capsule, fluticasone (FLONASE) 50 MCG/ACT nasal spray & econazole nitrate 1 % cream   Have you contacted your pharmacy to request a refill? Yes   Which pharmacy would you like this sent to?  [id number], [place] - [address] [place] [address] Phone: [phone] Fax: [phone]    Patient notified that their request is being sent to the clinical staff for review and that they should receive a response within 2 business days.   Please advise at Mobile [phone] (mobile)  Pt came by office requesting refills? [facility] & was told since Toney Reil is his new pcp, they'll need new rxs sent by [facility] for meds. Pt mentioned his old provider passed away.

## 2023-10-09 NOTE — Addendum Note (Signed)
Addended by: Donnamarie Poag on: 10/09/2023 01:01 PM   Modules accepted: Orders

## 2023-10-22 ENCOUNTER — Encounter: Payer: Self-pay | Admitting: *Deleted

## 2023-11-23 ENCOUNTER — Ambulatory Visit: Payer: Commercial Managed Care - HMO | Admitting: Internal Medicine

## 2023-12-16 ENCOUNTER — Other Ambulatory Visit: Payer: Self-pay | Admitting: Nurse Practitioner

## 2023-12-16 DIAGNOSIS — M2550 Pain in unspecified joint: Secondary | ICD-10-CM

## 2024-01-05 ENCOUNTER — Other Ambulatory Visit: Payer: Self-pay | Admitting: Nurse Practitioner

## 2024-01-14 ENCOUNTER — Other Ambulatory Visit: Payer: Self-pay | Admitting: Nurse Practitioner

## 2024-01-14 DIAGNOSIS — F5101 Primary insomnia: Secondary | ICD-10-CM

## 2024-02-18 ENCOUNTER — Other Ambulatory Visit: Payer: Self-pay | Admitting: Nurse Practitioner

## 2024-02-18 DIAGNOSIS — M2550 Pain in unspecified joint: Secondary | ICD-10-CM

## 2024-02-25 ENCOUNTER — Ambulatory Visit: Payer: Commercial Managed Care - HMO | Admitting: Nurse Practitioner

## 2024-02-25 ENCOUNTER — Other Ambulatory Visit: Payer: Self-pay | Admitting: Nurse Practitioner

## 2024-03-30 ENCOUNTER — Encounter: Payer: Self-pay | Admitting: Nurse Practitioner

## 2024-03-30 ENCOUNTER — Ambulatory Visit (INDEPENDENT_AMBULATORY_CARE_PROVIDER_SITE_OTHER): Payer: Medicaid Other | Admitting: Nurse Practitioner

## 2024-03-30 VITALS — BP 118/72 | HR 60 | Temp 97.6°F | Ht 68.0 in | Wt 233.0 lb

## 2024-03-30 DIAGNOSIS — E559 Vitamin D deficiency, unspecified: Secondary | ICD-10-CM | POA: Diagnosis not present

## 2024-03-30 DIAGNOSIS — N5089 Other specified disorders of the male genital organs: Secondary | ICD-10-CM | POA: Diagnosis not present

## 2024-03-30 DIAGNOSIS — Z862 Personal history of diseases of the blood and blood-forming organs and certain disorders involving the immune mechanism: Secondary | ICD-10-CM

## 2024-03-30 DIAGNOSIS — L989 Disorder of the skin and subcutaneous tissue, unspecified: Secondary | ICD-10-CM | POA: Diagnosis not present

## 2024-03-30 DIAGNOSIS — E66811 Obesity, class 1: Secondary | ICD-10-CM

## 2024-03-30 DIAGNOSIS — M79672 Pain in left foot: Secondary | ICD-10-CM | POA: Diagnosis not present

## 2024-03-30 DIAGNOSIS — Z87891 Personal history of nicotine dependence: Secondary | ICD-10-CM | POA: Insufficient documentation

## 2024-03-30 DIAGNOSIS — G540 Brachial plexus disorders: Secondary | ICD-10-CM | POA: Insufficient documentation

## 2024-03-30 DIAGNOSIS — F5101 Primary insomnia: Secondary | ICD-10-CM

## 2024-03-30 DIAGNOSIS — Z0001 Encounter for general adult medical examination with abnormal findings: Secondary | ICD-10-CM | POA: Diagnosis not present

## 2024-03-30 DIAGNOSIS — I10 Essential (primary) hypertension: Secondary | ICD-10-CM | POA: Diagnosis not present

## 2024-03-30 DIAGNOSIS — M79671 Pain in right foot: Secondary | ICD-10-CM | POA: Insufficient documentation

## 2024-03-30 DIAGNOSIS — Z125 Encounter for screening for malignant neoplasm of prostate: Secondary | ICD-10-CM

## 2024-03-30 DIAGNOSIS — R7303 Prediabetes: Secondary | ICD-10-CM

## 2024-03-30 DIAGNOSIS — E782 Mixed hyperlipidemia: Secondary | ICD-10-CM | POA: Diagnosis not present

## 2024-03-30 DIAGNOSIS — G4733 Obstructive sleep apnea (adult) (pediatric): Secondary | ICD-10-CM

## 2024-03-30 LAB — LIPID PANEL
Cholesterol: 92 mg/dL (ref 0–200)
HDL: 23.2 mg/dL — ABNORMAL LOW (ref >39.00)
LDL Cholesterol: 38 mg/dL (ref 0–99)
NonHDL: 68.97
Total CHOL/HDL Ratio: 4
Triglycerides: 153 mg/dL — ABNORMAL HIGH (ref 0.0–149.0)
VLDL: 30.6 mg/dL (ref 0.0–40.0)

## 2024-03-30 LAB — COMPREHENSIVE METABOLIC PANEL WITH GFR
ALT: 17 U/L (ref 0–53)
AST: 15 U/L (ref 0–37)
Albumin: 4.1 g/dL (ref 3.5–5.2)
Alkaline Phosphatase: 84 U/L (ref 39–117)
BUN: 10 mg/dL (ref 6–23)
CO2: 25 meq/L (ref 19–32)
Calcium: 8.7 mg/dL (ref 8.4–10.5)
Chloride: 106 meq/L (ref 96–112)
Creatinine, Ser: 1 mg/dL (ref 0.40–1.50)
GFR: 79.91 mL/min (ref >60.00)
Glucose, Bld: 102 mg/dL — ABNORMAL HIGH (ref 70–99)
Potassium: 4.1 meq/L (ref 3.5–5.1)
Sodium: 139 meq/L (ref 135–145)
Total Bilirubin: 0.3 mg/dL (ref 0.2–1.2)
Total Protein: 6.4 g/dL (ref 6.0–8.3)

## 2024-03-30 LAB — CBC WITH DIFFERENTIAL/PLATELET
Basophils Absolute: 0.1 K/uL (ref 0.0–0.1)
Basophils Relative: 1.3 % (ref 0.0–3.0)
Eosinophils Absolute: 0.3 K/uL (ref 0.0–0.7)
Eosinophils Relative: 3.4 % (ref 0.0–5.0)
HCT: 41.9 % (ref 39.0–52.0)
Hemoglobin: 14.1 g/dL (ref 13.0–17.0)
Lymphocytes Relative: 25 % (ref 12.0–46.0)
Lymphs Abs: 2.2 K/uL (ref 0.7–4.0)
MCHC: 33.6 g/dL (ref 30.0–36.0)
MCV: 88.4 fl (ref 78.0–100.0)
Monocytes Absolute: 0.7 K/uL (ref 0.1–1.0)
Monocytes Relative: 7.4 % (ref 3.0–12.0)
Neutro Abs: 5.6 K/uL (ref 1.4–7.7)
Neutrophils Relative %: 62.9 % (ref 43.0–77.0)
Platelets: 178 K/uL (ref 150.0–400.0)
RBC: 4.74 Mil/uL (ref 4.22–5.81)
RDW: 13.6 % (ref 11.5–15.5)
WBC: 8.8 K/uL (ref 4.0–10.5)

## 2024-03-30 LAB — URINALYSIS, MICROSCOPIC ONLY
RBC / HPF: NONE SEEN (ref 0–?)
WBC, UA: NONE SEEN (ref 0–?)

## 2024-03-30 LAB — PSA: PSA: 0.19 ng/mL (ref 0.10–4.00)

## 2024-03-30 LAB — VITAMIN D 25 HYDROXY (VIT D DEFICIENCY, FRACTURES): VITD: 91.67 ng/mL (ref 30.00–100.00)

## 2024-03-30 LAB — HEMOGLOBIN A1C: Hgb A1c MFr Bld: 6.3 % (ref 4.6–6.5)

## 2024-03-30 LAB — TSH: TSH: 1.35 u[IU]/mL (ref 0.35–5.50)

## 2024-03-30 NOTE — Assessment & Plan Note (Signed)
 Discussed age-appropriate immunizations and screening exams.  Did review patient's personal, surgical, social, family histories.  Patient up-to-date on all age-appropriate vaccinations like.  Can get Prevnar 20 and shingles at local pharmacy.  Patient up-to-date on CRC screening.  PSA for prostate cancer screening today.  Patient again advised at discharge about preventative healthcare maintenance with anticipatory guidance

## 2024-03-30 NOTE — Assessment & Plan Note (Signed)
 History of sleep apnea without CPAP use.  Patient has nonrestorative sleep encouraged CPAP use.

## 2024-03-30 NOTE — Assessment & Plan Note (Signed)
 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  Ambulatory referral to dermatology

## 2024-03-30 NOTE — Patient Instructions (Signed)
 Nice to see you today Follow up with me in 6 months  Call and setup the ultrasound at  [facility] - off [facility] [address] [place], [place] , [place] [address] [phone] Open 8am-5pm   Get the shingles vaccine and prevnar 20 at the local pharmacy

## 2024-03-30 NOTE — Assessment & Plan Note (Signed)
 History of same with ultrasound in the past.  Unable to review previous ultrasounds.  He is growing.  ultrasound ordered.  Patient given information to call and set up

## 2024-03-30 NOTE — Assessment & Plan Note (Signed)
 Historical diagnosis.  Patient currently maintained on lifestyle modifications only

## 2024-03-30 NOTE — Assessment & Plan Note (Signed)
 History of the same currently maintained on rosuvastatin  40 mg daily pending lipid panel today.

## 2024-03-30 NOTE — Assessment & Plan Note (Signed)
 History of same.  Patient request referral to podiatry. podiatry referral placed today

## 2024-03-30 NOTE — Assessment & Plan Note (Signed)
 History of same pending A1c

## 2024-03-30 NOTE — Progress Notes (Signed)
 Established Patient Office Visit  Subjective   Patient ID: [name], male    DOB: 28-Feb-1961  Age: 63 y.o. MRN: [id number]  Chief Complaint  Patient presents with   Annual Exam    HPI  HTN: Currently maintained on lifestyle modifications solely  OSA: History of the same with CPAP.  Patient tried CPAP without great resolved states he slept worse with the mask.  Also used patient you are not currently meeting any CPAP.  Patient was followed by neurology in the past  HLD: Currently maintained on rosuvastatin  40 mg daily  Insomnia: Patient currently maintained on 150 to 300 mg of trazodone  nightly as needed. HE WILL GO TO BED AROUND 11-1 AND GET UP AROUND 4-5.[name] does not feel rested   Neuropathy: Patient currently maintained on gabapentin  300 mg 3 times daily as needed  for complete physical and follow up of chronic conditions.  Immunizations: -Tetanus: Completed in 2016 -Influenza: Out of season -Shingles: discussed in office  -Pneumonia: needs prevnar   Diet: Fair diet. He is doing 2 meals and a small third meal. He does snack sometimes. He is doing 1-2 cups of coffe and decaf unsweet tea  Exercise: No regular exercise. He goes fishing, hunting and mowing grass  Eye exam: needs updating  Dental exam: dentures    Colonoscopy: Completed in 01/2022, repeat 5 years Lung Cancer Screening: NA  PSA: Due       Review of Systems  Constitutional:  Negative for chills and fever.  Respiratory:  Negative for shortness of breath.   Cardiovascular:  Negative for chest pain and leg swelling.  Gastrointestinal:  Negative for abdominal pain, blood in stool, constipation, diarrhea, nausea and vomiting.       BM dialy to every other day   Genitourinary:  Negative for dysuria and hematuria.  Neurological:  Positive for tingling. Negative for headaches.  Psychiatric/Behavioral:  Negative for hallucinations and suicidal ideas.       Objective:     BP 118/72   Pulse 60    Temp 97.6 F (36.4 C) (Oral)   Ht 5' 8 (1.727 m)   Wt 233 lb (105.7 kg)   SpO2 94%   BMI 35.43 kg/m    Physical Exam Vitals and nursing note reviewed. Exam conducted with a chaperone present devere Hummer, CMA).  Constitutional:      Appearance: Normal appearance.  HENT:     Right Ear: Tympanic membrane, ear canal and external ear normal.     Left Ear: Tympanic membrane, ear canal and external ear normal.     Mouth/Throat:     Mouth: Mucous membranes are moist.     Pharynx: Oropharynx is clear.  Eyes:     Extraocular Movements: Extraocular movements intact.     Pupils: Pupils are equal, round, and reactive to light.  Cardiovascular:     Rate and Rhythm: Normal rate and regular rhythm.     Pulses: Normal pulses.     Heart sounds: Normal heart sounds.  Pulmonary:     Effort: Pulmonary effort is normal.     Breath sounds: Normal breath sounds.  Abdominal:     General: Bowel sounds are normal. There is no distension.     Palpations: There is no mass.     Tenderness: There is no abdominal tenderness.     Hernia: No hernia is present.  Genitourinary:    Testes:        Left: Mass present. Tenderness not present.  Musculoskeletal:     Right lower leg: No edema.     Left lower leg: No edema.  Lymphadenopathy:     Cervical: No cervical adenopathy.  Skin:    General: Skin is warm.  Neurological:     General: No focal deficit present.     Mental Status: He is alert.     Deep Tendon Reflexes:     Reflex Scores:      Bicep reflexes are 2+ on the right side and 2+ on the left side.      Patellar reflexes are 2+ on the right side and 2+ on the left side.    Comments: Bilateral upper and lower extremity strength 5/5  Psychiatric:        Mood and Affect: Mood normal.        Behavior: Behavior normal.        Thought Content: Thought content normal.        Judgment: Judgment normal.      No results found for any visits on 03/30/24.    The ASCVD Risk score (Arnett  DK, et al., 2019) failed to calculate for the following reasons:   The valid total cholesterol range is 130 to 320 mg/dL    Assessment & Plan:   Problem List Items Addressed This Visit       Cardiovascular and Mediastinum   Essential hypertension   Relevant Orders   CBC with Differential/Platelet   Comprehensive metabolic panel with GFR   TSH     Respiratory   OSA on CPAP     Other   Hyperlipidemia, mixed   Relevant Orders   Lipid panel   Prediabetes   Relevant Orders   Hemoglobin A1c   Vitamin D  deficiency   Relevant Orders   VITAMIN D  25 Hydroxy (Vit-D Deficiency, Fractures)   Obesity (BMI 30.0-34.9)   Insomnia   History of ITP   Other Visit Diagnoses       Testicular mass    -  Primary   Relevant Orders   US  SCROTUM W/DOPPLER     Left foot pain       Relevant Orders   Ambulatory referral to Podiatry     Skin lesions       Relevant Orders   Ambulatory referral to Dermatology     Preventative health care         Screening for prostate cancer       Relevant Orders   PSA     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    Adina Crandall, NP

## 2024-03-30 NOTE — Assessment & Plan Note (Signed)
 History of same maintained on gabapentin  300 mg 3 times daily as needed.  Continue

## 2024-03-30 NOTE — Assessment & Plan Note (Signed)
 Pending A1c, TSH, lipid panel.

## 2024-03-30 NOTE — Assessment & Plan Note (Signed)
History of same.  Pending A1c

## 2024-03-30 NOTE — Assessment & Plan Note (Signed)
 Patient currently maintained on trazodone  150 to 300 mg nightly as needed.  Continue

## 2024-03-30 NOTE — Assessment & Plan Note (Addendum)
Pending CBC. 

## 2024-03-30 NOTE — Progress Notes (Signed)
 Established Patient Office Visit  Subjective   Patient ID: [name], male    DOB: 28-Feb-1961  Age: 63 y.o. MRN: [id number]  Chief Complaint  Patient presents with   Annual Exam    HPI  HTN: Currently maintained on lifestyle modifications solely  OSA: History of the same with CPAP.  Patient tried CPAP without great resolved states he slept worse with the mask.  Also used patient you are not currently meeting any CPAP.  Patient was followed by neurology in the past  HLD: Currently maintained on rosuvastatin  40 mg daily  Insomnia: Patient currently maintained on 150 to 300 mg of trazodone  nightly as needed. HE WILL GO TO BED AROUND 11-1 AND GET UP AROUND 4-5.[name] does not feel rested   Neuropathy: Patient currently maintained on gabapentin  300 mg 3 times daily as needed  for complete physical and follow up of chronic conditions.  Immunizations: -Tetanus: Completed in 2016 -Influenza: Out of season -Shingles: discussed in office  -Pneumonia: needs prevnar   Diet: Fair diet. He is doing 2 meals and a small third meal. He does snack sometimes. He is doing 1-2 cups of coffe and decaf unsweet tea  Exercise: No regular exercise. He goes fishing, hunting and mowing grass  Eye exam: needs updating  Dental exam: dentures    Colonoscopy: Completed in 01/2022, repeat 5 years Lung Cancer Screening: NA  PSA: Due       Review of Systems  Constitutional:  Negative for chills and fever.  Respiratory:  Negative for shortness of breath.   Cardiovascular:  Negative for chest pain and leg swelling.  Gastrointestinal:  Negative for abdominal pain, blood in stool, constipation, diarrhea, nausea and vomiting.       BM dialy to every other day   Genitourinary:  Negative for dysuria and hematuria.  Neurological:  Positive for tingling. Negative for headaches.  Psychiatric/Behavioral:  Negative for hallucinations and suicidal ideas.       Objective:     BP 118/72   Pulse 60    Temp 97.6 F (36.4 C) (Oral)   Ht 5' 8 (1.727 m)   Wt 233 lb (105.7 kg)   SpO2 94%   BMI 35.43 kg/m    Physical Exam Vitals and nursing note reviewed. Exam conducted with a chaperone present [name], CMA).  Constitutional:      Appearance: Normal appearance.  HENT:     Right Ear: Tympanic membrane, ear canal and external ear normal.     Left Ear: Tympanic membrane, ear canal and external ear normal.     Mouth/Throat:     Mouth: Mucous membranes are moist.     Pharynx: Oropharynx is clear.  Eyes:     Extraocular Movements: Extraocular movements intact.     Pupils: Pupils are equal, round, and reactive to light.  Cardiovascular:     Rate and Rhythm: Normal rate and regular rhythm.     Pulses: Normal pulses.     Heart sounds: Normal heart sounds.  Pulmonary:     Effort: Pulmonary effort is normal.     Breath sounds: Normal breath sounds.  Abdominal:     General: Bowel sounds are normal. There is no distension.     Palpations: There is no mass.     Tenderness: There is no abdominal tenderness.     Hernia: No hernia is present.  Genitourinary:    Testes:        Left: Mass present. Tenderness not present.  Musculoskeletal:     Right lower leg: No edema.     Left lower leg: No edema.  Lymphadenopathy:     Cervical: No cervical adenopathy.  Skin:    General: Skin is warm.  Neurological:     General: No focal deficit present.     Mental Status: He is alert.     Deep Tendon Reflexes:     Reflex Scores:      Bicep reflexes are 2+ on the right side and 2+ on the left side.      Patellar reflexes are 2+ on the right side and 2+ on the left side.    Comments: Bilateral upper and lower extremity strength 5/5  Psychiatric:        Mood and Affect: Mood normal.        Behavior: Behavior normal.        Thought Content: Thought content normal.        Judgment: Judgment normal.      No results found for any visits on 03/30/24.    The ASCVD Risk score (Arnett  DK, et al., 2019) failed to calculate for the following reasons:   The valid total cholesterol range is 130 to 320 mg/dL    Assessment & Plan:   Problem List Items Addressed This Visit       Cardiovascular and Mediastinum   Essential hypertension   Relevant Orders   CBC with Differential/Platelet   Comprehensive metabolic panel with GFR   TSH     Respiratory   OSA on CPAP     Other   Hyperlipidemia, mixed   Relevant Orders   Lipid panel   Prediabetes   Relevant Orders   Hemoglobin A1c   Vitamin D  deficiency   Relevant Orders   VITAMIN D  25 Hydroxy (Vit-D Deficiency, Fractures)   Obesity (BMI 30.0-34.9)   Insomnia   History of ITP   Other Visit Diagnoses       Testicular mass    -  Primary   Relevant Orders   US  SCROTUM W/DOPPLER     Left foot pain       Relevant Orders   Ambulatory referral to Podiatry     Skin lesions       Relevant Orders   Ambulatory referral to Dermatology     Preventative health care         Screening for prostate cancer       Relevant Orders   PSA     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    Shawn Crandall, NP

## 2024-03-30 NOTE — Assessment & Plan Note (Signed)
 Pending urine microscopy rule out microscopic hematuria

## 2024-04-01 ENCOUNTER — Ambulatory Visit: Payer: Self-pay | Admitting: Nurse Practitioner

## 2024-04-01 DIAGNOSIS — N5089 Other specified disorders of the male genital organs: Secondary | ICD-10-CM

## 2024-04-05 ENCOUNTER — Encounter: Payer: Self-pay | Admitting: Podiatry

## 2024-04-05 ENCOUNTER — Ambulatory Visit: Admitting: Podiatry

## 2024-04-05 ENCOUNTER — Ambulatory Visit (INDEPENDENT_AMBULATORY_CARE_PROVIDER_SITE_OTHER)

## 2024-04-05 DIAGNOSIS — M7752 Other enthesopathy of left foot: Secondary | ICD-10-CM | POA: Diagnosis not present

## 2024-04-05 DIAGNOSIS — M7751 Other enthesopathy of right foot: Secondary | ICD-10-CM

## 2024-04-05 DIAGNOSIS — M19071 Primary osteoarthritis, right ankle and foot: Secondary | ICD-10-CM

## 2024-04-06 DIAGNOSIS — M19071 Primary osteoarthritis, right ankle and foot: Secondary | ICD-10-CM | POA: Diagnosis not present

## 2024-04-06 MED ORDER — BETAMETHASONE SOD PHOS & ACET 6 (3-3) MG/ML IJ SUSP
3.0000 mg | Freq: Once | INTRAMUSCULAR | Status: AC
Start: 2024-04-06 — End: 2024-04-06
  Administered 2024-04-06: 3 mg via INTRA_ARTICULAR

## 2024-04-06 NOTE — Progress Notes (Signed)
 Chief Complaint  Patient presents with   Foot Pain    Pt is here due to bilateral foot pain and right ankle pain, states over 30+ years ago he crushed his right foot and ankle and has been in pain since, was seeing a orthopedic doctor for these issues was recommended to come here due to the constant pain.    HPI: 63 y.o. male presenting today as a new patient for evaluation of pain and tenderness to the right lower extremity.  Past Medical History:  Diagnosis Date   Acid reflux    Arthritis    Blood dyscrasia    ITP   Bruise 08/08/11   pt states brusing x 1 month   Chest pain 08/08/2011   a little in last month   Disorder of both ears 08/08/2011   blood in ears in past month - put on steroids, tubes stopped up   Fracture of left clavicle 02/08/2015   Hypercholesteremia    Hypertension, Labile 11/03/2013   no meds   Idiopathic thrombocytopenic purpura (ITP) (HCC)    Leg cramps 08/08/2011   bad in last 1.5 months   Morbid obesity (HCC)    Prediabetes    Sleep apnea    wears CPAP sometimes   Tick bites 08/08/11   60 to 70 ticks in last hunting season , last > 1 month    Past Surgical History:  Procedure Laterality Date   BACK SURGERY  07/2010   lumbar - 3 discs   FRACTURE SURGERY     right foot fracture, no surgery   LUMBAR DISC SURGERY  2011   ORIF CLAVICULAR FRACTURE Left 02/08/2015   Procedure: OPEN REDUCTION INTERNAL FIXATION (ORIF) LEFT CLAVICULAR FRACTURE;  Surgeon: Fonda Olmsted, MD;  Location: [facility];  Service: Orthopedics;  Laterality: Left;    Allergies  Allergen Reactions   Niacin Rash    Red rash and swelling   Niacin And Related Swelling    Red rash and swelling   Gemfibrozil    Penicillins Other (See Comments)    Unknown childhood allergy Received 2 Gms Ancef  with no obvious reaction   Diphenhydramine Rash     Physical Exam: General: The patient is alert and oriented x3 in no acute distress.  Dermatology: Skin is warm,  dry and supple bilateral lower extremities.   Vascular: Palpable pedal pulses bilaterally. Capillary refill within normal limits.  No appreciable edema.  No erythema.  Neurological: Grossly intact via light touch  Musculoskeletal Exam: Tenderness with palpation noted to the tibiotalar joint as well as the subtalar joint right.  Tenderness with range of motion as well.  There is some slight crepitus also noted with motion consistent with DJD/arthritis  Radiographic Exam RT foot and ankle 04/05/2024:  Normal osseous mineralization.  Joint space narrowing with degenerative changes noted to the midtarsal joints as well as the ankle joint right.  Assessment/Plan of Care: 1.  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  X-rays reviewed -I do believe the patient would benefit from surgery which would include a triple arthrodesis in combination with arthrodesis of the ankle versus ankle joint replacement.  Patient would be a great candidate for ankle joint replacement. -Patient will have Medicare next year which should likely cover the cost of the ankle joint replacement.  For now we will continue conservative management over the following year -Injection of 0.5 cc Celestone  Soluspan injected into the subtalar joint as well as the ankle joint  right -Currently takes meloxicam  15 mg daily.  Continue -Return to clinic 6 weeks   Thresa EMERSON Sar, DPM [facility]  Dr. Thresa EMERSON Sar, DPM    [address] [place], [place] [address]                Office [phone]  Fax [phone]

## 2024-04-08 ENCOUNTER — Ambulatory Visit
Admission: RE | Admit: 2024-04-08 | Discharge: 2024-04-08 | Disposition: A | Discharge status: Home / Self Care | Source: Ambulatory Visit | Attending: Nurse Practitioner | Admitting: Nurse Practitioner

## 2024-04-08 DIAGNOSIS — N5089 Other specified disorders of the male genital organs: Secondary | ICD-10-CM | POA: Diagnosis present

## 2024-04-11 ENCOUNTER — Telehealth: Payer: Self-pay | Admitting: Nurse Practitioner

## 2024-04-11 NOTE — Telephone Encounter (Signed)
 Needs referral to [facility]. On [address].

## 2024-04-12 NOTE — Telephone Encounter (Signed)
 Noted.  See referral for further updates.

## 2024-04-13 NOTE — Telephone Encounter (Signed)
 Spoke with pt notifying him his referral in progress and he will be contacted to get scheduled. Pt verbalizes understanding and expresses his thanks.

## 2024-04-25 NOTE — Telephone Encounter (Signed)
-----   Message from [facility] T sent at 04/25/2024 10:57 AM EDT ----- Called patient reviewed all information and repeated back to me. Will call if any questions.  Pt prefers [place] location Pt requests referral for [facility].  ----- Message ----- From: Wendee Lynwood HERO, NP Sent: 04/25/2024   7:11 AM EDT To: Wendee Gunnels   Can we review the ultrasound with the patient  Ultrasound shows several cyst on the left. If it is still bothering him we can go to urology for further evaluation and treatment. If he decides he would like to go find out if he wants [place] or  [place]  ----- Message ----- From: Interface, Rad Results In Sent: 04/22/2024   8:37 PM EDT To: Lynwood HERO Wendee, NP

## 2024-04-25 NOTE — Telephone Encounter (Signed)
 He should not need a referral for [facility]. He should be able to call and schedule  I have place urology referral

## 2024-04-29 ENCOUNTER — Encounter: Payer: Self-pay | Admitting: Urology

## 2024-05-11 ENCOUNTER — Ambulatory Visit

## 2024-05-11 DIAGNOSIS — L814 Other melanin hyperpigmentation: Secondary | ICD-10-CM | POA: Diagnosis not present

## 2024-05-11 DIAGNOSIS — L570 Actinic keratosis: Secondary | ICD-10-CM

## 2024-05-11 DIAGNOSIS — D1801 Hemangioma of skin and subcutaneous tissue: Secondary | ICD-10-CM

## 2024-05-11 DIAGNOSIS — L720 Epidermal cyst: Secondary | ICD-10-CM

## 2024-05-11 DIAGNOSIS — L729 Follicular cyst of the skin and subcutaneous tissue, unspecified: Secondary | ICD-10-CM

## 2024-05-11 DIAGNOSIS — D229 Melanocytic nevi, unspecified: Secondary | ICD-10-CM

## 2024-05-11 DIAGNOSIS — L821 Other seborrheic keratosis: Secondary | ICD-10-CM

## 2024-05-11 DIAGNOSIS — Z1283 Encounter for screening for malignant neoplasm of skin: Secondary | ICD-10-CM

## 2024-05-11 DIAGNOSIS — W908XXA Exposure to other nonionizing radiation, initial encounter: Secondary | ICD-10-CM

## 2024-05-11 DIAGNOSIS — L578 Other skin changes due to chronic exposure to nonionizing radiation: Secondary | ICD-10-CM

## 2024-05-11 NOTE — Progress Notes (Signed)
 Subjective   [name] is a 63 y.o. male who presents for the following: Lesion(s) of concern . Patient is new patient  Today patient reports: Several scaly spot on the face and ears. Patient would also like arms checked.  Review of Systems:    No other skin or systemic complaints except as noted in HPI or Assessment and Plan.  The following portions of the chart were reviewed this encounter and updated as appropriate: medications, allergies, medical history  Relevant Medical History:  n/a   Objective  Well appearing patient in no apparent distress; mood and affect are within normal limits. Examination was performed of the: Sun Exposed Exam: Scalp, head, eyes, ears, nose, lips, neck, upper extremities, hands, fingers, fingernails  Examination notable for: Angioma(s): Scattered red vascular papule(s)  , Lentigo/lentigines: Scattered pigmented macules that are tan to brown in color and are somewhat non-uniform in shape and concentrated in the sun-exposed areas, Nevus/nevi: Scattered well-demarcated, regular, pigmented macule(s) and/or papule(s)  , Seborrheic Keratosis(es): Stuck-on appearing keratotic papule(s) on the trunk, none  irritated with redness, crusting, edema, and/or partial avulsion, Actinic Damage/Elastosis: chronic sun damage: dyspigmentation, telangiectasia, and wrinkling, Milia: Multiple 2-3 mm white papules   - A firm, skin-colored, subcutaneous nodule that is freely movable and has a central punctum located L tragus  Examination limited by: Clothing   face, ears (6) Erythematous thin papules/macules with gritty scale.   Assessment & Plan   SKIN CANCER SCREENING PERFORMED TODAY.  BENIGN SKIN FINDINGS  - Lentigines  - Seborrheic keratoses  - Hemangiomas   - Nevus/Multiple Benign Nevi  - Milia  - Reassurance provided regarding the benign appearance of lesions noted on exam today; no treatment is indicated in the absence of symptoms/changes. - Reinforced  importance of photoprotective strategies including liberal and frequent sunscreen use of a broad-spectrum SPF 30 or greater, use of protective clothing, and sun avoidance for prevention of cutaneous malignancy and photoaging.  Counseled patient on the importance of regular self-skin monitoring as well as routine clinical skin examinations as scheduled.   ACTINIC DAMAGE - Chronic condition, secondary to cumulative UV/sun exposure - Recommend daily broad spectrum sunscreen SPF 30+ to sun-exposed areas, reapply every 2 hours as needed.  - Staying in the shade or wearing long sleeves, sun glasses (UVA+UVB protection) and wide brim hats (4-inch brim around the entire circumference of the hat) are also recommended for sun protection.  - Call for new or changing lesions.  Subcutaneous cyst at left tragus, favor EIC  - Explained to patient this most likely is consistent with an epidermal inclusion cyst, which represents trapped hair follicule and skin cells under the skin - Benign, patient reassured - Asymptomatic. Recommend patient see ENT if desires removal.    Procedures, orders, diagnosis for this visit:  AK (ACTINIC KERATOSIS) (6) face, ears (6) Actinic keratoses are precancerous spots that appear secondary to cumulative UV radiation exposure/sun exposure over time. They are chronic with expected duration over 1 year. A portion of actinic keratoses will progress to squamous cell carcinoma of the skin. It is not possible to reliably predict which spots will progress to skin cancer and so treatment is recommended to prevent development of skin cancer.  Recommend daily broad spectrum sunscreen SPF 30+ to sun-exposed areas, reapply every 2 hours as needed.  Recommend staying in the shade or wearing long sleeves, sun glasses (UVA+UVB protection) and wide brim hats (4-inch brim around the entire circumference of the hat). Call for new or  changing lesions. Destruction of lesion - face, ears  (6) Complexity: simple   Destruction method: cryotherapy   Informed consent: discussed and consent obtained   Timeout:  patient name, date of birth, surgical site, and procedure verified Lesion destroyed using liquid nitrogen: Yes   Region frozen until ice ball extended beyond lesion: Yes   Cryo cycles: 1 or 2. Outcome: patient tolerated procedure well with no complications   Post-procedure details: wound care instructions given    SKIN EXAM FOR MALIGNANT NEOPLASM   LENTIGO   SEBORRHEIC KERATOSIS   CHERRY ANGIOMA   MULTIPLE BENIGN NEVI   ACTINIC SKIN DAMAGE   ACTINIC KERATOSIS    AK (actinic keratosis) -     Destruction of lesion  Skin exam for malignant neoplasm  Lentigo  Seborrheic keratosis  Cherry angioma  Multiple benign nevi  Actinic skin damage  Actinic keratosis    Return to clinic: Return if symptoms worsen or fail to improve.  LILLETTE Andrea Kerns, CMA, am acting as scribe for Lauraine JAYSON Kanaris, MD .  Documentation: I have reviewed the above documentation for accuracy and completeness, and I agree with the above.  Lauraine JAYSON Kanaris, MD

## 2024-05-11 NOTE — Patient Instructions (Signed)

## 2024-05-17 ENCOUNTER — Ambulatory Visit: Admitting: Podiatry

## 2024-05-17 ENCOUNTER — Encounter: Payer: Self-pay | Admitting: Podiatry

## 2024-05-17 VITALS — Ht 68.0 in | Wt 233.0 lb

## 2024-05-17 DIAGNOSIS — M19071 Primary osteoarthritis, right ankle and foot: Secondary | ICD-10-CM | POA: Diagnosis not present

## 2024-05-17 MED ORDER — BETAMETHASONE SOD PHOS & ACET 6 (3-3) MG/ML IJ SUSP
3.0000 mg | Freq: Once | INTRAMUSCULAR | Status: AC
  Administered 2024-05-17: 3 mg via INTRA_ARTICULAR

## 2024-05-17 NOTE — Progress Notes (Signed)
 Chief Complaint  Patient presents with   Foot Pain    Pt is here to f/u on right foot pain, he states the pain is back, pain is more intense to the back of the heel.    HPI: 63 y.o. male presenting today for follow-up evaluation of arthritis to the right lower extremity.  Past Medical History:  Diagnosis Date   Acid reflux    Arthritis    Blood dyscrasia    ITP   Bruise 08/08/11   pt states brusing x 1 month   Chest pain 08/08/2011   a little in last month   Disorder of both ears 08/08/2011   blood in ears in past month - put on steroids, tubes stopped up   Fracture of left clavicle 02/08/2015   Hypercholesteremia    Hypertension, Labile 11/03/2013   no meds   Idiopathic thrombocytopenic purpura (ITP) (HCC)    Leg cramps 08/08/2011   bad in last 1.5 months   Morbid obesity (HCC)    Prediabetes    Sleep apnea    wears CPAP sometimes   Tick bites 08/08/11   60 to 70 ticks in last hunting season , last > 1 month    Past Surgical History:  Procedure Laterality Date   BACK SURGERY  07/2010   lumbar - 3 discs   FRACTURE SURGERY     right foot fracture, no surgery   LUMBAR DISC SURGERY  2011   ORIF CLAVICULAR FRACTURE Left 02/08/2015   Procedure: OPEN REDUCTION INTERNAL FIXATION (ORIF) LEFT CLAVICULAR FRACTURE;  Surgeon: Fonda Olmsted, MD;  Location: [facility];  Service: Orthopedics;  Laterality: Left;    Allergies  Allergen Reactions   Niacin Rash    Red rash and swelling   Niacin And Related Swelling    Red rash and swelling   Gemfibrozil    Penicillins Other (See Comments)    Unknown childhood allergy Received 2 Gms Ancef  with no obvious reaction   Diphenhydramine Rash     Physical Exam: General: The patient is alert and oriented x3 in no acute distress.  Dermatology: Skin is warm, dry and supple bilateral lower extremities.   Vascular: Palpable pedal pulses bilaterally. Capillary refill within normal limits.  No appreciable edema.   No erythema.  Neurological: Grossly intact via light touch  Musculoskeletal Exam: Tenderness with palpation noted to the tibiotalar joint as well as the subtalar joint right.  Tenderness with range of motion as well.  There is some slight crepitus also noted with motion consistent with DJD/arthritis  Radiographic Exam RT foot and ankle 04/05/2024:  Normal osseous mineralization.  Joint space narrowing with degenerative changes noted to the midtarsal joints as well as the ankle joint right.  Assessment/Plan of Care: 1.  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   -Injection of 0.5 cc Celestone  Soluspan injected around the right ankle and subtalar joint -Again we discussed surgery today.  I do believe the patient would benefit from surgery which would include a triple arthrodesis in combination with arthrodesis of the ankle versus ankle joint replacement.  Patient would be a great candidate for ankle joint replacement. -Patient will have Medicare next year which should likely cover the cost of the ankle joint replacement.  For now we will continue conservative management over the following year -Currently takes meloxicam  15 mg daily.  Continue -Return to clinic 6 weeks   Thresa EMERSON Sar, DPM [facility]  Dr.  Thresa EMERSON Sar, DPM    [address] [place], [place] [address]                Office [phone]  Fax [phone]

## 2024-05-20 ENCOUNTER — Encounter: Payer: Commercial Managed Care - HMO | Admitting: Internal Medicine

## 2024-05-20 ENCOUNTER — Ambulatory Visit: Admitting: Urology

## 2024-05-25 DIAGNOSIS — N4342 Spermatocele of epididymis, multiple: Secondary | ICD-10-CM | POA: Insufficient documentation

## 2024-05-25 NOTE — Progress Notes (Deleted)
   05/25/24 8:57 AM   [name] Mania 1960/12/11 [id number]   HPI: 63 y.o. 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    PMH: Past Medical History:  Diagnosis Date   Acid reflux    Arthritis    Blood dyscrasia    ITP   Bruise 08/08/11   pt states brusing x 1 month   Chest pain 08/08/2011   a little in last month   Disorder of both ears 08/08/2011   blood in ears in past month - put on steroids, tubes stopped up   Fracture of left clavicle 02/08/2015   Hypercholesteremia    Hypertension, Labile 11/03/2013   no meds   Idiopathic thrombocytopenic purpura (ITP) (HCC)    Leg cramps 08/08/2011   bad in last 1.5 months   Morbid obesity (HCC)    Prediabetes    Sleep apnea    wears CPAP sometimes   Tick bites 08/08/11   60 to 70 ticks in last hunting season , last > 1 month    Surgical History: Past Surgical History:  Procedure Laterality Date   BACK SURGERY  07/2010   lumbar - 3 discs   FRACTURE SURGERY     right foot fracture, no surgery   LUMBAR DISC SURGERY  2011   ORIF CLAVICULAR FRACTURE Left 02/08/2015   Procedure: OPEN REDUCTION INTERNAL FIXATION (ORIF) LEFT CLAVICULAR FRACTURE;  Surgeon: Fonda Olmsted, MD;  Location:  [facility]ERY [facility];  Service: Orthopedics;  Laterality: Left;    Family History: Family History  Problem Relation Age of Onset   Uterine cancer Mother    Alzheimer's disease Mother    Diabetes Sister    Uterine cancer Sister    Alzheimer's disease Sister    Depression Maternal Grandmother    Depression Paternal Grandmother    Stroke Neg Hx     Social History:  reports that he quit smoking about 9 years ago. His smoking use included cigarettes. He started smoking about 17 years ago. He has a 8 pack-year smoking history. He has quit using smokeless tobacco.  His smokeless tobacco use included chew. He reports that he does not currently use alcohol. He reports that  he does not use drugs.      Physical Exam: There were no vitals taken for this visit.   Constitutional:  Alert and oriented, No acute distress. Cardiovascular: No clubbing, cyanosis, or edema. Respiratory: Normal respiratory effort, no increased work of breathing. GI: Nondistended GU: *** Skin: No rashes, bruises or suspicious lesions. Neurologic: Grossly intact, no focal deficits, moving all 4 extremities. Psychiatric: Normal mood and affect.  Laboratory Data: N/A   Pertinent Imaging: I have personally viewed and interpreted the Scrotal US  (04/08/24) -multicystic left epididymal head cysts, measuring up to 3.3 cm.  Otherwise morphologically normal-appearing bilateral testes, no intratesticular masses.   IMPRESSION: 1. Negative for testicular torsion or intratesticular mass. 2. Multiple left epididymal cysts or spermatoceles, largest measuring 3.3 cm in the region of palpable concern. 3. Small bilateral hydroceles..    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

## 2024-05-25 NOTE — Assessment & Plan Note (Deleted)
 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

## 2024-05-26 ENCOUNTER — Other Ambulatory Visit: Payer: Self-pay | Admitting: Nurse Practitioner

## 2024-05-26 DIAGNOSIS — E782 Mixed hyperlipidemia: Secondary | ICD-10-CM

## 2024-05-29 ENCOUNTER — Other Ambulatory Visit: Payer: Self-pay | Admitting: Nurse Practitioner

## 2024-05-29 DIAGNOSIS — Z8659 Personal history of other mental and behavioral disorders: Secondary | ICD-10-CM

## 2024-05-29 DIAGNOSIS — F5104 Psychophysiologic insomnia: Secondary | ICD-10-CM

## 2024-05-30 ENCOUNTER — Encounter: Payer: Commercial Managed Care - HMO | Admitting: Internal Medicine

## 2024-06-02 ENCOUNTER — Ambulatory Visit: Admitting: Urology

## 2024-06-02 DIAGNOSIS — N4342 Spermatocele of epididymis, multiple: Secondary | ICD-10-CM

## 2024-06-28 ENCOUNTER — Ambulatory Visit: Admitting: Podiatry

## 2024-06-28 ENCOUNTER — Encounter: Payer: Self-pay | Admitting: Podiatry

## 2024-06-28 VITALS — Ht 68.0 in | Wt 233.0 lb

## 2024-06-28 DIAGNOSIS — M19071 Primary osteoarthritis, right ankle and foot: Secondary | ICD-10-CM

## 2024-06-28 MED ORDER — BETAMETHASONE SOD PHOS & ACET 6 (3-3) MG/ML IJ SUSP
3.0000 mg | Freq: Once | INTRAMUSCULAR | Status: AC
  Administered 2024-06-28: 3 mg via INTRA_ARTICULAR

## 2024-06-28 NOTE — Progress Notes (Signed)
 Chief Complaint  Patient presents with   Foot Pain    Pt is here to f/u on right foot pain, states the pain is slowly coming back.    HPI: 64 y.o. male presenting today for follow-up evaluation of arthritis to the right lower extremity.  Patient states that the injections do help temporarily.  He continues to take meloxicam  daily  Past Medical History:  Diagnosis Date   Acid reflux    Arthritis    Blood dyscrasia    ITP   Bruise 08/08/11   pt states brusing x 1 month   Chest pain 08/08/2011   a little in last month   Disorder of both ears 08/08/2011   blood in ears in past month - put on steroids, tubes stopped up   Fracture of left clavicle 02/08/2015   Hypercholesteremia    Hypertension, Labile 11/03/2013   no meds   Idiopathic thrombocytopenic purpura (ITP) (HCC)    Leg cramps 08/08/2011   bad in last 1.5 months   Morbid obesity (HCC)    Prediabetes    Sleep apnea    wears CPAP sometimes   Tick bites 08/08/11   60 to 70 ticks in last hunting season , last > 1 month    Past Surgical History:  Procedure Laterality Date   BACK SURGERY  07/2010   lumbar - 3 discs   FRACTURE SURGERY     right foot fracture, no surgery   LUMBAR DISC SURGERY  2011   ORIF CLAVICULAR FRACTURE Left 02/08/2015   Procedure: OPEN REDUCTION INTERNAL FIXATION (ORIF) LEFT CLAVICULAR FRACTURE;  Surgeon: Fonda Olmsted, MD;  Location: [facility];  Service: Orthopedics;  Laterality: Left;    Allergies  Allergen Reactions   Niacin Rash    Red rash and swelling   Niacin And Related Swelling    Red rash and swelling   Gemfibrozil    Penicillins Other (See Comments)    Unknown childhood allergy Received 2 Gms Ancef  with no obvious reaction   Diphenhydramine Rash     Physical Exam: General: The patient is alert and oriented x3 in no acute distress.  Dermatology: Skin is warm, dry and supple bilateral lower extremities.   Vascular: Palpable pedal pulses bilaterally.  Capillary refill within normal limits.  No appreciable edema.  No erythema.  Neurological: Grossly intact via light touch  Musculoskeletal Exam: Tenderness with palpation noted to the tibiotalar joint as well as the subtalar joint right.  Tenderness with range of motion as well.  There is some slight crepitus also noted with motion consistent with DJD/arthritis  Radiographic Exam RT foot and ankle 04/05/2024:  Normal osseous mineralization.  Joint space narrowing with degenerative changes noted to the midtarsal joints as well as the ankle joint right.  Assessment/Plan of Care: 1.  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   -Injection of 0.5 cc Celestone  Soluspan injected around the right ankle and subtalar joint - Surgery discussed again today.  Patient would benefit from triple arthrodesis with ankle joint arthroplasty with implant vs. ankle arthrodesis.  For now we are continuing to pursue conservative treatment management until he turns 64 yrs.  -Currently takes meloxicam  15 mg daily.  Continue -Return to clinic 6 weeks   Thresa EMERSON Sar, DPM [facility]  Dr. Thresa EMERSON Sar, DPM    [address]  [place], [place] [address]                Office [phone]  Fax [phone]

## 2024-07-13 NOTE — Assessment & Plan Note (Deleted)
 Scrotal ultrasound (04/22/2024)-multiple left epididymal cyst measuring up to 3.3 cm, small bilateral hydroceles

## 2024-07-13 NOTE — Progress Notes (Deleted)
   07/13/24 7:19 AM   [name] Mania 1961-04-09 [id number]   HPI: 63 y.o. male here for initial evaluation of left epididymal cyst  Scrotal ultrasound (04/22/2024)-multiple left epididymal cyst measuring up to 3.3 cm, small bilateral hydroceles   History of morbid obesity, OSA, hypertension, vertigo, PTSD, insomnia   PMH: Past Medical History:  Diagnosis Date   Acid reflux    Arthritis    Blood dyscrasia    ITP   Bruise 08/08/11   pt states brusing x 1 month   Chest pain 08/08/2011   a little in last month   Disorder of both ears 08/08/2011   blood in ears in past month - put on steroids, tubes stopped up   Fracture of left clavicle 02/08/2015   Hypercholesteremia    Hypertension, Labile 11/03/2013   no meds   Idiopathic thrombocytopenic purpura (ITP) (HCC)    Leg cramps 08/08/2011   bad in last 1.5 months   Morbid obesity (HCC)    Prediabetes    Sleep apnea    wears CPAP sometimes   Tick bites 08/08/11   60 to 70 ticks in last hunting season , last > 1 month    Surgical History: Past Surgical History:  Procedure Laterality Date   BACK SURGERY  07/2010   lumbar - 3 discs   FRACTURE SURGERY     right foot fracture, no surgery   LUMBAR DISC SURGERY  2011   ORIF CLAVICULAR FRACTURE Left 02/08/2015   Procedure: OPEN REDUCTION INTERNAL FIXATION (ORIF) LEFT CLAVICULAR FRACTURE;  Surgeon: Fonda Olmsted, MD;  Location: [facility];  Service: Orthopedics;  Laterality: Left;    Family History: Family History  Problem Relation Age of Onset   Uterine cancer Mother    Alzheimer's disease Mother    Diabetes Sister    Uterine cancer Sister    Alzheimer's disease Sister    Depression Maternal Grandmother    Depression Paternal Grandmother    Stroke Neg Hx     Social History:  reports that he quit smoking about 9 years ago. His smoking use included cigarettes. He started smoking about 17 years ago. He has a 8 pack-year smoking history. He has quit using  smokeless tobacco.  His smokeless tobacco use included chew. He reports that he does not currently use alcohol. He reports that he does not use drugs.      Physical Exam: There were no vitals taken for this visit.   Constitutional:  Alert and oriented, No acute distress. Cardiovascular: No clubbing, cyanosis, or edema. Respiratory: Normal respiratory effort, no increased work of breathing. GI: Nondistended GU: *** Skin: No rashes, bruises or suspicious lesions. Neurologic: Grossly intact, no focal deficits, moving all 4 extremities. Psychiatric: Normal mood and affect.  Laboratory Data: N/A   Pertinent Imaging: I have personally viewed and interpreted the Scrotal ultrasound (04/22/2024)-agree with read, multiple left epididymal cyst measuring up to 3.3 cm, small bilateral hydroceles.  Otherwise morphologically normal bilateral testes    Assessment & Plan:    Multiple spermatoceles of left epididymis Assessment & Plan: Scrotal ultrasound (04/22/2024)-multiple left epididymal cyst measuring up to 3.3 cm, small bilateral hydroceles       Penne Skye, MD 07/13/2024  [facility] [address], [address] [place], [place] [address] [phone]

## 2024-07-18 ENCOUNTER — Ambulatory Visit: Admitting: Urology

## 2024-07-18 DIAGNOSIS — N4342 Spermatocele of epididymis, multiple: Secondary | ICD-10-CM

## 2024-07-19 ENCOUNTER — Encounter: Payer: Self-pay | Admitting: Urology

## 2024-08-09 ENCOUNTER — Ambulatory Visit: Admitting: Podiatry

## 2024-08-09 ENCOUNTER — Encounter: Payer: Self-pay | Admitting: Podiatry

## 2024-08-09 VITALS — Ht 68.0 in | Wt 233.0 lb

## 2024-08-09 DIAGNOSIS — M19071 Primary osteoarthritis, right ankle and foot: Secondary | ICD-10-CM

## 2024-08-09 NOTE — Progress Notes (Signed)
 "  Chief Complaint  Patient presents with   Foot Pain    Pt is here to f/u on right foot due to arthritis.    HPI: 63 y.o. male presenting today for follow-up evaluation of arthritis to the right lower extremity.  Patient states that the injections do help temporarily.  He continues to take meloxicam  daily  Past Medical History:  Diagnosis Date   Acid reflux    Arthritis    Blood dyscrasia    ITP   Bruise 08/08/11   pt states brusing x 1 month   Chest pain 08/08/2011   a little in last month   Disorder of both ears 08/08/2011   blood in ears in past month - put on steroids, tubes stopped up   Fracture of left clavicle 02/08/2015   Hypercholesteremia    Hypertension, Labile 11/03/2013   no meds   Idiopathic thrombocytopenic purpura (ITP) (HCC)    Leg cramps 08/08/2011   bad in last 1.5 months   Morbid obesity (HCC)    Prediabetes    Sleep apnea    wears CPAP sometimes   Tick bites 08/08/11   60 to 70 ticks in last hunting season , last > 1 month    Past Surgical History:  Procedure Laterality Date   BACK SURGERY  07/2010   lumbar - 3 discs   FRACTURE SURGERY     right foot fracture, no surgery   LUMBAR DISC SURGERY  2011   ORIF CLAVICULAR FRACTURE Left 02/08/2015   Procedure: OPEN REDUCTION INTERNAL FIXATION (ORIF) LEFT CLAVICULAR FRACTURE;  Surgeon: Fonda Olmsted, MD;  Location: [facility];  Service: Orthopedics;  Laterality: Left;    Allergies  Allergen Reactions   Niacin Rash    Red rash and swelling   Niacin And Related Swelling    Red rash and swelling   Gemfibrozil    Penicillins Other (See Comments)    Unknown childhood allergy Received 2 Gms Ancef  with no obvious reaction   Diphenhydramine Rash     Physical Exam: General: The patient is alert and oriented x3 in no acute distress.  Dermatology: Skin is warm, dry and supple bilateral lower extremities.   Vascular: Palpable pedal pulses bilaterally. Capillary refill within normal  limits.  No appreciable edema.  No erythema.  Neurological: Grossly intact via light touch  Musculoskeletal Exam: Tenderness with palpation noted to the tibiotalar joint as well as the subtalar joint right.  Tenderness with range of motion as well.  There is some slight crepitus also noted with motion consistent with DJD/arthritis  Radiographic Exam RT foot and ankle 04/05/2024:  Normal osseous mineralization.  Joint space narrowing with degenerative changes noted to the midtarsal joints as well as the ankle joint right.  Assessment/Plan of Care: 1.  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   -Injection of 0.5 cc Celestone  Soluspan injected around the right ankle and subtalar joint - Surgery discussed again today.  Patient would benefit from triple arthrodesis with ankle joint arthroplasty with implant vs. ankle arthrodesis.  For now we are continuing to pursue conservative treatment management until he turns 64 yrs.  -Currently takes meloxicam  15 mg daily.  Continue -Return to clinic 6 weeks   Thresa EMERSON Sar, DPM [facility]  Dr. Thresa EMERSON Sar, DPM    [address]  [place], [place] [address]                Office [phone]  Fax [phone]     "

## 2024-08-15 ENCOUNTER — Other Ambulatory Visit: Payer: Self-pay | Admitting: Nurse Practitioner

## 2024-08-15 DIAGNOSIS — M2550 Pain in unspecified joint: Secondary | ICD-10-CM

## 2024-08-15 NOTE — Telephone Encounter (Signed)
Has appointment February

## 2024-08-16 DIAGNOSIS — M19071 Primary osteoarthritis, right ankle and foot: Secondary | ICD-10-CM | POA: Diagnosis not present

## 2024-08-16 MED ORDER — BETAMETHASONE SOD PHOS & ACET 6 (3-3) MG/ML IJ SUSP
3.0000 mg | Freq: Once | INTRAMUSCULAR | Status: AC
  Administered 2024-08-16: 3 mg via INTRA_ARTICULAR

## 2024-09-20 ENCOUNTER — Ambulatory Visit: Admitting: Podiatry

## 2024-09-20 VITALS — Ht 68.0 in | Wt 233.0 lb

## 2024-09-20 DIAGNOSIS — M19071 Primary osteoarthritis, right ankle and foot: Secondary | ICD-10-CM | POA: Diagnosis not present

## 2024-09-20 MED ORDER — DICLOFENAC SODIUM 75 MG PO TBEC: 75.0000 mg | DELAYED_RELEASE_TABLET | Freq: Two times a day (BID) | ORAL | 1 refills | Status: AC

## 2024-09-20 NOTE — Progress Notes (Signed)
 "  Chief Complaint  Patient presents with   Injections    Pt is here due to receive injection into right foot due to pain.    HPI: 64 y.o. male presenting today for follow-up evaluation of arthritis to the right lower extremity.  Patient states that the injections do help temporarily.  He continues to take meloxicam  daily  Past Medical History:  Diagnosis Date   Acid reflux    Arthritis    Blood dyscrasia    ITP   Bruise 08/08/11   pt states brusing x 1 month   Chest pain 08/08/2011   a little in last month   Disorder of both ears 08/08/2011   blood in ears in past month - put on steroids, tubes stopped up   Fracture of left clavicle 02/08/2015   Hypercholesteremia    Hypertension, Labile 11/03/2013   no meds   Idiopathic thrombocytopenic purpura (ITP) (HCC)    Leg cramps 08/08/2011   bad in last 1.5 months   Morbid obesity (HCC)    Prediabetes    Sleep apnea    wears CPAP sometimes   Tick bites 08/08/11   60 to 70 ticks in last hunting season , last > 1 month    Past Surgical History:  Procedure Laterality Date   BACK SURGERY  07/2010   lumbar - 3 discs   FRACTURE SURGERY     right foot fracture, no surgery   LUMBAR DISC SURGERY  2011   ORIF CLAVICULAR FRACTURE Left 02/08/2015   Procedure: OPEN REDUCTION INTERNAL FIXATION (ORIF) LEFT CLAVICULAR FRACTURE;  Surgeon: Fonda Olmsted, MD;  Location: [facility];  Service: Orthopedics;  Laterality: Left;    Allergies  Allergen Reactions   Niacin Rash    Red rash and swelling   Niacin And Related Swelling    Red rash and swelling   Gemfibrozil    Penicillins Other (See Comments)    Unknown childhood allergy Received 2 Gms Ancef  with no obvious reaction   Diphenhydramine Rash     Physical Exam: General: The patient is alert and oriented x3 in no acute distress.  Dermatology: Skin is warm, dry and supple bilateral lower extremities.   Vascular: Palpable pedal pulses bilaterally. Capillary  refill within normal limits.  No appreciable edema.  No erythema.  Neurological: Grossly intact via light touch  Musculoskeletal Exam: Tenderness with palpation noted to the tibiotalar joint as well as the subtalar joint right.  Tenderness with range of motion as well.  There is some slight crepitus also noted with motion consistent with DJD/arthritis  Radiographic Exam RT foot and ankle 04/05/2024:  Normal osseous mineralization.  Joint space narrowing with degenerative changes noted to the midtarsal joints as well as the ankle joint right.  Assessment/Plan of Care: 1.  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   -Injection of 0.5 cc Celestone  Soluspan injected around the right ankle and subtalar joint - Surgery discussed again today.  Patient would benefit from triple arthrodesis with ankle joint arthroplasty with implant vs. ankle arthrodesis.  For now we are continuing to pursue conservative treatment management until he turns 64 yrs.  -Currently takes meloxicam  15 mg daily.  Continue -Return to clinic 6 weeks   Thresa EMERSON Sar, DPM [facility]  Dr. Thresa EMERSON Sar, DPM    [address]  [place], [place] [address]                Office [phone]  Fax [phone]     "

## 2024-09-30 ENCOUNTER — Ambulatory Visit: Admitting: Nurse Practitioner

## 2024-11-01 ENCOUNTER — Ambulatory Visit: Admitting: Podiatry
